# Patient Record
Sex: Male | Born: 1950
Health system: Southern US, Community
[De-identification: ages and names within clinical notes are randomized; demographics above are authoritative.]

## PROBLEM LIST (undated history)

## (undated) DIAGNOSIS — K219 Gastro-esophageal reflux disease without esophagitis: Secondary | ICD-10-CM

## (undated) DIAGNOSIS — C4490 Unspecified malignant neoplasm of skin, unspecified: Secondary | ICD-10-CM

## (undated) DIAGNOSIS — Z8249 Family history of ischemic heart disease and other diseases of the circulatory system: Secondary | ICD-10-CM

## (undated) DIAGNOSIS — K449 Diaphragmatic hernia without obstruction or gangrene: Secondary | ICD-10-CM

## (undated) DIAGNOSIS — K4090 Unilateral inguinal hernia, without obstruction or gangrene, not specified as recurrent: Secondary | ICD-10-CM

## (undated) DIAGNOSIS — C20 Malignant neoplasm of rectum: Secondary | ICD-10-CM

## (undated) HISTORY — DX: Gastro-esophageal reflux disease without esophagitis: K21.9

## (undated) HISTORY — DX: Malignant neoplasm of rectum: C20

## (undated) HISTORY — DX: Unilateral inguinal hernia, without obstruction or gangrene, not specified as recurrent: K40.90

## (undated) HISTORY — DX: Diaphragmatic hernia without obstruction or gangrene: K44.9

## (undated) HISTORY — PX: CLAVICLE SURGERY: SHX598

---
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
## 2012-04-05 ENCOUNTER — Ambulatory Visit (INDEPENDENT_AMBULATORY_CARE_PROVIDER_SITE_OTHER): Payer: Commercial Indemnity | Admitting: Surgery

## 2012-04-05 ENCOUNTER — Encounter (INDEPENDENT_AMBULATORY_CARE_PROVIDER_SITE_OTHER): Payer: Self-pay | Admitting: Surgery

## 2012-04-05 VITALS — BP 142/89 | HR 72 | Temp 97.8°F | Resp 20 | Ht 77.0 in | Wt 213.1 lb

## 2012-04-05 DIAGNOSIS — K4090 Unilateral inguinal hernia, without obstruction or gangrene, not specified as recurrent: Secondary | ICD-10-CM

## 2012-04-05 NOTE — Progress Notes (Signed)
Subjective:     Patient ID: Jermaine Smith, male   DOB: 06-11-1951, 61 y.o.   MRN: [id number]  HPI  Jermaine Smith  [birth year] [id number]  Patient Care Team: Elias Else, MD as PCP - General (Family Medicine) Meryl Dare, MD,FACG as Consulting Physician (Gastroenterology)  This patient is a 61 y.o.male who presents today for surgical evaluation.   Reason for visit: Left inguinal hernia. Margin.  The patient is an active male. He had an inguinal hernia repair on the right side as a child. He is noted some intermittent swelling in his left groin for the past decade. Was told he had a mild hernia on that side. However more recently, he's noticed a constant bulge. It's worse by the end of the day. Reduces when he lies down. Because of more obvious lump and discomfort he wished to see a Careers adviser.   Usually very active. 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  No history of skin infections. He comes today with his wife  Patient Active Problem List  Diagnoses  . Left inguinal hernia    Past Medical History  Diagnosis Date  . Left inguinal hernia     Past Surgical History  Procedure Date  . Hernia repair 1961    open right inguinal age 40 y/o    History   Social History  . Marital Status: Married    Spouse Name: N/A    Number of Children: N/A  . Years of Education: N/A   Occupational History  .      Works on Hydrologist   Social History Main Topics  . Smoking status: Former Smoker    Quit date: 04/05/1977  . Smokeless tobacco: Never Used  . Alcohol Use: Yes     rarely  . Drug Use: No  . Sexually Active: Not on file   Other Topics Concern  . Not on file   Social History Narrative  . No narrative on file    No family history on file.  Current Outpatient Prescriptions  Medication Sig Dispense Refill  . dexlansoprazole (DEXILANT) 60 MG capsule Take 60 mg by mouth daily.         No Known  Allergies  BP 142/89  Pulse 72  Temp(Src) 97.8 F (36.6 C) (Temporal)  Resp 20  Ht 6\' 5"  (1.956 m)  Wt 213 lb 2 oz (96.673 kg)  BMI 25.27 kg/m2  No results found.   Review of Systems  Constitutional: Negative for fever, chills and diaphoresis.  HENT: Negative for nosebleeds, sore throat, facial swelling, mouth sores, trouble swallowing and ear discharge.   Eyes: Negative for photophobia, discharge and visual disturbance.  Respiratory: Negative for choking, chest tightness, shortness of breath and stridor.   Cardiovascular: Negative for chest pain and palpitations.       Can walk 20 miles in a day w/o problems  Gastrointestinal: Negative for nausea, vomiting, abdominal pain, diarrhea, constipation, blood in stool, abdominal distention, anal bleeding and rectal pain.       BM daily.  No personal nor family history of GI/colon cancer, inflammatory bowel disease, irritable bowel syndrome, allergy such as Celiac Sprue, dietary/dairy problems, colitis, ulcers nor gastritis.    No recent sick contacts/gastroenteritis.  No travel outside the country.  No changes in diet.    Genitourinary: Negative for dysuria, urgency, difficulty urinating and testicular pain.  Musculoskeletal: Negative for myalgias, back pain, arthralgias and gait problem.  Skin: Negative for color  change, pallor, rash and wound.  Neurological: Negative for dizziness, speech difficulty, weakness, numbness and headaches.  Hematological: Negative for adenopathy. Does not bruise/bleed easily.  Psychiatric/Behavioral: Negative for hallucinations, confusion and agitation.       Objective:   Physical Exam  Constitutional: He is oriented to person, place, and time. He appears well-developed and well-nourished. No distress.  HENT:  Head: Normocephalic.  Mouth/Throat: Oropharynx is clear and moist. No oropharyngeal exudate.  Eyes: Conjunctivae and EOM are normal. Pupils are equal, round, and reactive to light. No scleral  icterus.  Neck: Normal range of motion. Neck supple. No tracheal deviation present.  Cardiovascular: Normal rate, regular rhythm and intact distal pulses.   Pulmonary/Chest: Effort normal and breath sounds normal. No respiratory distress.  Abdominal: Soft. He exhibits no distension. There is no tenderness. Hernia confirmed negative in the right inguinal area and confirmed negative in the left inguinal area.  Genitourinary:     Musculoskeletal: Normal range of motion. He exhibits no tenderness.  Lymphadenopathy:    He has no cervical adenopathy.       Right: No inguinal adenopathy present.       Left: No inguinal adenopathy present.  Neurological: He is alert and oriented to person, place, and time. No cranial nerve deficit. He exhibits normal muscle tone. Coordination normal.  Skin: Skin is warm and dry. No rash noted. He is not diaphoretic. No erythema. No pallor.  Psychiatric: He has a normal mood and affect. His behavior is normal. Judgment and thought content normal.       Assessment:     LIH    Plan:     Lap LIH repair:  The anatomy & physiology of the abdominal wall and pelvic floor was discussed.  The pathophysiology of hernias in the inguinal and pelvic region was discussed.  Natural history risks such as progressive enlargement, pain, incarceration & strangulation was discussed.   Contributors to complications such as smoking, obesity, diabetes, prior surgery, etc were discussed.    I feel the risks of no intervention will lead to serious problems that outweigh the operative risks; therefore, I recommended surgery to reduce and repair the hernia.  I explained laparoscopic techniques with possible need for an open approach.  I noted usual use of mesh to patch and/or buttress hernia repair  Risks such as bleeding, infection, abscess, need for further treatment, heart attack, death, and other risks were discussed.  I noted a good likelihood this will help address the problem.    Goals of post-operative recovery were discussed as well.  Possibility that this will not correct all symptoms was explained.  I stressed the importance of low-impact activity, aggressive pain control, avoiding constipation, & not pushing through pain to minimize risk of post-operative chronic pain or injury. Possibility of reherniation was discussed.  We will work to minimize complications.     An educational handout further explaining the pathology & treatment options was given as well.  Questions were answered.  The patient expresses understanding & wishes to proceed with surgery.

## 2012-04-05 NOTE — Patient Instructions (Signed)
Inguinal Hernia, Adult  Muscles help keep everything in the body in its proper place. But if a weak spot in the muscles develops, something can poke through. That is called a hernia. When this happens in the lower part of the belly (abdomen), it is called an inguinal hernia. (It takes its name from a part of the body in this region called the inguinal canal.) A weak spot in the wall of muscles lets some fat or part of the small intestine bulge through. An inguinal hernia can develop at any age. Men get them more often than women.  CAUSES   In adults, an inguinal hernia develops over time.  · It can be triggered by:  · Suddenly straining the muscles of the lower abdomen.  · Lifting heavy objects.  · Straining to have a bowel movement. Difficult bowel movements (constipation) can lead to this.  · Constant coughing. This may be caused by smoking or lung disease.  · Being overweight.  · Being pregnant.  · Working at a job that requires long periods of standing or heavy lifting.  · Having had an inguinal hernia before.  One type can be an emergency situation. It is called a strangulated inguinal hernia. It develops if part of the small intestine slips through the weak spot and cannot get back into the abdomen. The blood supply can be cut off. If that happens, part of the intestine may die. This situation requires emergency surgery.  SYMPTOMS   Often, a small inguinal hernia has no symptoms. It is found when a healthcare provider does a physical exam. Larger hernias usually have symptoms.   · In adults, symptoms may include:  · A lump in the groin. This is easier to see when the person is standing. It might disappear when lying down.  · In men, a lump in the scrotum.  · Pain or burning in the groin. This occurs especially when lifting, straining or coughing.  · A dull ache or feeling of pressure in the groin.  · Signs of a strangulated hernia can include:  · A bulge in the groin that becomes very painful and tender to the  touch.  · A bulge that turns red or purple.  · Fever, nausea and vomiting.  · Inability to have a bowel movement or to pass gas.  DIAGNOSIS   To decide if you have an inguinal hernia, a healthcare provider will probably do a physical examination.  · This will include asking questions about any symptoms you have noticed.  · The healthcare provider might feel the groin area and ask you to cough. If an inguinal hernia is felt, the healthcare provider may try to slide it back into the abdomen.  · Usually no other tests are needed.  TREATMENT   Treatments can vary. The size of the hernia makes a difference. Options include:  · Watchful waiting. This is often suggested if the hernia is small and you have had no symptoms.  · No medical procedure will be done unless symptoms develop.  · You will need to watch closely for symptoms. If any occur, contact your healthcare provider right away.  · Surgery. This is used if the hernia is larger or you have symptoms.  · Open surgery. This is usually an outpatient procedure (you will not stay overnight in a hospital). An cut (incision) is made through the skin in the groin. The hernia is put back inside the abdomen. The weak area in the muscles is   then repaired by herniorrhaphy or hernioplasty. Herniorrhaphy: in this type of surgery, the weak muscles are sewn back together. Hernioplasty: a patch or mesh is used to close the weak area in the abdominal wall.  · Laparoscopy. In this procedure, a surgeon makes small incisions. A thin tube with a tiny video camera (called a laparoscope) is put into the abdomen. The surgeon repairs the hernia with mesh by looking with the video camera and using two long instruments.  HOME CARE INSTRUCTIONS   · After surgery to repair an inguinal hernia:  · You will need to take pain medicine prescribed by your healthcare provider. Follow all directions carefully.  · You will need to take care of the wound from the incision.  · Your activity will be  restricted for awhile. This will probably include no heavy lifting for several weeks. You also should not do anything too active for a few weeks. When you can return to work will depend on the type of job that you have.  · During "watchful waiting" periods, you should:  · Maintain a healthy weight.  · Eat a diet high in fiber (fruits, vegetables and whole grains).  · Drink plenty of fluids to avoid constipation. This means drinking enough water and other liquids to keep your urine clear or pale yellow.  · Do not lift heavy objects.  · Do not stand for long periods of time.  · Quit smoking. This should keep you from developing a frequent cough.  SEEK MEDICAL CARE IF:   · A bulge develops in your groin area.  · You feel pain, a burning sensation or pressure in the groin. This might be worse if you are lifting or straining.  · You develop a fever of more than 100.5° F (38.1° C).  SEEK IMMEDIATE MEDICAL CARE IF:   · Pain in the groin increases suddenly.  · A bulge in the groin gets bigger suddenly and does not go down.  · For men, there is sudden pain in the scrotum. Or, the size of the scrotum increases.  · A bulge in the groin area becomes red or purple and is painful to touch.  · You have nausea or vomiting that does not go away.  · You feel your heart beating much faster than normal.  · You cannot have a bowel movement or pass gas.  · You develop a fever of more than 102.0° F (38.9° C).  Document Released: 03/20/2009 Document Revised: 10/21/2011 Document Reviewed: 03/20/2009  Exit[employer]® Patient Information ©2012 [employer].

## 2012-05-09 DIAGNOSIS — K4020 Bilateral inguinal hernia, without obstruction or gangrene, not specified as recurrent: Secondary | ICD-10-CM

## 2012-05-09 HISTORY — PX: OTHER SURGICAL HISTORY: SHX169

## 2012-05-29 ENCOUNTER — Ambulatory Visit (INDEPENDENT_AMBULATORY_CARE_PROVIDER_SITE_OTHER): Payer: Commercial Indemnity | Admitting: Surgery

## 2012-05-29 ENCOUNTER — Encounter (INDEPENDENT_AMBULATORY_CARE_PROVIDER_SITE_OTHER): Payer: Self-pay | Admitting: Surgery

## 2012-05-29 VITALS — BP 142/84 | HR 82 | Resp 16 | Ht 77.0 in | Wt 213.0 lb

## 2012-05-29 DIAGNOSIS — K4090 Unilateral inguinal hernia, without obstruction or gangrene, not specified as recurrent: Secondary | ICD-10-CM

## 2012-05-29 DIAGNOSIS — K4091 Unilateral inguinal hernia, without obstruction or gangrene, recurrent: Secondary | ICD-10-CM

## 2012-05-29 NOTE — Patient Instructions (Signed)

## 2012-05-29 NOTE — Progress Notes (Signed)
Subjective:     Patient ID: Jermaine Smith, male   DOB: [birth year], 61 y.o.   MRN: [id number]  HPI  Jermaine Smith  10-06-1951 [id number]  Patient Care Team: Elias Else, MD as PCP - General (Family Medicine) Meryl Dare, MD,FACG as Consulting Physician (Gastroenterology)  This patient is a 61 y.o.male who presents today for surgical evaluation.   Procedure: Laparoscopic bilateral inguinal hernia repairs 05/09/2012  The patient comes in today feeling well.  He had a small recurrence on the right side in addition to a pantaloon-type left inguinal hernia.  He noted only mild soreness.  Oxycodone worked well for the first few days.  However, he didn't like being on it, so he own weaned himself off.  Back to walking regularly.  Trying to avoid heavy lifting.  Urinating fine.  Moving bowels well.  He is happy that recovery has been rather smooth.  In good spirits  Patient Active Problem List  Diagnosis  . Left inguinal hernia  . Recurrent right inguinal hernia    Past Medical History  Diagnosis Date  . Left inguinal hernia     Past Surgical History  Procedure Date  . Hernia repair 1961    open right inguinal age 61 y/o  . Lap bilateral ing. hernia repair 05/09/12    History   Social History  . Marital Status: Married    Spouse Name: N/A    Number of Children: N/A  . Years of Education: N/A   Occupational History  .      Works on Hydrologist   Social History Main Topics  . Smoking status: Former Smoker    Quit date: 04/05/1977  . Smokeless tobacco: Never Used  . Alcohol Use: Yes     rarely  . Drug Use: No  . Sexually Active: Not on file   Other Topics Concern  . Not on file   Social History Narrative  . No narrative on file    History reviewed. No pertinent family history.  Current Outpatient Prescriptions  Medication Sig Dispense Refill  . dexlansoprazole (DEXILANT) 60 MG capsule Take 60 mg by mouth daily.         No Known  Allergies  BP 142/84  Pulse 82  Resp 16  Ht 6\' 5"  (1.956 m)  Wt 213 lb (96.616 kg)  BMI 25.26 kg/m2  No results found.  Review of Systems  Constitutional: Negative for fever, chills and diaphoresis.  HENT: Negative for sore throat, trouble swallowing and neck pain.   Eyes: Negative for photophobia and visual disturbance.  Respiratory: Negative for choking and shortness of breath.   Cardiovascular: Negative for chest pain and palpitations.  Gastrointestinal: Negative for nausea, vomiting, abdominal distention, anal bleeding and rectal pain.  Genitourinary: Negative for dysuria, urgency, difficulty urinating and testicular pain.  Musculoskeletal: Negative for myalgias, arthralgias and gait problem.  Skin: Negative for color change and rash.  Neurological: Negative for dizziness, speech difficulty, weakness and numbness.  Hematological: Negative for adenopathy.  Psychiatric/Behavioral: Negative for hallucinations, confusion and agitation.       Objective:   Physical Exam  Constitutional: He is oriented to person, place, and time. He appears well-developed and well-nourished. No distress.  HENT:  Head: Normocephalic.  Mouth/Throat: Oropharynx is clear and moist. No oropharyngeal exudate.  Eyes: Conjunctivae and EOM are normal. Pupils are equal, round, and reactive to light. No scleral icterus.  Neck: Normal range of motion. No tracheal deviation present.  Cardiovascular: Normal rate, normal heart  sounds and intact distal pulses.   Pulmonary/Chest: Effort normal. No respiratory distress.  Abdominal: Soft. He exhibits no distension. There is no tenderness. Hernia confirmed negative in the right inguinal area and confirmed negative in the left inguinal area.       Incisions clean with normal healing ridges.  No hernias  Musculoskeletal: Normal range of motion. He exhibits no tenderness.  Neurological: He is alert and oriented to person, place, and time. No cranial nerve deficit. He  exhibits normal muscle tone. Coordination normal.  Skin: Skin is warm and dry. No rash noted. He is not diaphoretic.  Psychiatric: He has a normal mood and affect. His behavior is normal.       Smiling, amiable       Assessment:     Almost 3 weeks s/p lap BIH repairs w mesh, recovering well    Plan:     Increase activity as tolerated.  Do not push through pain.  Advanced on diet as tolerated. Bowel regimen to avoid problems.  Return to clinic p.r.n. The patient expressed understanding and appreciation

## 2015-12-01 ENCOUNTER — Encounter: Payer: Self-pay | Admitting: Gastroenterology

## 2016-01-21 ENCOUNTER — Encounter: Payer: Self-pay | Admitting: Gastroenterology

## 2016-01-21 ENCOUNTER — Ambulatory Visit (INDEPENDENT_AMBULATORY_CARE_PROVIDER_SITE_OTHER): Payer: BLUE CROSS/BLUE SHIELD | Admitting: Gastroenterology

## 2016-01-21 VITALS — BP 136/84 | HR 80 | Ht 76.0 in | Wt 211.2 lb

## 2016-01-21 DIAGNOSIS — K921 Melena: Secondary | ICD-10-CM

## 2016-01-21 MED ORDER — NA SULFATE-K SULFATE-MG SULF 17.5-3.13-1.6 GM/177ML PO SOLN: 1.0000 | Freq: Once | ORAL | Status: DC

## 2016-01-21 NOTE — Progress Notes (Signed)
    History of Present Illness: This is a 65 year old male self referred for the evaluation of rectal bleeding. He is accompanied by his wife. 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 For the past several months the patient has noted intermittent small amounts of bright red blood on the tissue paper or on the stool or in the commode. He relates no change in bowel habits and has no other GI complaints except for occasional mild constipation. He has not previously had colonoscopy. Patient underwent hernia repair in 2013 by Dr. Johney Maine and the patient is concerned about the mesh that was used during surgery. Denies weight loss, abdominal pain, constipation, diarrhea, change in stool caliber, melena, nausea, vomiting, dysphagia, reflux symptoms, chest pain.  Review of Systems: Pertinent positive and negative review of systems were noted in the above HPI section. All other review of systems were otherwise negative.  Current Medications, Allergies, Past Medical History, Past Surgical History, Family History and Social History were reviewed in [employer] record.  Physical Exam: General: Well developed, well nourished, no acute distress Head: Normocephalic and atraumatic Eyes:  sclerae anicteric, EOMI Ears: Normal auditory acuity Mouth: No deformity or lesions Neck: Supple, no masses or thyromegaly Lungs: Clear throughout to auscultation Heart: Regular rate and rhythm; no murmurs, rubs or bruits Abdomen: Soft, non tender and non distended. No masses, hepatosplenomegaly or hernias noted. Normal Bowel sounds Rectal: deffered to colonoscopy  Musculoskeletal: Symmetrical with no gross deformities  Skin: No lesions on visible extremities Pulses:  Normal pulses noted Extremities: No clubbing, cyanosis, edema or deformities noted Neurological: Alert oriented x 4, grossly nonfocal Cervical Nodes:  No significant cervical adenopathy Inguinal Nodes: No significant  inguinal adenopathy Psychological:  Alert and cooperative. Normal mood and affect  Assessment and Recommendations:  1. Hematochezia, small-volume. Suspected benign source such as internal hemorrhoids however colorectal neoplasms and other disorders need to be excluded. Schedule colonoscopy. The risks (including bleeding, perforation, infection, missed lesions, medication reactions and possible hospitalization or surgery if complications occur), benefits, and alternatives to colonoscopy with possible biopsy and possible polypectomy were discussed with the patient and they consent to proceed.   2. Status post inguinal hernia repair with mesh. I advised him to contact Dr. Johney Maine for any questions about this procedure or mesh.

## 2016-01-21 NOTE — Patient Instructions (Signed)
You have been scheduled for a colonoscopy. Please follow written instructions given to you at your visit today.  Please pick up your prep supplies at the pharmacy within the next 1-3 days. If you use inhalers (even only as needed), please bring them with you on the day of your procedure. Your physician has requested that you go to www.startemmi.com and enter the access code given to you at your visit today. This web site gives a general overview about your procedure. However, you should still follow specific instructions given to you by our office regarding your preparation for the procedure.  Thank you for choosing me and [facility].  Pricilla Riffle. Dagoberto Ligas., MD., Marval Regal  cc: Maury Dus, MD

## 2016-02-06 ENCOUNTER — Telehealth: Payer: Self-pay | Admitting: Gastroenterology

## 2016-02-06 NOTE — Telephone Encounter (Signed)
Left a message for patient to return my call. 

## 2016-02-09 NOTE — Telephone Encounter (Signed)
Spoke Gwyndolyn Saxon (patient's wife) and informed her when we get a free sample of Suprep in our office, I will call her and she can come by and pick it up since the procedure isn't until 03/19/16. Pt verbalized understanding.

## 2016-02-09 NOTE — Telephone Encounter (Signed)
Left a message for patient to return my call. 

## 2016-03-16 ENCOUNTER — Telehealth: Payer: Self-pay

## 2016-03-16 NOTE — Telephone Encounter (Signed)
Spoke with patient and told him that I would put a suprep sample up front for him to be picked up.  Patient agreed

## 2016-03-19 ENCOUNTER — Other Ambulatory Visit (INDEPENDENT_AMBULATORY_CARE_PROVIDER_SITE_OTHER): Payer: BLUE CROSS/BLUE SHIELD

## 2016-03-19 ENCOUNTER — Other Ambulatory Visit: Payer: Self-pay | Admitting: *Deleted

## 2016-03-19 ENCOUNTER — Other Ambulatory Visit: Payer: Self-pay

## 2016-03-19 ENCOUNTER — Encounter: Payer: Self-pay | Admitting: Gastroenterology

## 2016-03-19 ENCOUNTER — Ambulatory Visit (AMBULATORY_SURGERY_CENTER): Payer: BLUE CROSS/BLUE SHIELD | Admitting: Gastroenterology

## 2016-03-19 VITALS — BP 134/84 | HR 61 | Temp 98.6°F | Resp 11 | Ht 76.0 in | Wt 211.0 lb

## 2016-03-19 DIAGNOSIS — D125 Benign neoplasm of sigmoid colon: Secondary | ICD-10-CM | POA: Diagnosis not present

## 2016-03-19 DIAGNOSIS — K6289 Other specified diseases of anus and rectum: Secondary | ICD-10-CM

## 2016-03-19 DIAGNOSIS — D6489 Other specified anemias: Secondary | ICD-10-CM

## 2016-03-19 DIAGNOSIS — C20 Malignant neoplasm of rectum: Secondary | ICD-10-CM

## 2016-03-19 DIAGNOSIS — R198 Other specified symptoms and signs involving the digestive system and abdomen: Secondary | ICD-10-CM | POA: Diagnosis not present

## 2016-03-19 DIAGNOSIS — K921 Melena: Secondary | ICD-10-CM | POA: Diagnosis present

## 2016-03-19 DIAGNOSIS — C210 Malignant neoplasm of anus, unspecified: Secondary | ICD-10-CM | POA: Diagnosis not present

## 2016-03-19 LAB — CBC WITH DIFFERENTIAL/PLATELET
Basophils Absolute: 0 K/uL (ref 0.0–0.1)
Basophils Relative: 0.6 % (ref 0.0–3.0)
Eosinophils Absolute: 0.1 K/uL (ref 0.0–0.7)
Eosinophils Relative: 1.4 % (ref 0.0–5.0)
HCT: 35 % — ABNORMAL LOW (ref 39.0–52.0)
Hemoglobin: 11.9 g/dL — ABNORMAL LOW (ref 13.0–17.0)
Lymphocytes Relative: 16.5 % (ref 12.0–46.0)
Lymphs Abs: 0.9 K/uL (ref 0.7–4.0)
MCHC: 34.1 g/dL (ref 30.0–36.0)
MCV: 79.4 fl (ref 78.0–100.0)
Monocytes Absolute: 0.6 K/uL (ref 0.1–1.0)
Monocytes Relative: 10.4 % (ref 3.0–12.0)
Neutro Abs: 3.8 K/uL (ref 1.4–7.7)
Neutrophils Relative %: 71.1 % (ref 43.0–77.0)
Platelets: 207 K/uL (ref 150.0–400.0)
RBC: 4.4 Mil/uL (ref 4.22–5.81)
RDW: 13.8 % (ref 11.5–15.5)
WBC: 5.4 K/uL (ref 4.0–10.5)

## 2016-03-19 LAB — IBC PANEL
Iron: 44 ug/dL (ref 42–165)
Saturation Ratios: 10.4 % — ABNORMAL LOW (ref 20.0–50.0)
Transferrin: 303 mg/dL (ref 212.0–360.0)

## 2016-03-19 LAB — FERRITIN: FERRITIN: 7 ng/mL — AB (ref 22.0–322.0)

## 2016-03-19 LAB — COMPREHENSIVE METABOLIC PANEL WITH GFR
ALT: 14 U/L (ref 0–53)
AST: 18 U/L (ref 0–37)
Albumin: 4.1 g/dL (ref 3.5–5.2)
Alkaline Phosphatase: 68 U/L (ref 39–117)
BUN: 14 mg/dL (ref 6–23)
CO2: 28 meq/L (ref 19–32)
Calcium: 9.5 mg/dL (ref 8.4–10.5)
Chloride: 105 meq/L (ref 96–112)
Creatinine, Ser: 1.12 mg/dL (ref 0.40–1.50)
GFR: 70.21 mL/min
Glucose, Bld: 93 mg/dL (ref 70–99)
Potassium: 4.6 meq/L (ref 3.5–5.1)
Sodium: 140 meq/L (ref 135–145)
Total Bilirubin: 0.9 mg/dL (ref 0.2–1.2)
Total Protein: 6.7 g/dL (ref 6.0–8.3)

## 2016-03-19 LAB — FOLATE: Folate: 16.5 ng/mL (ref >5.9)

## 2016-03-19 LAB — VITAMIN B12: Vitamin B-12: 259 pg/mL (ref 211–911)

## 2016-03-19 MED ORDER — SODIUM CHLORIDE 0.9 % IV SOLN: 500.0000 mL | INTRAVENOUS | Status: DC

## 2016-03-19 NOTE — Patient Instructions (Signed)
YOU HAD AN ENDOSCOPIC PROCEDURE TODAY AT [facility]:   Refer to the procedure report that was given to you for any specific questions about what was found during the examination.  If the procedure report does not answer your questions, please call your gastroenterologist to clarify.  If you requested that your care partner not be given the details of your procedure findings, then the procedure report has been included in a sealed envelope for you to review at your convenience later.  YOU SHOULD EXPECT: Some feelings of bloating in the abdomen. Passage of more gas than usual.  Walking can help get rid of the air that was put into your GI tract during the procedure and reduce the bloating. If you had a lower endoscopy (such as a colonoscopy or flexible sigmoidoscopy) you may notice spotting of blood in your stool or on the toilet paper. If you underwent a bowel prep for your procedure, you may not have a normal bowel movement for a few days.  Please Note:  You might notice some irritation and congestion in your nose or some drainage.  This is from the oxygen used during your procedure.  There is no need for concern and it should clear up in a day or so.  SYMPTOMS TO REPORT IMMEDIATELY:   Following lower endoscopy (colonoscopy or flexible sigmoidoscopy):  Excessive amounts of blood in the stool  Significant tenderness or worsening of abdominal pains  Swelling of the abdomen that is new, acute  Fever of 100F or higher   For urgent or emergent issues, a gastroenterologist can be reached at any hour by calling [phone].   DIET: Your first meal following the procedure should be a small meal and then it is ok to progress to your normal diet. Heavy or fried foods are harder to digest and may make you feel nauseous or bloated.  Likewise, meals heavy in dairy and vegetables can increase bloating.  Drink plenty of fluids but you should avoid alcoholic beverages for 24  hours.  ACTIVITY:  You should plan to take it easy for the rest of today and you should NOT DRIVE or use heavy machinery until tomorrow (because of the sedation medicines used during the test).    FOLLOW UP: Our staff will call the number listed on your records the next business day following your procedure to check on you and address any questions or concerns that you may have regarding the information given to you following your procedure. If we do not reach you, we will leave a message.  However, if you are feeling well and you are not experiencing any problems, there is no need to return our call.  We will assume that you have returned to your regular daily activities without incident.  If any biopsies were taken you will be contacted by phone or by letter within the next 1-3 weeks.  Please call us at [phone] if you have not heard about the biopsies in 3 weeks.    SIGNATURES/CONFIDENTIALITY: You and/or your care partner have signed paperwork which will be entered into your electronic medical record.  These signatures attest to the fact that that the information above on your After Visit Summary has been reviewed and is understood.  Full responsibility of the confidentiality of this discharge information lies with you and/or your care-partner.  Polyp, hemorrhoid information given.  Contrast given for CT scan.  Dr. Lynne Leader nurse will be in touch about scheduling.  Labs today -CMP,  CBC, CEA  Dr. Lynne Leader office will get you a feferral to oncologist.

## 2016-03-19 NOTE — Progress Notes (Signed)
Per procedure report today patient needs scheduled for CT scan abd/pelvis, appt with [facility], and oncology referral. CT scan is scheduled for 03/23/16 1:30.  He verbalized understanding of CT instructions [facility] referral is scheduled with Dr. Marcello Moores for 03/30/1709:00 arrival He is aware that he will be contacted directly by Oncology next week with an apt

## 2016-03-19 NOTE — Progress Notes (Signed)
Report to PACU, RN, vss, BBS= Clear.  

## 2016-03-19 NOTE — Progress Notes (Signed)
Called to room to assist during endoscopic procedure.  Patient ID and intended procedure confirmed with present staff. Received instructions for my participation in the procedure from the performing physician.  

## 2016-03-19 NOTE — Op Note (Signed)
[facility] Patient Name: Jermaine Smith Procedure Date: 03/19/2016 9:10 AM MRN: [id number] Endoscopist: Ladene Artist , MD Age: 65 Date of Birth: [birth year] Gender: Male Procedure:                Colonoscopy Indications:              Evaluation of unexplained GI bleeding, Hematochezia Medicines:                Monitored Anesthesia Care Procedure:                Pre-Anesthesia Assessment:                           - Prior to the procedure, a History and Physical                            was performed, and patient medications and                            allergies were reviewed. The patient's tolerance of                            previous anesthesia was also reviewed. The risks                            and benefits of the procedure and the sedation                            options and risks were discussed with the patient.                            All questions were answered, and informed consent                            was obtained. Prior Anticoagulants: The patient has                            taken no previous anticoagulant or antiplatelet                            agents. ASA Grade Assessment: II - A patient with                            mild systemic disease. After reviewing the risks                            and benefits, the patient was deemed in                            satisfactory condition to undergo the procedure.                           After obtaining informed consent, the colonoscope  was passed under direct vision. Throughout the                            procedure, the patient's blood pressure, pulse, and                            oxygen saturations were monitored continuously. The                            Model PCF-[id number]) scope was introduced                            through the anus and advanced to the the cecum,                            identified by appendiceal orifice and ileocecal                        valve. The colonoscopy was performed without                            difficulty. The patient tolerated the procedure                            well. The quality of the bowel preparation was                            excellent. The ileocecal valve, appendiceal                            orifice, and rectum were photographed. Scope In: 9:20:25 AM Scope Out: 9:39:03 AM Scope Withdrawal Time: 0 hours 14 minutes 41 seconds  Total Procedure Duration: 0 hours 18 minutes 38 seconds  Findings:                 The digital rectal exam was normal.                           A fungating partially obstructing mass was found in                            the proximal rectum. The mass was partially                            circumferential (involving two-thirds of the lumen                            circumference). The mass measured six cm in length                            by 4 cm in width. It extended from 10-16 cm from                            the anal verge. Oozing was present. This was  biopsied with a cold forceps for histology.                           A 10 mm polyp was found in the sigmoid colon. The                            polyp was semi-pedunculated. The polyp was removed                            with a hot snare. Resection and retrieval were                            complete.                           Internal hemorrhoids were found during                            retroflexion. The hemorrhoids were small and Grade                            I (internal hemorrhoids that do not prolapse).                           The exam was otherwise normal throughout the                            examined colon. Complications:            No immediate complications. Estimated Blood Loss:     Estimated blood loss was minimal. Impression:               - Malignant partially obstructing tumor in the                            proximal rectum.  Biopsied.                           - One 10 mm polyp in the sigmoid colon, removed                            with a hot snare. Resected and retrieved.                           - Internal hemorrhoids. Recommendation:           - Patient has a contact number available for                            emergencies. The signs and symptoms of potential                            delayed complications were discussed with the                            patient. Return to normal activities tomorrow.  Written discharge instructions were provided to the                            patient.                           - Resume previous diet.                           - Continue present medications.                           - Await pathology results.                           - Repeat colonoscopy in 1 year for surveillance.                           - Refer to an oncologist at the next available                            appointment.                           - Perform CT scan (computed tomography) of the                            abdomen & pelvis with contrast at the next                            available appointment.                           - CBC, CMP, CEA today Ladene Artist, MD 03/19/2016 9:46:51 AM This report has been signed electronically.

## 2016-03-20 LAB — CEA: CEA: 1.1 ng/mL

## 2016-03-22 ENCOUNTER — Telehealth: Payer: Self-pay

## 2016-03-22 NOTE — Telephone Encounter (Signed)
  Follow up Call-  Call back number 03/19/2016  Post procedure Call Back phone  # [phone]  Permission to leave phone message Yes     Patient questions:  Do you have a fever, pain , or abdominal swelling? No. Pain Score  0 *  Have you tolerated food without any problems? Yes.    Have you been able to return to your normal activities? Yes.    Do you have any questions about your discharge instructions: Diet   No. Medications  No. Follow up visit  No.  Do you have questions or concerns about your Care? No.  Actions: * If pain score is 4 or above: No action needed, pain <4.

## 2016-03-23 ENCOUNTER — Telehealth: Payer: Self-pay | Admitting: *Deleted

## 2016-03-23 ENCOUNTER — Ambulatory Visit (INDEPENDENT_AMBULATORY_CARE_PROVIDER_SITE_OTHER)
Admission: RE | Admit: 2016-03-23 | Discharge: 2016-03-23 | Disposition: A | Discharge status: Home / Self Care | Payer: BLUE CROSS/BLUE SHIELD | Source: Ambulatory Visit | Attending: Gastroenterology | Admitting: Gastroenterology

## 2016-03-23 DIAGNOSIS — C20 Malignant neoplasm of rectum: Secondary | ICD-10-CM | POA: Diagnosis not present

## 2016-03-23 MED ORDER — IOPAMIDOL (ISOVUE-300) INJECTION 61%
100.0000 mL | Freq: Once | INTRAVENOUS | Status: AC | PRN
  Administered 2016-03-23: 100 mL via INTRAVENOUS

## 2016-03-23 NOTE — Telephone Encounter (Signed)
Wife left VM at 3:30. Attempted return call and left message on home machine again to call regarding appointment.

## 2016-03-23 NOTE — Telephone Encounter (Signed)
Oncology Nurse Navigator Documentation  Oncology Nurse Navigator Flowsheets 03/23/2016  Navigator Location [facility]  Navigator Encounter Type Introductory phone call  Left VM for patient to return call regarding new patient appointment.

## 2016-03-24 ENCOUNTER — Other Ambulatory Visit: Payer: Self-pay

## 2016-03-24 ENCOUNTER — Telehealth: Payer: Self-pay | Admitting: *Deleted

## 2016-03-24 ENCOUNTER — Encounter: Payer: Self-pay | Admitting: Hematology

## 2016-03-24 ENCOUNTER — Telehealth: Payer: Self-pay

## 2016-03-24 DIAGNOSIS — C20 Malignant neoplasm of rectum: Secondary | ICD-10-CM

## 2016-03-24 NOTE — Telephone Encounter (Signed)
Oncology Nurse Navigator Documentation  Oncology Nurse Navigator Flowsheets 03/24/2016  Navigator Location [facility]  Navigator Encounter Type Introductory phone call  Spoke with patient and provided new patient appointment for 04/02/16 in GI [place], seeing Dr. Burr Medico at [place] and Dr. Lisbeth Renshaw at 0930. Instructed to arrive at 0815. Informed of location of [facility], valet service, and registration process. Reminded to bring insurance cards and a current medication list, including supplements. Patient verbalizes understanding. He reports that Dr. Fuller Plan is getting him scheduled for lower EUS this week or next week-informed him this is good, we would have requested this to be done. Notified HIM and radiation oncology scheduler to enter appointment into EPIC. Notified physical therapy.

## 2016-03-24 NOTE — Telephone Encounter (Signed)
-----   Message from Milus Banister, MD sent at 03/24/2016  7:15 AM EDT ----- The onc team is probably going to want EUS staging (usually if CT shows suggestive local nodes but no liver, lung masses the preference is to EUS stage).  If you can let him know, I'll have Faizah Kandler get in touch with him and can add on to schedule tomorrow (only takes moderate sedation so don't need MAC).  [employer], See above.  This man needs lower EUS radial, moderate sedation, tomorrow if possible, next Thursday if not.  For newly diagnosed rectal adenocarcinoma.  Thanks  dj  ----- Message -----    From: Ladene Artist, MD    Sent: 03/23/2016   4:20 PM      To: Milus Banister, MD  Linna Hoff,  Do you think a rectal EUS is indicated for this patient? See colonoscopy and CT reports. Pathology is adenocarcinoma. Pt has appt with Dr. Marcello Moores on 5/18.   Thanks,   Norberto Sorenson

## 2016-03-24 NOTE — Telephone Encounter (Signed)
EUS scheduled, pt instructed and medications reviewed.  Patient instructions mailed to home.  Patient to call with any questions or concerns.  

## 2016-03-25 ENCOUNTER — Telehealth: Payer: Self-pay

## 2016-03-25 ENCOUNTER — Encounter (HOSPITAL_COMMUNITY)
Admission: RE | Disposition: A | Discharge status: Home / Self Care | Payer: Self-pay | Source: Ambulatory Visit | Attending: Gastroenterology

## 2016-03-25 ENCOUNTER — Ambulatory Visit (HOSPITAL_COMMUNITY)
Admission: RE | Admit: 2016-03-25 | Discharge: 2016-03-25 | Disposition: A | Discharge status: Home / Self Care | Payer: BLUE CROSS/BLUE SHIELD | Source: Ambulatory Visit | Attending: Gastroenterology | Admitting: Gastroenterology

## 2016-03-25 ENCOUNTER — Encounter (HOSPITAL_COMMUNITY): Payer: Self-pay

## 2016-03-25 DIAGNOSIS — C20 Malignant neoplasm of rectum: Secondary | ICD-10-CM | POA: Diagnosis not present

## 2016-03-25 DIAGNOSIS — Z87891 Personal history of nicotine dependence: Secondary | ICD-10-CM | POA: Insufficient documentation

## 2016-03-25 DIAGNOSIS — K6289 Other specified diseases of anus and rectum: Secondary | ICD-10-CM | POA: Diagnosis not present

## 2016-03-25 DIAGNOSIS — C218 Malignant neoplasm of overlapping sites of rectum, anus and anal canal: Secondary | ICD-10-CM | POA: Diagnosis not present

## 2016-03-25 HISTORY — PX: EUS: SHX5427

## 2016-03-25 SURGERY — ULTRASOUND, LOWER GI TRACT, ENDOSCOPIC
Anesthesia: Moderate Sedation

## 2016-03-25 MED ORDER — FENTANYL CITRATE (PF) 100 MCG/2ML IJ SOLN
INTRAMUSCULAR | Status: DC | PRN
  Administered 2016-03-25 (×3): 25 ug via INTRAVENOUS

## 2016-03-25 MED ORDER — MIDAZOLAM HCL 10 MG/2ML IJ SOLN
INTRAMUSCULAR | Status: DC | PRN
  Administered 2016-03-25: 1 mg via INTRAVENOUS
  Administered 2016-03-25 (×2): 2 mg via INTRAVENOUS

## 2016-03-25 MED ORDER — MIDAZOLAM HCL 5 MG/ML IJ SOLN
INTRAMUSCULAR | Status: AC
  Filled 2016-03-25: qty 2

## 2016-03-25 MED ORDER — SPOT INK MARKER SYRINGE KIT
PACK | SUBMUCOSAL | Status: AC
  Filled 2016-03-25: qty 5

## 2016-03-25 MED ORDER — SODIUM CHLORIDE 0.9 % IV SOLN: INTRAVENOUS | Status: DC

## 2016-03-25 MED ORDER — FENTANYL CITRATE (PF) 100 MCG/2ML IJ SOLN
INTRAMUSCULAR | Status: AC
  Filled 2016-03-25: qty 2

## 2016-03-25 MED ORDER — SPOT INK MARKER SYRINGE KIT
PACK | SUBMUCOSAL | Status: DC | PRN
  Administered 2016-03-25: 3 mL via SUBMUCOSAL

## 2016-03-25 NOTE — Telephone Encounter (Signed)
-----   Message from Milus Banister, MD sent at 03/25/2016 12:02 PM EDT ----- All,  Just completed EUS staging: see full report in EPIC.  5cm long, non-circumferential uT3N1 (stage IIIb) proximal rectal adenocarcinoma with distal edge located 9-10cm from the anal verge. Following EUS staging, the mass was labeled with submucosal injection of SPOT.  HE is set with appts next week with Derrick Ravel, [place].  Kazmir Oki, He needs staging CT scan of the chest (IV contrast) for newly diagnosed rectal adenocarcinoma. Thanks

## 2016-03-25 NOTE — Op Note (Signed)
[name] Procedure Date: 03/25/2016 MRN: [id number] Attending MD: Milus Banister , MD Date of Birth: 04/29/1951 CSN: [id number] Age: 65 Admit Type: Outpatient Procedure:                Lower EUS Indications:              Pre-treatment staging for proximal rectal                            adenocarcinoma; no clear distant disease on CT                            abd/pelvis Providers:                Milus Banister, MD, Laverta Baltimore, RN, Alfonso Patten, Technician Referring MD:             Lucio Edward, MD Medicines:                Fentanyl 75 micrograms IV, Midazolam 5 mg IV Complications:            No immediate complications. Estimated blood loss:                            None. Estimated Blood Loss:     Estimated blood loss: none. Procedure:                Pre-Anesthesia Assessment:                           - Prior to the procedure, a History and Physical                            was performed, and patient medications and                            allergies were reviewed. The patient's tolerance of                            previous anesthesia was also reviewed. The risks                            and benefits of the procedure and the sedation                            options and risks were discussed with the patient.                            All questions were answered, and informed consent                            was obtained. Prior Anticoagulants: The patient has  taken no previous anticoagulant or antiplatelet                            agents. ASA Grade Assessment: II - A patient with                            mild systemic disease. After reviewing the risks                            and benefits, the patient was deemed in                            satisfactory condition to undergo the procedure.                           After obtaining informed consent, the  endoscope was                            passed under direct vision. Throughout the                            procedure, the patient's blood pressure, pulse, and                            oxygen saturations were monitored continuously. The                            [id number]) scope was introduced through                            the anus and advanced to the the sigmoid colon for                            ultrasound. The lower EUS was accomplished without                            difficulty. The patient tolerated the procedure                            well. The quality of the bowel preparation was good. Scope In: Scope Out: Findings:      Sigmoidoscopic findings:      1. Clearly malignant, non-circumferential mass in the proximal rectum.       The distal edge of the mass is located 9-10cm from the anal verge and       the mass was 5cm long.      2. Following EUS evaluation, the distal edge of the mass was labeled       with submucosal injection of SPOT in two locations.      Endosonographic Finding :      1. The mass above correlated with a hyoechoic mass that clearly invades       into and through the muscularis propria layer of the rectal wall (uT3).      2. The was one small (18mm) suspicious perirectal lymphnode that was  suspicious for malignant involvement (uN1)      3. The right obturator lymphnode that was described on recent CT scan       was not visible on this exam Impression:               5cm long, non-circumferential uT3N1 (stage IIIb)                            proximal rectal adenocarcinoma with distal edge                            located 9-10cm from the anal verge. Following EUS                            staging, the mass was labeled with submucosal                            injection of SPOT. Moderate Sedation:      Moderate (conscious) sedation was administered by the endoscopy nurse       and supervised by the endoscopist. The following  parameters were       monitored: oxygen saturation, heart rate, blood pressure, and response       to care. Total physician intraservice time was 25 minutes. Recommendation:           - Discharge patient to home (ambulatory).                           - He is already set to meet Drs. Fredda Hammed and                            [place] next week.                           - Will arrange staging CT scan of the chest (with                            IV contrast). Procedure Code(s):        --- Professional ---                           [phone], Sigmoidoscopy, flexible; with endoscopic                            ultrasound examination                           [id number], Sigmoidoscopy, flexible; with directed                            submucosal injection(s), any substance                           [id number], Moderate sedation services provided by the                            same physician or other qualified health care  professional performing the diagnostic or                            therapeutic service that the sedation supports,                            requiring the presence of an independent trained                            observer to assist in the monitoring of the                            patient's level of consciousness and physiological                            status; initial 15 minutes of intraservice time,                            patient age 87 years or older                           [phone], Moderate sedation services; each additional                            15 minutes intraservice time Diagnosis Code(s):        --- Professional ---                           K62.89, Other specified diseases of anus and rectum                           C21.8, Malignant neoplasm of overlapping sites of                            rectum, anus and anal canal CPT copyright 2016 American Medical Association. All rights reserved. The codes documented in this report are  preliminary and upon coder review may  be revised to meet current compliance requirements. Milus Banister, MD 03/25/2016 12:01:23 PM This report has been signed electronically. Number of Addenda: 0

## 2016-03-25 NOTE — Discharge Instructions (Signed)

## 2016-03-25 NOTE — H&P (Signed)
  HPI: This is a man with newly diagnosed rectal adenocarcinoma.   Chief complaint is rectal cancer  No sign of mets on CT abd/pelvis   Past Medical History  Diagnosis Date  . Left inguinal hernia   . Hiatal hernia   . GERD (gastroesophageal reflux disease)     Past Surgical History  Procedure Laterality Date  . Inguinal hernia repair  1961    open right inguinal age 65 y/o  . Lap bilateral ing. hernia repair Bilateral 05/09/12    Current Facility-Administered Medications  Medication Dose Route Frequency Provider Last Rate Last Dose  . 0.9 %  sodium chloride infusion   Intravenous Continuous Milus Banister, MD        Allergies as of 03/24/2016  . (No Known Allergies)    Family History  Problem Relation Age of Onset  . Heart disease Father   . Clotting disorder Mother     PE  . Hypotension Mother   . Colon cancer Neg Hx     Social History   Social History  . Marital Status: Married    Spouse Name: N/A  . Number of Children: 2  . Years of Education: N/A   Occupational History  . endoscope specialist     Works on [employer]  . Smoking status: Former Smoker    Types: Cigarettes    Quit date: 04/05/1977  . Smokeless tobacco: Never Used  . Alcohol Use: 0.0 oz/week    0 Standard drinks or equivalent per week     Comment: ocassional  . Drug Use: No  . Sexual Activity: Not on file   Other Topics Concern  . Not on file   Social History Narrative     Physical Exam: There were no vitals taken for this visit. Constitutional: generally well-appearing Psychiatric: alert and oriented x3 Abdomen: soft, nontender, nondistended, no obvious ascites, no peritoneal signs, normal bowel sounds   Assessment and plan: 65 y.o. male with rectal adenocarcinoma  No sign of mets on ct abd/pelvis  Planning for EUS staging today.   Owens Loffler, MD [facility] 03/25/2016, 10:47 AM

## 2016-03-25 NOTE — Telephone Encounter (Signed)
The pt has been set up for CT chest for tomorrow 03/26/16 arrive 8:45 am NPO 2 hours I have reviewed the provider's instructions with the patient, answering all questions to his satisfaction.

## 2016-03-26 ENCOUNTER — Ambulatory Visit (INDEPENDENT_AMBULATORY_CARE_PROVIDER_SITE_OTHER)
Admission: RE | Admit: 2016-03-26 | Discharge: 2016-03-26 | Disposition: A | Discharge status: Home / Self Care | Payer: BLUE CROSS/BLUE SHIELD | Source: Ambulatory Visit | Attending: Gastroenterology | Admitting: Gastroenterology

## 2016-03-26 DIAGNOSIS — C20 Malignant neoplasm of rectum: Secondary | ICD-10-CM

## 2016-03-26 MED ORDER — IOPAMIDOL (ISOVUE-300) INJECTION 61%
80.0000 mL | Freq: Once | INTRAVENOUS | Status: AC | PRN
Start: 2016-03-26 — End: 2016-03-26
  Administered 2016-03-26: 80 mL via INTRAVENOUS

## 2016-04-02 ENCOUNTER — Ambulatory Visit: Payer: BLUE CROSS/BLUE SHIELD | Admitting: Nutrition

## 2016-04-02 ENCOUNTER — Ambulatory Visit: Payer: BLUE CROSS/BLUE SHIELD | Attending: Hematology | Admitting: Physical Therapy

## 2016-04-02 ENCOUNTER — Encounter: Payer: Self-pay | Admitting: Radiation Oncology

## 2016-04-02 ENCOUNTER — Encounter: Payer: Self-pay | Admitting: *Deleted

## 2016-04-02 ENCOUNTER — Telehealth: Payer: Self-pay | Admitting: Pharmacist

## 2016-04-02 ENCOUNTER — Ambulatory Visit
Admission: RE | Admit: 2016-04-02 | Discharge: 2016-04-02 | Disposition: A | Discharge status: Home / Self Care | Payer: BLUE CROSS/BLUE SHIELD | Source: Ambulatory Visit | Attending: Radiation Oncology | Admitting: Radiation Oncology

## 2016-04-02 ENCOUNTER — Ambulatory Visit (HOSPITAL_BASED_OUTPATIENT_CLINIC_OR_DEPARTMENT_OTHER): Payer: BLUE CROSS/BLUE SHIELD | Admitting: Hematology

## 2016-04-02 ENCOUNTER — Encounter: Payer: Self-pay | Admitting: Hematology

## 2016-04-02 VITALS — BP 140/57 | HR 63 | Temp 97.6°F | Resp 18 | Ht 76.0 in | Wt 205.8 lb

## 2016-04-02 DIAGNOSIS — C20 Malignant neoplasm of rectum: Secondary | ICD-10-CM | POA: Diagnosis present

## 2016-04-02 DIAGNOSIS — D509 Iron deficiency anemia, unspecified: Secondary | ICD-10-CM | POA: Diagnosis not present

## 2016-04-02 MED ORDER — CAPECITABINE 150 MG PO TABS: 300.0000 mg | ORAL_TABLET | Freq: Two times a day (BID) | ORAL | Status: DC

## 2016-04-02 MED ORDER — CAPECITABINE 500 MG PO TABS: 1500.0000 mg | ORAL_TABLET | Freq: Two times a day (BID) | ORAL | Status: DC

## 2016-04-02 NOTE — Patient Instructions (Signed)
   Care Plan Summary- 04/02/2016 Name:  Jermaine Smith        DOB:  1951-07-06 Your Medical Team: Medical Oncologist:  Dr. Truitt Merle Radiation Oncologist:  Dr. Kyung Rudd Surgeon:   Dr. Leighton Ruff Type of Cancer: Adenocarcinoma of Rectum  Stage/Grade: uT3-Stage III *Exact staging of your cancer is based on size of the tumor, depth of invasion, involvement of lymph nodes or not, and whether or not the cancer has spread beyond the primary site   Recommendations: Based on information available as of today's consult. Recommendations may change depending on the results of further tests or exams. 1) Radiation therapy M-F X 6 weeks with oral Xeloda M-F for 6 weeks 2) Surgery will follow the neoadjuvant chemo/RT 3)  Next Steps: 1) RT simulation next week with plans to begin treatment on 04/14/16 2) Chemotherapy education class 04/07/16 at 10:00 3)  ______________________________________________________________________________   Questions? Merceda Elks, RN, BSN at [phone]. Manuela Schwartz is your Oncology Nurse Navigator and is available to assist you while you're receiving your medical care at [facility].

## 2016-04-02 NOTE — Progress Notes (Signed)
Patient was seen in GI clinic.  65 year old man diagnosed with rectal cancer.  He is a patient of Dr. Burr Medico.  Past medical history includes hernia and GERD.  Medications include multivitamin, Dexalant  vitamin D.  Labs were reviewed.  Height: 6 feet 4 inches. Weight: 205.8 pounds. Usual body weight: 210 pounds. BMI: 25.06.  Patient denies nutrition impact symptoms. Patient does endorse 5 pound weight loss but states this is because of the stress of diagnosis. Patient eats a variety of foods.  Nutrition diagnosis:  Food and nutrition related knowledge deficit related to new diagnosis of rectal cancer and associated treatments as evidenced by no prior need for nutrition related information.  Intervention: Patient was educated to consume adequate calories and protein in small frequent meals and snacks throughout the day. Provided fact sheet on increasing calories and protein. Reviewed potential side effects and dietary strategies. Questions were answered.  Teach back method used.  Contact information was provided.  Monitoring, evaluation, goals: Patient will tolerate adequate calories and protein to minimize weight loss throughout treatment.  Next visit: To be scheduled as needed.  **Disclaimer: This note was dictated with voice recognition software. Similar sounding words can inadvertently be transcribed and this note may contain transcription errors which may not have been corrected upon publication of note.**

## 2016-04-02 NOTE — Patient Instructions (Signed)
Skin Care and Bowel Hygiene  Anyone who has frequent bowel movements, diarrhea, or bowel leakage (fecal incontinence) may experience soreness or skin irritation around the anal region.  Occasionally, the skin can become so inflamed that it breaks into open sores.  Prevent skin breakdown by following good skin care habits.  Cleaning and Washing Techniques After having a bowel movement, men and women should tighten their anal sphincter before wiping.  Women should always wipe from front to back to prevent fecal matter from getting into the urethra and vagina.   Tips for Cleaning and Washing . wipe from front to back towards the anus . always wipe gently with soft toilet paper, or ideally with moist toilet paper . wipe only once with each piece of toilet paper so as not to re-contaminate the area . wash in warm water alone or with a minimal amount of mild, fragrance-free soap . use non-biological washing powder . gently pat skin completely dry, avoiding rubbing . if drying the skin after washing is difficult or uncomfortable, try using a hairdryer on a low setting (use very carefully) . allow air to get to the irritated area for some part of every day . use protective skin creams containing zinc as recommended by your doctor  What To Avoid . baths with extra-hot water . soaking for long periods of time in the bathtub . disinfectants and antiseptics  . bath oils, bath salts, and talcum powder . using plastic pants, pads, and sheets, which cause sweating . scratching at the irritated area  Additional Tips . some people find that citrus and acidic foods cause or worsen skin irritation . eat a healthy, balanced diet that is high in fiber  . drink plenty of fluids . wear cotton underwear to allow the skin to breath . talk to your healthcare provider about further treatment options; persistent problems need medical attention  Earlie Counts, PT [place], [place] [address]      [phone]   Tips for Energy Conservation for Activities of Daily Living . Plan ahead to avoid rushing. . Sit down to bathe and dry off. Wear a terry robe instead of drying off. . Use a shower/bath organizer to decrease leaning and reaching. . Use extension handles on sponges and brushes. Susa Simmonds grab rails in the bathroom or use an elevated toilet seat. Hoyle Barr out clothes and toiletries before dressing. . Minimize leaning over to put on clothes and shoes. Bring your foot to your knee to apply socks and shoes. . Wear comfortable shoes and low-heeled, slip on shoes. Wear button front shirts rather than pullovers. Housekeeping . Schedule household tasks throughout the week. . Do housework sitting down when possible. . Delegate heavy housework, shopping, laundry and child care when possible. . Drag or slide objects rather than lifting. . Sit when ironing and take rest periods. . Stop working before becoming overly tired. Shopping . Organize list by aisle. . Use a grocery cart for support. Marland Kitchen Shop at less busy times. . Ask for help with getting to the car. Meal Preparation . Use convenience and easy-to-prepare foods. . Use small appliances that take less effort to use. Marland Kitchen Prepare meals sitting down. . Soak dishes instead of scrubbing and let dishes air dry. . Prepare double portions and freeze half. Child Care . Plan activities that can be done sitting down, such as drawing pictures, playing games, reading, and computer games. . Encourage children to climb up onto your lap or into the  highchair instead of being lifted. . Make a game of the household chores so that children will want to help. . Delegate child care when possible.  Earlie Counts, PT, [facility]; [place], [place], [place] [address] Toileting Techniques for Bowel Movements (Defecation) Using your belly (abdomen) and pelvic floor muscles to have a bowel movement is  usually instinctive.  Sometimes people can have problems with these muscles and have to relearn proper defecation (emptying) techniques.  If you have weakness in your muscles, organs that are falling out, decreased sensation in your pelvis, or ignore your urge to go, you may find yourself straining to have a bowel movement.  You are straining if you are: . holding your breath or taking in a huge gulp of air and holding it  . keeping your lips and jaw tensed and closed tightly . turning red in the face because of excessive pushing or forcing . developing or worsening your  hemorrhoids . getting faint while pushing . not emptying completely and have to defecate many times a day  If you are straining, you are actually making it harder for yourself to have a bowel movement.  Many people find they are pulling up with the pelvic floor muscles and closing off instead of opening the anus. Due to lack pelvic floor relaxation and coordination the abdominal muscles, one has to work harder to push the feces out.  Many people have never been taught how to defecate efficiently and effectively.  Notice what happens to your body when you are having a bowel movement.  While you are sitting on the toilet pay attention to the following areas: . Jaw and mouth position . Angle of your hips   . Whether your feet touch the ground or not . Arm placement  . Spine position . Waist . Belly tension . Anus (opening of the anal canal)  An Evacuation/Defecation Plan   Here are the 4 basic points:  1. Lean forward enough for your elbows to rest on your knees 2. Support your feet on the floor or use a low stool if your feet don't touch the floor  3. Push out your belly as if you have swallowed a beach ball-you should feel a widening of your waist 4. Open and relax your pelvic floor muscles, rather than tightening around the anus      The following conditions my require modifications to your toileting posture:  . If  you have had surgery in the past that limits your back, hip, pelvic, knee or ankle flexibility . Constipation   Your healthcare practitioner may make the following additional suggestions and adjustments:  1) Sit on the toilet  a) Make sure your feet are supported. b) Notice your hip angle and spine position-most people find it effective to lean forward or raise their knees, which can help the muscles around the anus to relax  c) When you lean forward, place your forearms on your thighs for support  2) Relax suggestions a) Breath deeply in through your nose and out slowly through your mouth as if you are smelling the flowers and blowing out the candles. b) To become aware of how to relax your muscles, contracting and releasing muscles can be helpful.  Pull your pelvic floor muscles in tightly by using the image of holding back gas, or closing around the anus (visualize making a circle smaller) and lifting the anus up and in.  Then release the muscles  and your anus should drop down and feel open. Repeat 5 times ending with the feeling of relaxation. c) Keep your pelvic floor muscles relaxed; let your belly bulge out. d) The digestive tract starts at the mouth and ends at the anal opening, so be sure to relax both ends of the tube.  Place your tongue on the roof of your mouth with your teeth separated.  This helps relax your mouth and will help to relax the anus at the same time.  3) Empty (defecation) a) Keep your pelvic floor and sphincter relaxed, then bulge your anal muscles.  Make the anal opening wide.  b) Stick your belly out as if you have swallowed a beach ball. c) Make your belly wall hard using your belly muscles while continuing to breathe. Doing this makes it easier to open your anus. d) Breath out and give a grunt (or try using other sounds such as ahhhh, shhhhh, ohhhh or grrrrrrr).  4) Finish a) As you finish your bowel movement, pull the pelvic floor muscles up and in.  This will  leave your anus in the proper place rather than remaining pushed out and down. If you leave your anus pushed out and down, it will start to feel as though that is normal and give you incorrect signals about needing to have a bowel movement.    Earlie Counts, PT [facility] [address] [place], [place] [address]   Ways to get started on an exercise program 1.  Start for 10 minutes per day with a walking program. 2. Work towards 30 minutes of exercise per day 3. When you do an aerobic exercise program start on a low level 4. Water aerobics is a good place due to decreased strain on your joints 5. Begin your exercise program gradually and progress slowly over time 6. When exercising use correct form. a. Keep neutral spine b. Engage abdominals c. Keep chest up  d. Chin down e. Do not lock your knees  Earlie Counts, PT Outpatient Rehab at [facility] [address] [place], [place] [address] [phone]

## 2016-04-02 NOTE — Telephone Encounter (Signed)
5/19 - New Rx for Xeloda faxed to [facility] outpatient pharmacy

## 2016-04-02 NOTE — Therapy (Signed)
[facility] [address], [place], [place], [address] Phone: [phone]   Fax:  [phone]  Physical Therapy Treatment  Patient Details  Name: Jermaine Smith MRN: [id number] Date of Birth: [birth year] Referring Provider: Dr. Burr Medico  Encounter Date: 04/02/2016      PT End of Session - 04/02/16 1111    Visit Number 1   PT Start Time [id number]   PT Stop Time 1059   PT Time Calculation (min) 18 min   Activity Tolerance Patient tolerated treatment well   Behavior During Therapy [facility] for tasks assessed/performed      Past Medical History  Diagnosis Date  . Left inguinal hernia   . Hiatal hernia   . GERD (gastroesophageal reflux disease)     Past Surgical History  Procedure Laterality Date  . Inguinal hernia repair  1961    open right inguinal age 73 y/o  . Lap bilateral ing. hernia repair Bilateral 05/09/12  . Eus N/A 03/25/2016    Procedure: LOWER ENDOSCOPIC ULTRASOUND (EUS);  Surgeon: Milus Banister, MD;  Location: [facility];  Service: Endoscopy;  Laterality: N/A;    There were no vitals filed for this visit.      Subjective Assessment - 04/02/16 1106    Subjective Patiet attending GI clinic   Patient is accompained by: Family member  wife   Patient Stated Goals education   Currently in Pain? No/denies            [facility] PT Assessment - 04/02/16 0001    Assessment   Medical Diagnosis Pancreatic Cancer   Referring Provider Dr. Burr Medico   Onset Date/Surgical Date 03/19/16   Prior Therapy None   Precautions   Precautions Other (comment)   Precaution Comments cancer precautions   Restrictions   Weight Bearing Restrictions No   Balance Screen   Has the patient fallen in the past 6 months No   Has the patient had a decrease in activity level because of a fear of falling?  No   Is the patient reluctant to leave their home because of a fear of falling?  No   Prior Function   Level of Independence Independent   Cognition   Overall Cognitive Status Within Functional Limits for tasks assessed   Observation/Other Assessments   Focus on Therapeutic Outcomes (FOTO)  Therapist discretion is 0% limitation at this time prior to treatment   Posture/Postural Control   Posture/Postural Control No significant limitations   ROM / Strength   AROM / PROM / Strength AROM;Strength   AROM   Overall AROM  Within functional limits for tasks performed   Overall AROM Comments full lumbar ROM   Strength   Overall Strength Within functional limits for tasks performed   Ambulation/Gait   Ambulation/Gait No                             PT Education - 04/02/16 1110    Education provided Yes   Education Details toileting technique, walking program, tips to conserve energy, Skin care   Person(s) Educated Patient   Methods Explanation;Demonstration;Verbal cues;Handout   Comprehension Verbalized understanding;Returned demonstration             PT Long Term Goals - 04/02/16 1115    PT LONG TERM GOAL #1   Title education on how to conserve energy for after chemotherapy and radiation   Time 1   Period Days   Status  Achieved   PT LONG TERM GOAL #2   Title education on toileting technique and skin care to protect rectal area after treatment   Time 1   Period Weeks   Status Achieved   PT LONG TERM GOAL #3   Title education on walking program and continuation of his exercise program for better recovery after treatment   Time 1   Period Weeks   Status Achieved               Plan - 04/02/16 1112    Clinical Impression Statement Patient is a 65 year old male with diagnosis of rectal cancer on 03/19/2016.  Patient is attending GI clinic with his wife.  Patient will start chemotherapy and radiation on 04/14/2016.  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  He exercises regulary.  Therapist instructed patient to contiune with exercise for better outcomes wiht treatment.  Patient  was educated on taking care of skin after radiation and conserve energy with treatment.  Patient benefited from physical therapy to be educated on how to manage after treatment.    Rehab Potential Excellent   PT Frequency 1x / week   PT Duration --  1 time visit in GI clinic   PT Treatment/Interventions Patient/family education   PT Next Visit Plan Discharge to HEP   PT Home Exercise Plan Current HEP   Recommended Other Services None   Consulted and Agree with Plan of Care Patient;Family member/caregiver   Family Member Consulted wife      Patient will benefit from skilled therapeutic intervention in order to improve the following deficits and impairments:  Other (comment) (education of future chemotherapy and radiation therapy)  Visit Diagnosis: Rectal cancer ([place]) - Plan: PT plan of care cert/re-cert     Problem List Patient Active Problem List   Diagnosis Date Noted  . Rectal cancer ([place]) 04/02/2016  . Iron deficiency anemia 04/02/2016  . Recurrent right inguinal hernia 05/29/2012  . Left inguinal hernia 04/05/2012    Jermaine Smith, PT 04/02/2016 11:35 AM   [facility] [address] [place], [place], [address] Phone: [phone]   Fax:  [phone]  Name: Jermaine Smith MRN: [id number] Date of Birth: [birth year]

## 2016-04-02 NOTE — Progress Notes (Addendum)
[facility] [place]  [phone] [phone]  Clinic New Consult Note   Patient Care Team: Maury Dus, MD as PCP - General (Family Medicine) Ladene Artist, MD as Consulting Physician (Gastroenterology) Leighton Ruff, MD as Consulting Physician (General Surgery) Truitt Merle, MD as Consulting Physician (Hematology) Kyung Rudd, MD as Consulting Physician (Radiation Oncology) 04/02/2016  REFERRAL PHYSICIAN: Dr. Fuller Plan   CHIEF COMPLAINTS/PURPOSE OF CONSULTATION:  Newly diagnosed proximal rectal cancer  HISTORY OF PRESENTING ILLNESS:  Jermaine Smith 65 y.o. male is here because of his recently diagnosed rectal adenocarcinoma. He is accompanied by his wife to our multidisciplinary GI clinic today.  He has been having rectal bleeding for the past 4 months, intermittent, small amount, mixed with stool, BM is regular, but smaller caliber, no rectal pain, nausea, bloating, he has lost 5 lbs during his recent colonoscopy procedures, his appetite is normal, has been eating less, since the diagnosis of cancer. His appetite and energy level are normal. He saw gastroenterologist Dr. Fuller Plan in the past for his GERD, and self-referred back to Dr. Fuller Plan and underwent colonoscopy in early May 2017. The colonoscopy showed a polyp in sigmoid colon, and a fungating mass in the proximal rectum, biopsy showed adenocarcinoma. He underwent EUS Colonoscopy, which showed a T3 N1 disease. CT scan was negative for distant metastasis.  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 He has been very healthy, only takes PPI for acid reflux and baby aspirin. Exercise regularly. He feels well well overall.   MEDICAL HISTORY:  Past Medical History  Diagnosis Date  . Left inguinal hernia   . Hiatal hernia   . GERD (gastroesophageal reflux disease)     SURGICAL HISTORY: Past Surgical History  Procedure Laterality Date  . Inguinal hernia repair  1961    open right  inguinal age 78 y/o  . Lap bilateral ing. hernia repair Bilateral 05/09/12  . Eus N/A 03/25/2016    Procedure: LOWER ENDOSCOPIC ULTRASOUND (EUS);  Surgeon: Milus Banister, MD;  Location: [facility];  Service: Endoscopy;  Laterality: N/A;    SOCIAL HISTORY: Social History   Social History  . Marital Status: Married    Spouse Name: N/A  . Number of Children: 2  . Years of Education: N/A   Occupational History  . endoscope specialist     Works on [place]  . Smoking status: Former Smoker    Types: Cigarettes    Quit date: 04/05/1977  . Smokeless tobacco: Never Used  . Alcohol Use: 0.0 oz/week    0 Standard drinks or equivalent per week     Comment: ocassional  . Drug Use: No  . Sexual Activity: Not on file   Other Topics Concern  . 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   FAMILY HISTORY: Family History  Problem Relation Age of Onset  . Heart disease Father   . Clotting disorder Mother     PE  . Hypotension Mother   . Colon cancer Neg Hx                              He has two adopted children, 51 and 19 yo  ALLERGIES:  has No Known Allergies.  MEDICATIONS:  Current Outpatient Prescriptions  Medication Sig Dispense Refill  . aspirin  81 MG tablet Take 81 mg by mouth daily.    Marland Kitchen dexlansoprazole (DEXILANT) 60 MG capsule Take 60 mg by mouth daily.    . ferrous sulfate 325 (65 FE) MG tablet Take 325 mg by mouth daily with breakfast.    . Multiple Vitamin (MULTIVITAMIN) tablet Take 1 tablet by mouth as needed.    Marland Kitchen VITAMIN D, ERGOCALCIFEROL, PO Take 1 capsule by mouth daily.     No current facility-administered medications for this visit.    REVIEW OF SYSTEMS:   Constitutional: Denies fevers, chills or abnormal night sweats Eyes: Denies blurriness of vision, double vision or watery eyes Ears, nose, mouth, throat, and face: Denies  mucositis or sore throat Respiratory: Denies cough, dyspnea or wheezes Cardiovascular: Denies palpitation, chest discomfort or lower extremity swelling Gastrointestinal:  Denies nausea, heartburn or change in bowel habits Skin: Denies abnormal skin rashes Lymphatics: Denies new lymphadenopathy or easy bruising Neurological:Denies numbness, tingling or new weaknesses Behavioral/Psych: Mood is stable, no new changes  All other systems were reviewed with the patient and are negative.  PHYSICAL EXAMINATION: ECOG PERFORMANCE STATUS: 0 - Asymptomatic  Filed Vitals:   04/02/16 0824  BP: 140/57  Pulse: 63  Temp: 97.6 F (36.4 C)  Resp: 18   Filed Weights   04/02/16 0824  Weight: 205 lb 12.8 oz (93.35 kg)    GENERAL:alert, no distress and comfortable SKIN: skin color, texture, turgor are normal, no rashes or significant lesions EYES: normal, conjunctiva are pink and non-injected, sclera clear OROPHARYNX:no exudate, no erythema and lips, buccal mucosa, and tongue normal  NECK: supple, thyroid normal size, non-tender, without nodularity LYMPH:  no palpable lymphadenopathy in the cervical, axillary or inguinal LUNGS: clear to auscultation and percussion with normal breathing effort HEART: regular rate & rhythm and no murmurs and no lower extremity edema ABDOMEN:abdomen soft, non-tender and normal bowel sounds, No organomegaly. Rectal exam was not performed. Musculoskeletal:no cyanosis of digits and no clubbing  PSYCH: alert & oriented x 3 with fluent speech NEURO: no focal motor/sensory deficits  LABORATORY DATA:  I have reviewed the data as listed CBC Latest Ref Rng 03/19/2016  WBC 4.0 - 10.5 K/uL 5.4  Hemoglobin 13.0 - 17.0 g/dL 11.9(L)  Hematocrit 39.0 - 52.0 % 35.0(L)  Platelets 150.0 - 400.0 K/uL 207.0    CMP Latest Ref Rng 03/19/2016  Glucose 70 - 99 mg/dL 93  BUN 6 - 23 mg/dL 14  Creatinine 0.40 - 1.50 mg/dL 1.12  Sodium 135 - 145 mEq/L 140  Potassium 3.5 - 5.1 mEq/L 4.6    Chloride 96 - 112 mEq/L 105  CO2 19 - 32 mEq/L 28  Calcium 8.4 - 10.5 mg/dL 9.5  Total Protein 6.0 - 8.3 g/dL 6.7  Total Bilirubin 0.2 - 1.2 mg/dL 0.9  Alkaline Phos 39 - 117 U/L 68  AST 0 - 37 U/L 18  ALT 0 - 53 U/L 14   CEA  Status: Finalresult Visible to patient:  Not Released Nextappt: Today at 08:30 AM in Oncology [facility] Medico, Krista Blue, MD) Dx:  Rectal mass       Notes Recorded by Marlon Pel, RN on 03/22/2016 at 10:05 AM Patient notified Notes Recorded by Ladene Artist, MD on 03/22/2016 at 8:04 AM normal     Ref Range 2wk ago    CEA ng/mL 1.1         Results for GARRUS, GAUTHREAUX (MRN [id number]) as of 04/02/2016 07:27  Ref. Range 03/19/2016 15:38  Iron Latest Ref Range: 42-165 ug/dL  44  Saturation Ratios Latest Ref Range: 20.0-50.0 % 10.4 (L)  Ferritin Latest Ref Range: 22.0-322.0 ng/mL 7.0 (L)  Transferrin Latest Ref Range: 212.0-360.0 mg/dL 303.0  Folate Latest Ref Range: >5.9 ng/mL 16.5   Pathology report  Diagnosis 03/19/2016 1. Colon, polyp(s), sigmoid - TUBULAR ADENOMA(S). - HIGH GRADE DYSPLASIA IS NOT IDENTIFIED. 2. Rectum, biopsy, mass - INVASIVE ADENOCARCINOMA.  RADIOGRAPHIC STUDIES: I have personally reviewed the radiological images as listed and agreed with the findings in the report. Ct Chest W Contrast  03/26/2016  CLINICAL DATA:  Rectal cancer.  Evaluate for metastatic disease. EXAM: CT CHEST WITH CONTRAST TECHNIQUE: Multidetector CT imaging of the chest was performed during intravenous contrast administration. CONTRAST:  28m ISOVUE-300 IOPAMIDOL (ISOVUE-300) INJECTION 61% COMPARISON:  None. FINDINGS: Mediastinum / Lymph Nodes: There is no axillary lymphadenopathy. No mediastinal lymphadenopathy. There is no hilar lymphadenopathy. The heart size is normal. No pericardial effusion. Small hiatal hernia. The esophagus has normal imaging features. Lungs / Pleura: 2 mm nodule is seen along the right major fissure (image 66 series 3). Several scattered  calcified granulomata are noted in the right lower lobe. There is biapical pleural-parenchymal scarring in the upper lobes. 3 mm noncalcified nodule identified in the left upper lobe on image 81 series 3. Calcified granulomas are seen in the lingula on images 94 and 95. No focal airspace consolidation. No pulmonary edema or pleural effusion. Upper Abdomen:  Unremarkable. MSK / Soft Tissues: Bone windows reveal no worrisome lytic or sclerotic osseous lesions. Multiple cavernomas are seen in the thoracic spine. Cystic change in the posterior glenoid is probably degenerative. IMPRESSION: 1. No definite metastatic disease in the chest. The patient has scattered calcified granulomata in both lungs with two very tiny non calcified nodules identified. These noncalcified lesions are also likely benign, but close attention on follow-up recommended. Electronically Signed   By: EMisty StanleyM.D.   On: 03/26/2016 10:08   Ct Abdomen Pelvis W Contrast  03/23/2016  CLINICAL DATA:  Rectal cancer diagnosis on recent colonoscopy. EXAM: CT ABDOMEN AND PELVIS WITH CONTRAST TECHNIQUE: Multidetector CT imaging of the abdomen and pelvis was performed using the standard protocol following bolus administration of intravenous contrast. CONTRAST:  1041mISOVUE-300 IOPAMIDOL (ISOVUE-300) INJECTION 61% COMPARISON:  None. FINDINGS: Lower chest: Lung bases are clear. Hepatobiliary: No focal hepatic lesion. No biliary duct dilatation. Gallbladder is normal. Common bile duct is normal. Pancreas: Pancreas is normal. No ductal dilatation. No pancreatic inflammation. Spleen: Normal spleen Adrenals/urinary tract: Adrenal glands and kidneys are normal. The ureters and bladder normal. Stomach/Bowel: Small hiatal hernia. The duodenum and small bowel are normal. Appendix and cecum normal. The ascending and transverse colon are normal. Descending colon normal. Sigmoid colon normal. In the mid rectum there is asymmetric thickening along the RIGHT aspect  of the rectum measuring 2 cm in thickness (image 70, series 5). This lesion is approximately 10 cm from the anus at the S2-S3 vertebral body level. Lesion is approximately 5 cm in length (image 51, series 6). Ovoid 15 mm lesion in the RIGHT obturator space (image 75, series 2) is concerning for an abnormal lymph node. Small perirectal lymph node noted on the LEFT measuring 7 mm (image 69, series 2). Vascular/Lymphatic: Mild intimal calcification aorta. No periaortic retroperitoneal adenopathy or mesenteric adenopathy. Deep pelvis lymph nodes described in the bowel section. Reproductive: Prostate normal. Other: No peritoneal disease. Musculoskeletal: No aggressive osseous lesion. Densely sclerotic lesion in L3 is likely benign bone island. IMPRESSION: 1. Asymmetric rectal mass approximately 10  cm from the anus. 2. Two suspicious lymph nodes in the deep pelvis concerning for local nodal metastasis. 3. No evidence of metastatic disease outside of the pelvis. Electronically Signed   By: Suzy Bouchard M.D.   On: 03/23/2016 16:13   EUS by Dr. Ardis Hughs 03/25/2016 Endosonographic Finding 1. The mass above correlated with a hyoechoic mass that clearly invades into and through the muscularis propria layer of the rectal wall (uT3). 2. The was one small (90m) suspicious perirectal lymphnode that was suspicious for malignant involvement (uN1) 3. The right obturator lymphnode that was described on recent CT scan was not visible on this exam 5cm long, non-circumferential uT3N1 (stage IIIb) proximal rectal adenocarcinoma with distal edge located 9-10cm from the anal verge. Following EUS staging, the mass was labeled with submucosal injection of SPOT.   COLONOSCOPY by Dr. SFuller Plan5/03/2016 Findings:  The digital rectal exam was normal. - A fungating partially obstructing mass was found in the proximal rectum. The mass was partially circumferential (involving two-thirds of the lumen circumference). The mass measured  six cm in length by 4 cm in width. It extended from 10-16 cm from the anal verge. Oozing was present. This was biopsied with a cold forceps for histology. - A 10 mm polyp was found in the sigmoid colon. The polyp was semi-pedunculated. The polyp was removed with a hot snare. Resection and retrieval were complete. - Internal hemorrhoids were found during retroflexion. The hemorrhoids were small and Grade I (internal hemorrhoids that do not prolapse). - The exam was otherwise normal throughout the examined colon.  IMPRESSION: - Malignant partially obstructing tumor in the proximal rectum. Biopsied. - One 10 mm polyp in the sigmoid colon, removed with a hot snare. Resected and retrieved. - Internal hemorrhoids.   ASSESSMENT & PLAN: 65year old occasion male, presented with mild intermittent rectal bleeding for 4 months.  1. Proximal rectal adenocarcinoma, CT3N1M0, stag IIIB, MMR unknown  -I discussed his CT scan finding, colonoscopy and biopsy results in great details with patient and his wife. -I'll request MMR to be done on his biopsy for prognosis. -Staging results was discussed with him also, the risk of cancer recurrence for stage III rectal cancer is high, the standard care is neoadjuvant chemotherapy and radiation, followed by surgery and adjuvant chemotherapy. This was discussed with him in great details -I recommend oral capecitabine 8 25 mg/m twice daily with concurrent radiation. The alternative option of intravenous 5-FU continuous infusion were discussed with him also. He prefers capecitabine due to the convenience. --Chemotherapy consent: Side effects including but does not not limited to, fatigue, nausea, vomiting, diarrhea, hair loss, neuropathy, fluid retention, renal and kidney dysfunction, neutropenic fever, needed for blood transfusion, bleeding, coronary artery spasm and heart attack, were discussed with patient in great detail. He agrees to proceed. -I will schedule him for  weekly lab when he is on chemoradiation. I'll see him every 2 weeks.   2. Iron deficient anemia from rectal bleeding. -He has a mild anemia with hemoglobin 11.9. -His our study showed low ferritin  and transferrin saturation, consistent with iron deficiency. This is from his rectal bleeding. -He has started to ferrous sulfate twice daily, I encouraged him to take the pill with orange juice or vitamin C after meal -I do not think he needs IV iron -We'll monitor his CBC and iron study closely.  Plan -[facility], he will start on the first day of his radiation -  I'll see him back on the first day of his radiation. -Chemotherapy class next week.   Orders Placed This Encounter  Procedures  . CBC with Differential    Standing Status: Standing     Number of Occurrences: 30     Standing Expiration Date: 04/02/2021  . Comprehensive metabolic panel    Standing Status: Standing     Number of Occurrences: 30     Standing Expiration Date: 04/02/2021  . Ferritin    Standing Status: Standing     Number of Occurrences: 30     Standing Expiration Date: 04/02/2021  . Iron and TIBC    Standing Status: Standing     Number of Occurrences: 30     Standing Expiration Date: 04/02/2021  . CEA    Standing Status: Standing     Number of Occurrences: 30     Standing Expiration Date: 04/02/2021    All questions were answered. The patient knows to call the clinic with any problems, questions or concerns. I spent 55 minutes counseling the patient face to face. The total time spent in the appointment was 60 minutes and more than 50% was on counseling.   Recommendations: Based on information available as of today's consult. Recommendations may change depending on the results of further tests or exams. 1) Radiation therapy M-F X 6 weeks with oral Xeloda M-F for 6 weeks 2) Surgery will follow the neoadjuvant chemo/RT 3)  Next Steps: 1) RT  simulation next week with plans to begin treatment on 04/14/16 2) Chemotherapy education class 04/07/16 at 10:00 3)   Truitt Merle, MD 04/02/2016 8:47 AM

## 2016-04-02 NOTE — Progress Notes (Signed)
Oncology Nurse Navigator Documentation  Oncology Nurse Navigator Flowsheets 04/02/2016  Navigator Location [facility]  Navigator Encounter Type Initial MedOnc;Initial RadOnc  Abnormal Finding Date 03/19/2016  Confirmed Diagnosis Date 03/19/2016  Patient Visit Type MedOnc;RadOnc  Treatment Phase Pre-Tx/Tx Discussion  Barriers/Navigation Needs Education;Coordination of Care  Education Understanding Cancer/ Treatment Options;Pain/ Symptom Management;Newly Diagnosed Cancer Education;Preparing for Upcoming Surgery/ Treatment  Interventions Coordination of Care;Education Method  Coordination of Care Appts--message to rad onc to work on SIM appointment for next week. Start tx 5/31  Education Method Verbal;Written;Teach-back  Support Groups/Services GI Support Group;Other;[employer]--declined  Acuity Level 2  Time Spent with Patient 45  Met with patient and wife,[name] during new patient MDC visit. Explained the role of the GI Nurse Navigator and provided New Patient Packet with information on: 1. Colorectal cancer-anatomy of colon, SIM procedure handout reviewed, dose of his Xeloda (1800 mg bid in two pill strengths) 2. Support groups 3. Advanced Directives info provided 4. Fall Safety Plan Answered questions, reviewed current treatment plan using TEACH back and provided emotional support. Provided copy of current treatment plan. Patient was also seen by dietician, CSW and physical therapy today. Escorted patient to lobby afterwards and showed him where he will register for SIM appointment.  Susan Coward, RN, BSN GI Oncology Navigator [facility]  

## 2016-04-02 NOTE — Progress Notes (Signed)
[facility] Psychosocial Distress Screening Clinical Social Work  Clinical Social Work met with pt and his wife at [facility] to introduce self, explain role of CSW/Pt and Family Support Team and to review distress screening protocol. The patient scored a 2 on the Psychosocial Distress Thermometer which indicates no distress. Clinical Social Worker discussed common emotions assessed for distress and other psychosocial needs. Pt shared some concerns about insurance coverage and CSW explained how that works and options for assistance. Pt reports he is coping well currently and adjusting appropriately. CSW reviewed Support Programs, GI group and other as options for additional support. Pt encouraged to also reach out to CSW as needed for further support, resources and assistance.   ONCBCN DISTRESS SCREENING 04/02/2016  Screening Type Initial Screening  Distress experienced in past week (1-10) 2  Practical problem type Insurance  Emotional problem type Adjusting to illness  Referral to financial advocate Yes  Referral to support programs Yes    Clinical Social Worker follow up needed: No.  If yes, follow up plan:

## 2016-04-02 NOTE — Progress Notes (Signed)
Radiation Oncology         [phone] ________________________________  Name: Jermaine Smith MRN: [id number]  Date: 04/02/2016  DOB: Sep 14, 1951  [id number] ALEXANDER, MD  Maury Dus, MD     REFERRING PHYSICIAN: Maury Dus, MD   DIAGNOSIS: The encounter diagnosis was Rectal cancer [facility]).  Rectal cancer [facility])   Staging form: Colon and Rectum, AJCC 7th Edition     Clinical stage from 03/19/2016: Stage IIIB (T3, N1, M0) - Signed by Truitt Merle, MD on 04/02/2016    HISTORY OF PRESENT ILLNESS::Jermaine Smith is a 65 y.o. male who is seen for an initial consultation visit regarding the patient's diagnosis of rectal cancer.  The patient presented with initial symptoms of rectal bleeding. Otherwise, the patient has done well symptomatically   Endoscopy/ colonoscopy has been performed. A rectal mass was noted. A complete colonoscopy beyond the tumor was able to be performed. A biospy was performed. This returned positive for adenocarcinoma from a biopsy from a proximal rectal tumor.  An endoscopic ultrasound has been performed. Based on the this study, the tumor was staged as T 3 N 1. The distance from the anal verge was 9-10 cm. The length of the tumor was 5 cm. The tumor was described as a non-circumferential.   Based on current imaging, a corresponding rectal tumor was seen.  Pelvic/ regional nodes was seen corresponding to 2 deep pelvic nodes. Distant metastatic disease was not seen. Suspicious findings in the liver were not identified.  PREVIOUS RADIATION THERAPY: No   PAST MEDICAL HISTORY:  has a past medical history of Left inguinal hernia; Hiatal hernia; and GERD (gastroesophageal reflux disease).     PAST SURGICAL HISTORY: Past Surgical History  Procedure Laterality Date  . Inguinal hernia repair  1961    open right inguinal age 41 y/o  . Lap bilateral ing. hernia repair Bilateral 05/09/12  . Eus N/A 03/25/2016    Procedure: LOWER ENDOSCOPIC ULTRASOUND (EUS);  Surgeon: Milus Banister, MD;  Location: [facility];  Service: Endoscopy;  Laterality: N/A;     FAMILY HISTORY: family history includes Clotting disorder in his mother; Heart disease in his father; Hypotension in his mother. There is no history of Colon cancer.   SOCIAL HISTORY:  reports that he quit smoking about 39 years ago. His smoking use included Cigarettes. He has a 2.5 pack-year smoking history. He has never used smokeless tobacco. He reports that he drinks alcohol. He reports that he does not use illicit drugs.   ALLERGIES: Review of patient's allergies indicates no known allergies.   MEDICATIONS:  Current Outpatient Prescriptions  Medication Sig Dispense Refill  . aspirin 81 MG tablet Take 81 mg by mouth daily.    . capecitabine (XELODA) 150 MG tablet Take 2 tablets (300 mg total) by mouth 2 (two) times daily after a meal. 60 tablet 1  . capecitabine (XELODA) 500 MG tablet Take 3 tablets (1,500 mg total) by mouth 2 (two) times daily after a meal. 90 tablet 1  . dexlansoprazole (DEXILANT) 60 MG capsule Take 60 mg by mouth daily.    . ferrous sulfate 325 (65 FE) MG tablet Take 325 mg by mouth daily with breakfast.    . Multiple Vitamin (MULTIVITAMIN) tablet Take 1 tablet by mouth as needed.    Marland Kitchen VITAMIN D, ERGOCALCIFEROL, PO Take 1 capsule by mouth daily.     No current facility-administered medications for this encounter.     REVIEW OF SYSTEMS:  A 15 point review of  systems is documented in the electronic medical record. This was obtained by the nursing staff. However, I reviewed this with the patient to discuss relevant findings and make appropriate changes.  Pertinent items are noted in HPI.    PHYSICAL EXAM:  vitals were not taken for this visit.  ECOG = 1  0 - Asymptomatic (Fully active, able to carry on all predisease activities without restriction)  1 - Symptomatic but completely ambulatory (Restricted in physically strenuous activity but ambulatory and able to carry out work of a  light or sedentary nature. For example, light housework, office work)  2 - Symptomatic, <50% in bed during the day (Ambulatory and capable of all self care but unable to carry out any work activities. Up and about more than 50% of waking hours)  3 - Symptomatic, >50% in bed, but not bedbound (Capable of only limited self-care, confined to bed or chair 50% or more of waking hours)  4 - Bedbound (Completely disabled. Cannot carry on any self-care. Totally confined to bed or chair)  5 - Death   Eustace Pen MM, Creech RH, Tormey DC, et al. [phone]). "Toxicity and response criteria of the [facility]". [place] Oncol. 5 (6): 649-55  General: Well-developed, in no acute distress HEENT: Normocephalic, atraumatic; oral cavity clear Neck: Supple without any lymphadenopathy Cardiovascular: Regular rate and rhythm Respiratory: Clear to auscultation bilaterally GI: Soft, nontender, normal bowel sounds Extremities: No edema present Neuro: No focal deficits     LABORATORY DATA:  Lab Results  Component Value Date   WBC 5.4 03/19/2016   HGB 11.9* 03/19/2016   HCT 35.0* 03/19/2016   MCV 79.4 03/19/2016   PLT 207.0 03/19/2016   Lab Results  Component Value Date   NA 140 03/19/2016   K 4.6 03/19/2016   CL 105 03/19/2016   CO2 28 03/19/2016   Lab Results  Component Value Date   ALT 14 03/19/2016   AST 18 03/19/2016   ALKPHOS 68 03/19/2016   BILITOT 0.9 03/19/2016      RADIOGRAPHY: Ct Chest W Contrast  03/26/2016  CLINICAL DATA:  Rectal cancer.  Evaluate for metastatic disease. EXAM: CT CHEST WITH CONTRAST TECHNIQUE: Multidetector CT imaging of the chest was performed during intravenous contrast administration. CONTRAST:  68mL ISOVUE-300 IOPAMIDOL (ISOVUE-300) INJECTION 61% COMPARISON:  None. FINDINGS: Mediastinum / Lymph Nodes: There is no axillary lymphadenopathy. No mediastinal lymphadenopathy. There is no hilar lymphadenopathy. The heart size is normal. No  pericardial effusion. Small hiatal hernia. The esophagus has normal imaging features. Lungs / Pleura: 2 mm nodule is seen along the right major fissure (image 66 series 3). Several scattered calcified granulomata are noted in the right lower lobe. There is biapical pleural-parenchymal scarring in the upper lobes. 3 mm noncalcified nodule identified in the left upper lobe on image 81 series 3. Calcified granulomas are seen in the lingula on images 94 and 95. No focal airspace consolidation. No pulmonary edema or pleural effusion. Upper Abdomen:  Unremarkable. MSK / Soft Tissues: Bone windows reveal no worrisome lytic or sclerotic osseous lesions. Multiple cavernomas are seen in the thoracic spine. Cystic change in the posterior glenoid is probably degenerative. IMPRESSION: 1. No definite metastatic disease in the chest. The patient has scattered calcified granulomata in both lungs with two very tiny non calcified nodules identified. These noncalcified lesions are also likely benign, but close attention on follow-up recommended. Electronically Signed   By: Misty Stanley M.D.   On: 03/26/2016 10:08   Ct  Abdomen Pelvis W Contrast  03/23/2016  CLINICAL DATA:  Rectal cancer diagnosis on recent colonoscopy. EXAM: CT ABDOMEN AND PELVIS WITH CONTRAST TECHNIQUE: Multidetector CT imaging of the abdomen and pelvis was performed using the standard protocol following bolus administration of intravenous contrast. CONTRAST:  147mL ISOVUE-300 IOPAMIDOL (ISOVUE-300) INJECTION 61% COMPARISON:  None. FINDINGS: Lower chest: Lung bases are clear. Hepatobiliary: No focal hepatic lesion. No biliary duct dilatation. Gallbladder is normal. Common bile duct is normal. Pancreas: Pancreas is normal. No ductal dilatation. No pancreatic inflammation. Spleen: Normal spleen Adrenals/urinary tract: Adrenal glands and kidneys are normal. The ureters and bladder normal. Stomach/Bowel: Small hiatal hernia. The duodenum and small bowel are normal.  Appendix and cecum normal. The ascending and transverse colon are normal. Descending colon normal. Sigmoid colon normal. In the mid rectum there is asymmetric thickening along the RIGHT aspect of the rectum measuring 2 cm in thickness (image 70, series 5). This lesion is approximately 10 cm from the anus at the S2-S3 vertebral body level. Lesion is approximately 5 cm in length (image 51, series 6). Ovoid 15 mm lesion in the RIGHT obturator space (image 75, series 2) is concerning for an abnormal lymph node. Small perirectal lymph node noted on the LEFT measuring 7 mm (image 69, series 2). Vascular/Lymphatic: Mild intimal calcification aorta. No periaortic retroperitoneal adenopathy or mesenteric adenopathy. Deep pelvis lymph nodes described in the bowel section. Reproductive: Prostate normal. Other: No peritoneal disease. Musculoskeletal: No aggressive osseous lesion. Densely sclerotic lesion in L3 is likely benign bone island. IMPRESSION: 1. Asymmetric rectal mass approximately 10 cm from the anus. 2. Two suspicious lymph nodes in the deep pelvis concerning for local nodal metastasis. 3. No evidence of metastatic disease outside of the pelvis. Electronically Signed   By: Suzy Bouchard M.D.   On: 03/23/2016 16:13       IMPRESSION:  Oncology History   Rectal cancer [facility])   Staging form: Colon and Rectum, AJCC 7th Edition     Clinical stage from 03/19/2016: Stage IIIB (T3, N1, M0) - Signed by Truitt Merle, MD on 04/02/2016       Rectal cancer ([place])   03/19/2016 Initial Diagnosis Rectal cancer ([place])   03/19/2016 Initial Biopsy Rectal mass biopsy showed invasive adenocarcinoma. Sigmoid colon polyps showed tubular adenoma.    03/19/2016 Procedure Colonoscopy showed a fungating partially obstructing mass in the proximal rectum, partially circumferential involving two thirds of the lumen circumference, measuring 6 x 4 cm, 10-16 cm from the anal verge. A 10 mm polyps was found in the sigmoid colon.   03/22/2016 Tumor  Marker CEA=1.1   03/23/2016 Imaging CT chest, abdomen and pelvis with contrast showed asymmetric rectal mass approximately 10 cm from the anus, 2 suspicious lymph nodes in the deep pelvis concerning for local nodal metastasis. No other evidence of metastatic disease.   03/25/2016 Procedure EUS showed a T3 proximal rectal mass, there was one small 6 mm suspicious for rectal node that was suspicious for malignancy (uN1)    The patient is an appropriate candidate for preoperative chemoradiation treatment.   I discussed with the patient the rationale of radiation treatment in this setting. I discussed the benefit in terms of local/regional control and we also discussed how this can aid surgical resection. We also discussed the potential side effects and risks of treatment as well.   All of the patient's questions were answered. The patient wishes to proceed with radiation treatment.   PLAN: The patient will proceed with a simulation in the  near future such that we can proceed with treatment planning. I anticipate treating the patient to 50.4 Gy in 5-1/2 weeks. This will correspond to a 3-D conformal technique with daily optical guidance and weekly port films to help ensure accurate localization of the target volume. I anticipate beginning this treatment on 04/14/2016. The patient also is seeing medical oncology and concurrent chemotherapy will be coordinated.         ________________________________   Jodelle Gross, MD, PhD   **Disclaimer: This note was dictated with voice recognition software. Similar sounding words can inadvertently be transcribed and this note may contain transcription errors which may not have been corrected upon publication of note.**

## 2016-04-07 ENCOUNTER — Other Ambulatory Visit: Payer: Self-pay | Admitting: Hematology

## 2016-04-07 ENCOUNTER — Telehealth: Payer: Self-pay | Admitting: Hematology

## 2016-04-07 ENCOUNTER — Other Ambulatory Visit: Payer: Self-pay | Admitting: *Deleted

## 2016-04-07 ENCOUNTER — Encounter: Payer: Self-pay | Admitting: *Deleted

## 2016-04-07 ENCOUNTER — Telehealth: Payer: Self-pay | Admitting: *Deleted

## 2016-04-07 ENCOUNTER — Other Ambulatory Visit: Payer: BLUE CROSS/BLUE SHIELD

## 2016-04-07 ENCOUNTER — Ambulatory Visit
Admission: RE | Admit: 2016-04-07 | Discharge: 2016-04-07 | Disposition: A | Discharge status: Home / Self Care | Payer: BLUE CROSS/BLUE SHIELD | Source: Ambulatory Visit | Attending: Radiation Oncology | Admitting: Radiation Oncology

## 2016-04-07 DIAGNOSIS — Z510 Encounter for antineoplastic radiation therapy: Secondary | ICD-10-CM | POA: Diagnosis not present

## 2016-04-07 DIAGNOSIS — C20 Malignant neoplasm of rectum: Secondary | ICD-10-CM | POA: Diagnosis present

## 2016-04-07 MED ORDER — CAPECITABINE 500 MG PO TABS: 1500.0000 mg | ORAL_TABLET | Freq: Two times a day (BID) | ORAL | Status: DC

## 2016-04-07 MED ORDER — CAPECITABINE 150 MG PO TABS: 300.0000 mg | ORAL_TABLET | Freq: Two times a day (BID) | ORAL | Status: DC

## 2016-04-07 NOTE — Progress Notes (Signed)
Here today for education class.  Consent for Xeloda use was signed.

## 2016-04-07 NOTE — Telephone Encounter (Signed)
cld pt and spoke to spouse and adv of time & date of appt on 6/1 per pof

## 2016-04-07 NOTE — Telephone Encounter (Signed)
Oncology Nurse Navigator Documentation  Oncology Nurse Navigator Flowsheets 04/07/2016  Navigator Location [facility]  Navigator Encounter Type Telephone;Letter/Fax/Email  Telephone Incoming Call;Outgoing Call;Medication Assistance--wife states [employer] still needs PA form sent in.  Abnormal Finding Date -  Confirmed Diagnosis Date -  Patient Visit Type -  Treatment Phase -  Barriers/Navigation Needs Coordination of Care--called PA department and had representative re-fax the form to nurse navigator. Form completed and placed on MD desk for signature. Informed collaborative nurse to return to navigator when signed.  Education -  Interventions Medication assistance: Capecitabine needed by 04/14/16  Coordination of Care -Notified Raquel in managed care to cancel the script being sent to [facility]; was already in process at [place] per pharmacist at [facility] conversation last week.  Education Method -  Support Groups/Services -  Acuity -  Time Spent with Patient 45

## 2016-04-08 ENCOUNTER — Telehealth: Payer: Self-pay | Admitting: Pharmacist

## 2016-04-08 ENCOUNTER — Encounter: Payer: Self-pay | Admitting: *Deleted

## 2016-04-08 NOTE — Telephone Encounter (Signed)
Received a VM on Oral Oncology phone line on 04/07/16 @ 16:59 - clarification needed of Xeloda Rx days supply and cycle.  Returned call to [facility] at [place] today. Confirmed that radiation will be M-F, Xeloda will be M-F, planned to begin 04/14/16. No further information needed at this time per Myesha.  I asked about shipment date. No date at this time as the Rx is under review for benefits analysis.  Raul Del, PharmD, BCPS, [facility] [phone]

## 2016-04-08 NOTE — Progress Notes (Signed)
  Oncology Nurse Navigator Documentation  Navigator Location: [facility] (04/08/16 [id number]) Navigator Encounter Type: Letter/Fax/Email (04/08/16 0910)    Faxed completed PA form from [place] to [phone] with MD progress note and confirmation fax received. Gave copy to [facility] pharmacist.

## 2016-04-09 ENCOUNTER — Telehealth: Payer: Self-pay

## 2016-04-09 ENCOUNTER — Encounter: Payer: Self-pay | Admitting: Radiation Oncology

## 2016-04-09 DIAGNOSIS — Z510 Encounter for antineoplastic radiation therapy: Secondary | ICD-10-CM | POA: Diagnosis not present

## 2016-04-09 NOTE — Progress Notes (Signed)
  Radiation Oncology         [phone]) [phone] ________________________________  Name: Jermaine Smith MRN: [id number]  Date: 04/07/2016  DOB: 07/05/1951   SIMULATION AND TREATMENT PLANNING NOTE  DIAGNOSIS:     ICD-9-CM ICD-10-CM   1. Rectal cancer ([place]) 154.1 C20      The patient presented for simulation for the patient's upcoming course of radiation for the diagnosis of rectal cancer. The patient was placed in a supine position. A customized vac-lock bag was constructed to aid in patient immobilization on. This complex treatment device will be used on a daily basis during the treatment. In this fashion a CT scan was obtained through the pelvic region and the isocenter was placed near midline within the pelvis. Surface markings were placed.  The patient's imaging was loaded into the radiation treatment planning system. The patient will initially be planned to receive a course of radiation to a dose of 45 Gy. This will be accomplished in 25 fractions at 1.8 gray per fraction. This initial treatment will correspond to a 3-D conformal technique. The target has been contoured in addition to the rectum, bladder and femoral heads. Dose volume histograms of each of these structures have been requested and these will be carefully reviewed as part of the 3-D conformal treatment planning process. To accomplish this initial treatment, 4 customized blocks have been designed for this purpose. Each of these 4 complex treatment devices will be used on a daily basis during the initial course of the treatment. It is anticipated that the patient will then receive a boost for an additional 5.4 Gy. The anticipated total dose therefore will be 50.4 Gy.    Special treatment procedure The patient will receive chemotherapy during the course of radiation treatment. The patient may experience increased or overlapping toxicity due to this combined-modality approach and the patient will be monitored for such problems. This may  include extra lab work as necessary. This therefore constitutes a special treatment procedure.    ________________________________  Jodelle Gross, MD, PhD

## 2016-04-09 NOTE — Telephone Encounter (Signed)
Xeloda prescription update:   Spoke with [employer] and Prior Authorization has been approved and patient has the option to fill at a local pharmacy.  Currently prescription is being processed with [facility].  I spoke with  the [facility] team they have processed the prescription and informed the patient of the copay and the patient confirms the funds to afford the copay.  Per the [employer] team the a 1 month copay of the 500 mg is $ 182.66 and the 150 mg is $49.27 for 1 month.  Representative at [facility] confirmed that patient would receive medication prior to Apr 14, 2016 as that is his first day to begin taking the medication.   Will follow-up with patient on Apr 13, 2016 with counseling and ensure receipt of medication.  Henreitta Leber, PharmD Oral Oncology Navigation Clinic

## 2016-04-13 ENCOUNTER — Telehealth: Payer: Self-pay | Admitting: Pharmacist

## 2016-04-13 ENCOUNTER — Telehealth: Payer: Self-pay | Admitting: *Deleted

## 2016-04-13 NOTE — Telephone Encounter (Signed)
Oral Chemotherapy Pharmacist Encounter  Pt received Xeloda today.   I spoke with patient's wife by phone for overview of new oral chemotherapy medication: Xeloda to start 04/14/16  Counseled patient on administration, dosing, side effects, safe handling, and monitoring. Side effects include but not limited to: diarrhea, fatigue, nausea, mouth sores, and hand/foot syndrome.  He knows to take his medication with water and 30 minutes after his morning and evening meals. .   All questions answered.  Will f/u in 1 week for side effect management.  Thank you, Raul Del, PharmD, [place], [facility] [phone]

## 2016-04-13 NOTE — Telephone Encounter (Signed)
Received fax confirmation of shipment of capecitabine for delivery 04/09/16 from [facility].

## 2016-04-14 ENCOUNTER — Ambulatory Visit
Admission: RE | Admit: 2016-04-14 | Discharge: 2016-04-14 | Disposition: A | Discharge status: Home / Self Care | Payer: BLUE CROSS/BLUE SHIELD | Source: Ambulatory Visit | Attending: Radiation Oncology | Admitting: Radiation Oncology

## 2016-04-14 DIAGNOSIS — Z510 Encounter for antineoplastic radiation therapy: Secondary | ICD-10-CM | POA: Diagnosis not present

## 2016-04-14 HISTORY — DX: Unspecified malignant neoplasm of skin, unspecified: C44.90

## 2016-04-14 HISTORY — DX: Family history of ischemic heart disease and other diseases of the circulatory system: Z82.49

## 2016-04-15 ENCOUNTER — Encounter: Payer: Self-pay | Admitting: Hematology

## 2016-04-15 ENCOUNTER — Telehealth: Payer: Self-pay | Admitting: Student in an Organized Health Care Education/Training Program

## 2016-04-15 ENCOUNTER — Other Ambulatory Visit: Payer: BLUE CROSS/BLUE SHIELD

## 2016-04-15 ENCOUNTER — Ambulatory Visit (HOSPITAL_BASED_OUTPATIENT_CLINIC_OR_DEPARTMENT_OTHER): Payer: BLUE CROSS/BLUE SHIELD | Admitting: Hematology

## 2016-04-15 ENCOUNTER — Other Ambulatory Visit (HOSPITAL_BASED_OUTPATIENT_CLINIC_OR_DEPARTMENT_OTHER): Payer: BLUE CROSS/BLUE SHIELD

## 2016-04-15 ENCOUNTER — Ambulatory Visit
Admission: RE | Admit: 2016-04-15 | Discharge: 2016-04-15 | Disposition: A | Discharge status: Home / Self Care | Payer: BLUE CROSS/BLUE SHIELD | Source: Ambulatory Visit | Attending: Radiation Oncology | Admitting: Radiation Oncology

## 2016-04-15 VITALS — BP 127/70 | HR 68 | Temp 97.8°F | Ht 76.0 in | Wt 204.0 lb

## 2016-04-15 DIAGNOSIS — D509 Iron deficiency anemia, unspecified: Secondary | ICD-10-CM

## 2016-04-15 DIAGNOSIS — K625 Hemorrhage of anus and rectum: Secondary | ICD-10-CM | POA: Diagnosis not present

## 2016-04-15 DIAGNOSIS — C20 Malignant neoplasm of rectum: Secondary | ICD-10-CM

## 2016-04-15 DIAGNOSIS — D508 Other iron deficiency anemias: Secondary | ICD-10-CM | POA: Diagnosis not present

## 2016-04-15 DIAGNOSIS — Z510 Encounter for antineoplastic radiation therapy: Secondary | ICD-10-CM | POA: Diagnosis not present

## 2016-04-15 LAB — CBC WITH DIFFERENTIAL/PLATELET
BASO%: 0.8 % (ref 0.0–2.0)
Basophils Absolute: 0.1 10*3/uL (ref 0.0–0.1)
EOS ABS: 0 10*3/uL (ref 0.0–0.5)
EOS%: 0.7 % (ref 0.0–7.0)
HCT: 39.6 % (ref 38.4–49.9)
HGB: 13.5 g/dL (ref 13.0–17.1)
LYMPH%: 13.3 % — AB (ref 14.0–49.0)
MCH: 27.8 pg (ref 27.2–33.4)
MCHC: 34.1 g/dL (ref 32.0–36.0)
MCV: 81.5 fL (ref 79.3–98.0)
MONO#: 0.6 10*3/uL (ref 0.1–0.9)
MONO%: 8.6 % (ref 0.0–14.0)
NEUT%: 76.6 % — ABNORMAL HIGH (ref 39.0–75.0)
NEUTROS ABS: 5.2 10*3/uL (ref 1.5–6.5)
PLATELETS: 189 10*3/uL (ref 140–400)
RBC: 4.86 10*6/uL (ref 4.20–5.82)
RDW: 16.1 % — ABNORMAL HIGH (ref 11.0–14.6)
WBC: 6.7 10*3/uL (ref 4.0–10.3)
lymph#: 0.9 10*3/uL (ref 0.9–3.3)

## 2016-04-15 LAB — COMPREHENSIVE METABOLIC PANEL
ALT: 14 U/L (ref 0–55)
ANION GAP: 7 meq/L (ref 3–11)
AST: 17 U/L (ref 5–34)
Albumin: 4.1 g/dL (ref 3.5–5.0)
Alkaline Phosphatase: 78 U/L (ref 40–150)
BUN: 19.6 mg/dL (ref 7.0–26.0)
CALCIUM: 9.8 mg/dL (ref 8.4–10.4)
CHLORIDE: 103 meq/L (ref 98–109)
CO2: 27 mEq/L (ref 22–29)
Creatinine: 1.2 mg/dL (ref 0.7–1.3)
EGFR: 66 mL/min/{1.73_m2} — ABNORMAL LOW (ref >90)
Glucose: 94 mg/dl (ref 70–140)
POTASSIUM: 4.8 meq/L (ref 3.5–5.1)
Sodium: 137 mEq/L (ref 136–145)
Total Bilirubin: 1.02 mg/dL (ref 0.20–1.20)
Total Protein: 7.3 g/dL (ref 6.4–8.3)

## 2016-04-15 LAB — FERRITIN: FERRITIN: 17 ng/mL — AB (ref 22–316)

## 2016-04-15 LAB — IRON AND TIBC
%SAT: 82 % — AB (ref 20–55)
IRON: 255 ug/dL — AB (ref 42–163)
TIBC: 311 ug/dL (ref 202–409)
UIBC: 56 ug/dL — AB (ref 117–376)

## 2016-04-15 MED ORDER — PROCHLORPERAZINE MALEATE 10 MG PO TABS: 10.0000 mg | ORAL_TABLET | Freq: Four times a day (QID) | ORAL | Status: DC | PRN

## 2016-04-15 MED ORDER — ONDANSETRON HCL 8 MG PO TABS: 8.0000 mg | ORAL_TABLET | Freq: Three times a day (TID) | ORAL | Status: DC | PRN

## 2016-04-15 NOTE — Progress Notes (Signed)
[place]  [phone] [phone]  Clinic Follow Up Note   Patient Care Team: Jermaine Dus, MD as PCP - General (Family Medicine) Jermaine Artist, MD as Consulting Physician (Gastroenterology) Jermaine Ruff, MD as Consulting Physician (General Surgery) Jermaine Merle, MD as Consulting Physician (Hematology) Jermaine Rudd, MD as Consulting Physician (Radiation Oncology) 04/15/2016   CHIEF COMPLAINTS:  Follow up proximal rectal cancer  HISTORY OF PRESENTING ILLNESS (04/02/2016):  Jermaine Smith 65 y.o. male is here because of his recently diagnosed rectal adenocarcinoma. He is accompanied by his wife to our multidisciplinary GI clinic today.  He has been having rectal bleeding for the past 4 months, intermittent, small amount, mixed with stool, BM is regular, but smaller caliber, no rectal pain, nausea, bloating, he has lost 5 lbs during his recent colonoscopy procedures, his appetite is normal, has been eating less, since the diagnosis of cancer. His appetite and energy level are normal. He saw gastroenterologist Jermaine Smith in the past for his GERD, and self-referred back to Jermaine Smith and underwent colonoscopy in early May 2017. The colonoscopy showed a polyp in sigmoid colon, and a fungating mass in the proximal rectum, biopsy showed adenocarcinoma. He underwent EUS Colonoscopy, which showed a T3 N1 disease. CT scan was negative for distant metastasis.  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 He has been very healthy, only takes PPI for acid reflux and baby aspirin. Exercise regularly. He feels well well overall.  CURRENT THERAPY: concurrent chemoRT with Xeloda 1824m q12h, Monday through Friday, started on 04/14/2016  INTERIM HISTORY: Mr. WRekowskireturns for follow up. He started concurrent chemo and radiation yesterday, tolerating well so far, except one episodes of nausea and vomiting this morning. No other new complaints. His  rectal sleeping is mild and stable. He denies any significant pain or other complaints.  MEDICAL HISTORY:  Past Medical History  Diagnosis Date  . Left inguinal hernia   . Hiatal hernia   . GERD (gastroesophageal reflux disease)   . FHx: brain aneurysm   . Skin cancer     hx basal cell carcinoma    SURGICAL HISTORY: Past Surgical History  Procedure Laterality Date  . Inguinal hernia repair  1961    open right inguinal age 65y/o  . Lap bilateral ing. hernia repair Bilateral 05/09/12  . Eus N/A 03/25/2016    Procedure: LOWER ENDOSCOPIC ULTRASOUND (EUS);  Surgeon: DMilus Banister MD;  [facility];  Service: Endoscopy;  Laterality: N/A;  . Clavicle surgery      SOCIAL HISTORY: Social History   Social History  . Marital Status: Married    Spouse Name: N/A  . Number of Children: 2  . Years of Education: N/A   Occupational History  . endoscope specialist     Works on [employer] . Smoking status: Former Smoker -- 0.50 packs/day for 5 years    Types: Cigarettes  . Smokeless tobacco: Never Used  . Alcohol Use: 0.0 oz/week    0 Standard drinks or equivalent per week     Comment: ocassional, once a month   . Drug Use: No     Comment: still smoking 1ppd,had quit in 1978,   . Sexual Activity: Not on file   Other Topics Concern  . 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   FAMILY HISTORY: Family History  Problem Relation Age of Onset  . Heart disease Father   . Clotting disorder Mother     PE  . Hypotension Mother   . Colon cancer Neg Hx                              He has two adopted children, 47 and 72 yo  ALLERGIES:  has No Known Allergies.  MEDICATIONS:  Current Outpatient Prescriptions  Medication Sig Dispense Refill  .  aspirin 81 MG tablet Take 81 mg by mouth daily.    . capecitabine (XELODA) 150 MG tablet Take 2 tablets (300 mg total) by mouth 2 (two) times daily after a meal. 60 tablet 1  . capecitabine (XELODA) 500 MG tablet Take 3 tablets (1,500 mg total) by mouth 2 (two) times daily after a meal. 90 tablet 1  . dexlansoprazole (DEXILANT) 60 MG capsule Take 60 mg by mouth daily.    . ferrous sulfate 325 (65 FE) MG tablet Take 325 mg by mouth daily with breakfast.    . Multiple Vitamin (MULTIVITAMIN) tablet Take 1 tablet by mouth as needed.    Marland Kitchen VITAMIN D, ERGOCALCIFEROL, PO Take 1 capsule by mouth daily.     No current facility-administered medications for this visit.    REVIEW OF SYSTEMS:   Constitutional: Denies fevers, chills or abnormal night sweats Eyes: Denies blurriness of vision, double vision or watery eyes Ears, nose, mouth, throat, and face: Denies mucositis or sore throat Respiratory: Denies cough, dyspnea or wheezes Cardiovascular: Denies palpitation, chest discomfort or lower extremity swelling Gastrointestinal:  Denies nausea, heartburn or change in bowel habits Skin: Denies abnormal skin rashes Lymphatics: Denies new lymphadenopathy or easy bruising Neurological:Denies numbness, tingling or new weaknesses Behavioral/Psych: Mood is stable, no new changes  All other systems were reviewed with the patient and are negative.  PHYSICAL EXAMINATION: ECOG PERFORMANCE STATUS: 0 - Asymptomatic  Filed Vitals:   04/15/16 1502  BP: 127/70  Pulse: 68  Temp: 97.8 F (36.6 C)   Filed Weights   04/15/16 1502  Weight: 204 lb (92.534 kg)    GENERAL:alert, no distress and comfortable SKIN: skin color, texture, turgor are normal, no rashes or significant lesions EYES: normal, conjunctiva are pink and non-injected, sclera clear OROPHARYNX:no exudate, no erythema and lips, buccal mucosa, and tongue normal  NECK: supple, thyroid normal size, non-tender, without nodularity LYMPH:  no palpable  lymphadenopathy in the cervical, axillary or inguinal LUNGS: clear to auscultation and percussion with normal breathing effort HEART: regular rate & rhythm and no murmurs and no lower extremity edema ABDOMEN:abdomen soft, non-tender and normal bowel sounds, No organomegaly. Rectal exam was not performed. Musculoskeletal:no cyanosis of digits and no clubbing  PSYCH: alert & oriented x 3 with fluent speech NEURO: no focal motor/sensory deficits  LABORATORY DATA:  I have reviewed the data as listed CBC Latest Ref Rng 04/15/2016 03/19/2016  WBC 4.0 - 10.3 10e3/uL 6.7 5.4  Hemoglobin 13.0 - 17.1 g/dL 81.3 11.9(L)  Hematocrit 38.4 - 49.9 % 39.6 35.0(L)  Platelets 140 - 400 10e3/uL 189 207.0    CMP Latest Ref Rng 04/15/2016 03/19/2016  Glucose 70 - 140 mg/dl 94 93  BUN 7.0 - 32.1 mg/dL 12.4 14  Creatinine 0.7 - 1.3 mg/dL 1.2 3.96  Sodium 544 - 145 mEq/L 137  140  Potassium 3.5 - 5.1 mEq/L 4.8 4.6  Chloride 96 - 112 mEq/L - 105  CO2 22 - 29 mEq/L 27 28  Calcium 8.4 - 10.4 mg/dL 9.8 9.5  Total Protein 6.4 - 8.3 g/dL 7.3 6.7  Total Bilirubin 0.20 - 1.20 mg/dL 1.02 0.9  Alkaline Phos 40 - 150 U/L 78 68  AST 5 - 34 U/L 17 18  ALT 0 - 55 U/L 14 14   Results for KOOPER, GODSHALL (MRN [id number]) as of 04/18/2016 17:06  Ref. Range 03/19/2016 15:38 04/15/2016 13:50  Iron Latest Ref Range: 42-163 ug/dL 44 255 (H)  UIBC Latest Ref Range: 117-376 ug/dL  56 (L)  TIBC Latest Ref Range: 202-409 ug/dL  311  %SAT Latest Ref Range: 20-55 %  82 (H)  Saturation Ratios Latest Ref Range: 20.0-50.0 % 10.4 (L)   Ferritin Latest Ref Range: 22-316 ng/ml 7.0 (L) 17 (L)     CEA (ng/ml) 03/19/2016: 1.1 04/15/2016: 1.9   Diagnosis 03/19/2016 1. Colon, polyp(s), sigmoid - TUBULAR ADENOMA(S). - HIGH GRADE DYSPLASIA IS NOT IDENTIFIED. 2. Rectum, biopsy, mass - INVASIVE ADENOCARCINOMA.  RADIOGRAPHIC STUDIES: I have personally reviewed the radiological images as listed and agreed with the findings in the report. Ct Chest W  Contrast  03/26/2016  CLINICAL DATA:  Rectal cancer.  Evaluate for metastatic disease. EXAM: CT CHEST WITH CONTRAST TECHNIQUE: Multidetector CT imaging of the chest was performed during intravenous contrast administration. CONTRAST:  70m ISOVUE-300 IOPAMIDOL (ISOVUE-300) INJECTION 61% COMPARISON:  None. FINDINGS: Mediastinum / Lymph Nodes: There is no axillary lymphadenopathy. No mediastinal lymphadenopathy. There is no hilar lymphadenopathy. The heart size is normal. No pericardial effusion. Small hiatal hernia. The esophagus has normal imaging features. Lungs / Pleura: 2 mm nodule is seen along the right major fissure (image 66 series 3). Several scattered calcified granulomata are noted in the right lower lobe. There is biapical pleural-parenchymal scarring in the upper lobes. 3 mm noncalcified nodule identified in the left upper lobe on image 81 series 3. Calcified granulomas are seen in the lingula on images 94 and 95. No focal airspace consolidation. No pulmonary edema or pleural effusion. Upper Abdomen:  Unremarkable. MSK / Soft Tissues: Bone windows reveal no worrisome lytic or sclerotic osseous lesions. Multiple cavernomas are seen in the thoracic spine. Cystic change in the posterior glenoid is probably degenerative. IMPRESSION: 1. No definite metastatic disease in the chest. The patient has scattered calcified granulomata in both lungs with two very tiny non calcified nodules identified. These noncalcified lesions are also likely benign, but close attention on follow-up recommended. Electronically Signed   By: EMisty StanleyM.D.   On: 03/26/2016 10:08   Ct Abdomen Pelvis W Contrast  03/23/2016  CLINICAL DATA:  Rectal cancer diagnosis on recent colonoscopy. EXAM: CT ABDOMEN AND PELVIS WITH CONTRAST TECHNIQUE: Multidetector CT imaging of the abdomen and pelvis was performed using the standard protocol following bolus administration of intravenous contrast. CONTRAST:  1015mISOVUE-300 IOPAMIDOL  (ISOVUE-300) INJECTION 61% COMPARISON:  None. FINDINGS: Lower chest: Lung bases are clear. Hepatobiliary: No focal hepatic lesion. No biliary duct dilatation. Gallbladder is normal. Common bile duct is normal. Pancreas: Pancreas is normal. No ductal dilatation. No pancreatic inflammation. Spleen: Normal spleen Adrenals/urinary tract: Adrenal glands and kidneys are normal. The ureters and bladder normal. Stomach/Bowel: Small hiatal hernia. The duodenum and small bowel are normal. Appendix and cecum normal. The ascending and transverse colon are normal. Descending colon normal. Sigmoid colon normal. In the mid rectum there is asymmetric thickening  along the RIGHT aspect of the rectum measuring 2 cm in thickness (image 70, series 5). This lesion is approximately 10 cm from the anus at the S2-S3 vertebral body level. Lesion is approximately 5 cm in length (image 51, series 6). Ovoid 15 mm lesion in the RIGHT obturator space (image 75, series 2) is concerning for an abnormal lymph node. Small perirectal lymph node noted on the LEFT measuring 7 mm (image 69, series 2). Vascular/Lymphatic: Mild intimal calcification aorta. No periaortic retroperitoneal adenopathy or mesenteric adenopathy. Deep pelvis lymph nodes described in the bowel section. Reproductive: Prostate normal. Other: No peritoneal disease. Musculoskeletal: No aggressive osseous lesion. Densely sclerotic lesion in L3 is likely benign bone island. IMPRESSION: 1. Asymmetric rectal mass approximately 10 cm from the anus. 2. Two suspicious lymph nodes in the deep pelvis concerning for local nodal metastasis. 3. No evidence of metastatic disease outside of the pelvis. Electronically Signed   By: Suzy Bouchard M.D.   On: 03/23/2016 16:13   EUS by Dr. Ardis Hughs 03/25/2016 Endosonographic Finding 1. The mass above correlated with a hyoechoic mass that clearly invades into and through the muscularis propria layer of the rectal wall (uT3). 2. The was one small  (33m) suspicious perirectal lymphnode that was suspicious for malignant involvement (uN1) 3. The right obturator lymphnode that was described on recent CT scan was not visible on this exam 5cm long, non-circumferential uT3N1 (stage IIIb) proximal rectal adenocarcinoma with distal edge located 9-10cm from the anal verge. Following EUS staging, the mass was labeled with submucosal injection of SPOT.   COLONOSCOPY by Dr. SFuller Plan5/03/2016 Findings:  The digital rectal exam was normal. - A fungating partially obstructing mass was found in the proximal rectum. The mass was partially circumferential (involving two-thirds of the lumen circumference). The mass measured six cm in length by 4 cm in width. It extended from 10-16 cm from the anal verge. Oozing was present. This was biopsied with a cold forceps for histology. - A 10 mm polyp was found in the sigmoid colon. The polyp was semi-pedunculated. The polyp was removed with a hot snare. Resection and retrieval were complete. - Internal hemorrhoids were found during retroflexion. The hemorrhoids were small and Grade I (internal hemorrhoids that do not prolapse). - The exam was otherwise normal throughout the examined colon.  IMPRESSION: - Malignant partially obstructing tumor in the proximal rectum. Biopsied. - One 10 mm polyp in the sigmoid colon, removed with a hot snare. Resected and retrieved. - Internal hemorrhoids.   ASSESSMENT & Smith: 65year old occasion male, presented with mild intermittent rectal bleeding for 4 months.  1. Proximal rectal adenocarcinoma, uT3N1M0, stag IIIB, MMR unknown  -I discussed his CT scan finding, colonoscopy and biopsy results in great details with patient and his wife. -Staging results was discussed with him also, the risk of cancer recurrence for stage III rectal cancer is high, the standard care is neoadjuvant chemotherapy and radiation, followed by surgery and adjuvant chemotherapy. This was discussed with  him in great details -I recommend oral capecitabine 825 mg/m twice daily with concurrent radiation. The alternative option of intravenous 5-FU continuous infusion were discussed with him also. He prefers capecitabine due to the convenience. -he has started concurrent chemo RT  -I called in zofran and compazine for nausea  -I will schedule him for weekly lab when he is on chemoradiation. We will see him every 2 weeks, or more frequent if needed -We discussed utilizing our symptom management clinic if he has dehydration or other  acute toxicity from treatment, to avoid ED visit. He voiced good understanding and knows to call us if needed.   2. Iron deficient anemia from rectal bleeding. -He had mild anemia with hemoglobin 11.9 when he was diagnosed with rectal cancer. -His iron study showed low ferritin  and transferrin saturation, consistent with iron deficiency. This is from his rectal bleeding. -He has started ferrous sulfate twice daily, I encouraged him to take the pill with orange juice or vitamin C after meal, he is tolerating well  -repeated lab showed Hb 13.5, and improved iron level   Smith -continue concurrent chemoRT -lab weekly -He will see APP lisa in 2 weeks and me in 4 weeks    All questions were answered. The patient knows to call the clinic with any problems, questions or concerns. I spent 20 minutes counseling the patient face to face. The total time spent in the appointment was 25 minutes and more than 50% was on counseling.    Jermaine Merle, MD 04/15/2016 8:06 AM

## 2016-04-15 NOTE — Telephone Encounter (Signed)
Gave and printed appt sched and avs for pt for June and July  °

## 2016-04-16 ENCOUNTER — Ambulatory Visit
Admission: RE | Admit: 2016-04-16 | Discharge: 2016-04-16 | Disposition: A | Discharge status: Home / Self Care | Payer: BLUE CROSS/BLUE SHIELD | Source: Ambulatory Visit | Attending: Radiation Oncology | Admitting: Radiation Oncology

## 2016-04-16 ENCOUNTER — Encounter: Payer: Self-pay | Admitting: Radiation Oncology

## 2016-04-16 VITALS — BP 118/78 | HR 78 | Temp 98.2°F | Resp 16 | Wt 203.4 lb

## 2016-04-16 DIAGNOSIS — C20 Malignant neoplasm of rectum: Secondary | ICD-10-CM

## 2016-04-16 DIAGNOSIS — Z510 Encounter for antineoplastic radiation therapy: Secondary | ICD-10-CM | POA: Diagnosis not present

## 2016-04-16 LAB — CEA: CEA1: 1.9 ng/mL (ref 0.0–4.7)

## 2016-04-16 NOTE — Progress Notes (Signed)
Department of Radiation Oncology  Phone:  [phone] Fax:        [phone]  Weekly Treatment Note    Name: Jermaine Smith Date: 04/16/2016 MRN: [id number] DOB: May 30, 1951   Diagnosis:     ICD-9-CM ICD-10-CM   1. Rectal cancer (HCC) 154.1 C20      Current dose: 5.4 Gy  Current fraction: 3   MEDICATIONS: Current Outpatient Prescriptions  Medication Sig Dispense Refill  . aspirin 81 MG tablet Take 81 mg by mouth daily.    . capecitabine (XELODA) 150 MG tablet Take 2 tablets (300 mg total) by mouth 2 (two) times daily after a meal. 60 tablet 1  . capecitabine (XELODA) 500 MG tablet Take 3 tablets (1,500 mg total) by mouth 2 (two) times daily after a meal. 90 tablet 1  . dexlansoprazole (DEXILANT) 60 MG capsule Take 60 mg by mouth daily.    . ferrous sulfate 325 (65 FE) MG tablet Take 325 mg by mouth daily with breakfast.    . Multiple Vitamin (MULTIVITAMIN) tablet Take 1 tablet by mouth as needed.    . ondansetron (ZOFRAN) 8 MG tablet Take 1 tablet (8 mg total) by mouth every 8 (eight) hours as needed for nausea. 30 tablet 2  . prochlorperazine (COMPAZINE) 10 MG tablet Take 1 tablet (10 mg total) by mouth every 6 (six) hours as needed for nausea or vomiting. 30 tablet 1  . VITAMIN D, ERGOCALCIFEROL, PO Take 1 capsule by mouth daily.     No current facility-administered medications for this encounter.     ALLERGIES: Review of patient's allergies indicates no known allergies.   LABORATORY DATA:  Lab Results  Component Value Date   WBC 6.7 04/15/2016   HGB 13.5 04/15/2016   HCT 39.6 04/15/2016   MCV 81.5 04/15/2016   PLT 189 04/15/2016   Lab Results  Component Value Date   NA 137 04/15/2016   K 4.8 04/15/2016   CL 105 03/19/2016   CO2 27 04/15/2016   Lab Results  Component Value Date   ALT 14 04/15/2016   AST 17 04/15/2016   ALKPHOS 78 04/15/2016   BILITOT 1.02 04/15/2016     NARRATIVE: [name] was seen today for weekly treatment management. The chart  was checked and the patient's films were reviewed.  Weekly rad tx  Rectal  3/25 completed, pt education done today, gave radiation therapy and you book, my business card, discussed ways to manage side effects, fatigue, skin irritation, bladder changes, n,v,d, pain, may need to go to 5-6 smaller meals and snacks between meals, increase protein in diet, low fiber diet for diarrhea, ,no freh fruits or fresh vegetables, use imodium prn, baby wipes, sitz bath prn, , sees MD weekly and prn, verbal understanding,teach back given, no c/o pain regular bowels , has some frequency bladder, takes xeloda Wt Readings from Last 3 Encounters:  04/16/16 203 lb 6.4 oz (92.262 kg)  04/15/16 204 lb (92.534 kg)  04/02/16 205 lb 12.8 oz (93.35 kg)    BP 118/78 mmHg  Pulse 78  Temp(Src) 98.2 F (36.8 C) (Oral)  Resp 16  Wt 203 lb 6.4 oz (92.262 kg)  PHYSICAL EXAMINATION: weight is 203 lb 6.4 oz (92.262 kg). His oral temperature is 98.2 F (36.8 C). His blood pressure is 118/78 and his pulse is 78. His respiration is 16.        ASSESSMENT: The patient is doing satisfactorily with treatment.  PLAN: We will continue with the patient's radiation treatment  as planned.

## 2016-04-16 NOTE — Progress Notes (Addendum)
Weekly rad tx  Rectal  3/25 completed, pt education done today, gave radiation therapy and you book, my business card, discussed ways to manage side effects, fatigue, skin irritation, bladder changes, n,v,d, pain, may need to go to 5-6 smaller meals and snacks between meals, increase protein in diet, low fiber diet for diarrhea, ,no freh fruits or fresh vegetables, use imodium prn, baby wipes, sitz bath prn, , sees MD weekly and prn, verbal understanding,teach back given, no c/o pain regular bowels , has some frequency bladder, takes xeloda Wt Readings from Last 3 Encounters:  04/16/16 203 lb 6.4 oz (92.262 kg)  04/15/16 204 lb (92.534 kg)  04/02/16 205 lb 12.8 oz (93.35 kg)    BP 118/78 mmHg  Pulse 78  Temp(Src) 98.2 F (36.8 C) (Oral)  Resp 16  Wt 203 lb 6.4 oz (92.262 kg)

## 2016-04-19 ENCOUNTER — Ambulatory Visit
Admission: RE | Admit: 2016-04-19 | Discharge: 2016-04-19 | Disposition: A | Discharge status: Home / Self Care | Payer: BLUE CROSS/BLUE SHIELD | Source: Ambulatory Visit | Attending: Radiation Oncology | Admitting: Radiation Oncology

## 2016-04-19 DIAGNOSIS — Z510 Encounter for antineoplastic radiation therapy: Secondary | ICD-10-CM | POA: Diagnosis not present

## 2016-04-20 ENCOUNTER — Ambulatory Visit
Admission: RE | Admit: 2016-04-20 | Discharge: 2016-04-20 | Disposition: A | Discharge status: Home / Self Care | Payer: BLUE CROSS/BLUE SHIELD | Source: Ambulatory Visit | Attending: Radiation Oncology | Admitting: Radiation Oncology

## 2016-04-20 DIAGNOSIS — Z510 Encounter for antineoplastic radiation therapy: Secondary | ICD-10-CM | POA: Diagnosis not present

## 2016-04-21 ENCOUNTER — Ambulatory Visit
Admission: RE | Admit: 2016-04-21 | Discharge: 2016-04-21 | Disposition: A | Discharge status: Home / Self Care | Payer: BLUE CROSS/BLUE SHIELD | Source: Ambulatory Visit | Attending: Radiation Oncology | Admitting: Radiation Oncology

## 2016-04-21 ENCOUNTER — Telehealth: Payer: Self-pay | Admitting: Nurse Practitioner

## 2016-04-21 ENCOUNTER — Other Ambulatory Visit (HOSPITAL_BASED_OUTPATIENT_CLINIC_OR_DEPARTMENT_OTHER): Payer: BLUE CROSS/BLUE SHIELD

## 2016-04-21 DIAGNOSIS — C20 Malignant neoplasm of rectum: Secondary | ICD-10-CM | POA: Diagnosis not present

## 2016-04-21 DIAGNOSIS — Z510 Encounter for antineoplastic radiation therapy: Secondary | ICD-10-CM | POA: Diagnosis not present

## 2016-04-21 LAB — CBC WITH DIFFERENTIAL/PLATELET
BASO%: 0.9 % (ref 0.0–2.0)
Basophils Absolute: 0 10*3/uL (ref 0.0–0.1)
EOS%: 2.7 % (ref 0.0–7.0)
Eosinophils Absolute: 0.1 10*3/uL (ref 0.0–0.5)
HCT: 38 % — ABNORMAL LOW (ref 38.4–49.9)
HGB: 12.7 g/dL — ABNORMAL LOW (ref 13.0–17.1)
LYMPH%: 21.5 % (ref 14.0–49.0)
MCH: 27.4 pg (ref 27.2–33.4)
MCHC: 33.5 g/dL (ref 32.0–36.0)
MCV: 81.8 fL (ref 79.3–98.0)
MONO#: 0.4 10*3/uL (ref 0.1–0.9)
MONO%: 12.3 % (ref 0.0–14.0)
NEUT%: 62.6 % (ref 39.0–75.0)
NEUTROS ABS: 2 10*3/uL (ref 1.5–6.5)
PLATELETS: 173 10*3/uL (ref 140–400)
RBC: 4.65 10*6/uL (ref 4.20–5.82)
RDW: 16.3 % — ABNORMAL HIGH (ref 11.0–14.6)
WBC: 3.3 10*3/uL — AB (ref 4.0–10.3)
lymph#: 0.7 10*3/uL — ABNORMAL LOW (ref 0.9–3.3)

## 2016-04-21 LAB — COMPREHENSIVE METABOLIC PANEL
ALT: 15 U/L (ref 0–55)
AST: 16 U/L (ref 5–34)
Albumin: 4 g/dL (ref 3.5–5.0)
Alkaline Phosphatase: 64 U/L (ref 40–150)
Anion Gap: 6 mEq/L (ref 3–11)
BILIRUBIN TOTAL: 0.61 mg/dL (ref 0.20–1.20)
BUN: 16.4 mg/dL (ref 7.0–26.0)
CO2: 28 meq/L (ref 22–29)
CREATININE: 1.2 mg/dL (ref 0.7–1.3)
Calcium: 9.4 mg/dL (ref 8.4–10.4)
Chloride: 105 mEq/L (ref 98–109)
EGFR: 64 mL/min/{1.73_m2} — ABNORMAL LOW (ref >90)
GLUCOSE: 81 mg/dL (ref 70–140)
Potassium: 4.6 mEq/L (ref 3.5–5.1)
SODIUM: 139 meq/L (ref 136–145)
TOTAL PROTEIN: 7 g/dL (ref 6.4–8.3)

## 2016-04-21 NOTE — Telephone Encounter (Signed)
left msg confirming 6/14 apt change

## 2016-04-22 ENCOUNTER — Ambulatory Visit
Admission: RE | Admit: 2016-04-22 | Discharge: 2016-04-22 | Disposition: A | Discharge status: Home / Self Care | Payer: BLUE CROSS/BLUE SHIELD | Source: Ambulatory Visit | Attending: Radiation Oncology | Admitting: Radiation Oncology

## 2016-04-22 ENCOUNTER — Telehealth: Payer: Self-pay | Admitting: Pharmacist

## 2016-04-22 DIAGNOSIS — Z510 Encounter for antineoplastic radiation therapy: Secondary | ICD-10-CM | POA: Diagnosis not present

## 2016-04-22 NOTE — Telephone Encounter (Signed)
Oral Chemotherapy Follow-Up Form  Original Start date of oral chemotherapy: 04/14/16  Called patient today to follow up regarding patient's oral chemotherapy medication: Xeloda Pt correctly reports his dose/frequency  Pt reports 0 tablets/doses missed in the last week/month.   Pt reports the following side effects:  He had n/v w/ his very 1st dose of Xeloda.  Since then he has taken Zofran 30 min prior to his doses.  No further issues.  He is not using Compazine since n/v well controlled w/ Zofran. Fatigue - very mild.  He has continued to work.  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  He has found w/ XRT that he needs a nap around lunchtime and that helps w/ energy.  Pt denies diarrhea.  He states he is "regular - to - constipated".   He denies skin changes.  He uses Eucerin cream to hands & feet QHS. No mouth sores/ulcers.  He has Biotene mouth wash if needed.  He takes great care of his teeth & gums, brushing TID.  He is not a tobacco user.  He is drinking just over 64 oz of water daily so he finds that he urinates frequently, even throughout the night.  We will f/u with pt over phone again in 2 weeks.  Thank you, Kennith Center, Pharm.D., CPP 04/22/2016@12 :41 PM Oral Chemotherapy Clinic

## 2016-04-23 ENCOUNTER — Ambulatory Visit
Admission: RE | Admit: 2016-04-23 | Discharge: 2016-04-23 | Disposition: A | Discharge status: Home / Self Care | Payer: BLUE CROSS/BLUE SHIELD | Source: Ambulatory Visit | Attending: Radiation Oncology | Admitting: Radiation Oncology

## 2016-04-23 ENCOUNTER — Encounter: Payer: Self-pay | Admitting: Radiation Oncology

## 2016-04-23 VITALS — BP 122/74 | HR 66 | Temp 97.7°F | Resp 20 | Wt 206.2 lb

## 2016-04-23 DIAGNOSIS — Z510 Encounter for antineoplastic radiation therapy: Secondary | ICD-10-CM | POA: Diagnosis not present

## 2016-04-23 DIAGNOSIS — C20 Malignant neoplasm of rectum: Secondary | ICD-10-CM

## 2016-04-23 NOTE — Progress Notes (Signed)
Weekly rad txs rectal  8/25 completed ,tenderness in rectal area, stools normal, bladder normal, takes zofran bid  Before meals, none or no gas c/o, no blood in stool, appetite good 10:37 AM BP 122/74 mmHg  Pulse 66  Temp(Src) 97.7 F (36.5 C) (Oral)  Resp 20  Wt 206 lb 3.2 oz (93.532 kg)  Wt Readings from Last 3 Encounters:  04/23/16 206 lb 3.2 oz (93.532 kg)  04/16/16 203 lb 6.4 oz (92.262 kg)  04/15/16 204 lb (92.534 kg)

## 2016-04-25 NOTE — Progress Notes (Signed)
   Department of Radiation Oncology  Phone:  [phone] Fax:        [phone]  Weekly Treatment Note    Name: Jermaine Smith Date: 04/25/2016 MRN: [id number] DOB: 20-Jul-1951   Diagnosis:     ICD-9-CM ICD-10-CM   1. Rectal cancer (HCC) 154.1 C20      Current dose: 14.4 Gy  Current fraction:8   MEDICATIONS: Current Outpatient Prescriptions  Medication Sig Dispense Refill  . aspirin 81 MG tablet Take 81 mg by mouth daily.    . capecitabine (XELODA) 150 MG tablet Take 2 tablets (300 mg total) by mouth 2 (two) times daily after a meal. 60 tablet 1  . capecitabine (XELODA) 500 MG tablet Take 3 tablets (1,500 mg total) by mouth 2 (two) times daily after a meal. 90 tablet 1  . dexlansoprazole (DEXILANT) 60 MG capsule Take 60 mg by mouth daily.    . ferrous sulfate 325 (65 FE) MG tablet Take 325 mg by mouth daily with breakfast.    . Multiple Vitamin (MULTIVITAMIN) tablet Take 1 tablet by mouth as needed.    . ondansetron (ZOFRAN) 8 MG tablet Take 1 tablet (8 mg total) by mouth every 8 (eight) hours as needed for nausea. 30 tablet 2  . prochlorperazine (COMPAZINE) 10 MG tablet Take 1 tablet (10 mg total) by mouth every 6 (six) hours as needed for nausea or vomiting. 30 tablet 1  . VITAMIN D, ERGOCALCIFEROL, PO Take 1 capsule by mouth daily.     No current facility-administered medications for this encounter.     ALLERGIES: Review of patient's allergies indicates no known allergies.   LABORATORY DATA:  Lab Results  Component Value Date   WBC 3.3* 04/21/2016   HGB 12.7* 04/21/2016   HCT 38.0* 04/21/2016   MCV 81.8 04/21/2016   PLT 173 04/21/2016   Lab Results  Component Value Date   NA 139 04/21/2016   K 4.6 04/21/2016   CL 105 03/19/2016   CO2 28 04/21/2016   Lab Results  Component Value Date   ALT 15 04/21/2016   AST 16 04/21/2016   ALKPHOS 64 04/21/2016   BILITOT 0.61 04/21/2016     NARRATIVE: Jermaine Smith was seen today for weekly treatment management. The  chart was checked and the patient's films were reviewed.  Weekly rad txs rectal  8/25 completed ,tenderness in rectal area, stools normal, bladder normal, takes zofran bid  Before meals, none or no gas c/o, no blood in stool, appetite good 2:19 PM BP 122/74 mmHg  Pulse 66  Temp(Src) 97.7 F (36.5 C) (Oral)  Resp 20  Wt 206 lb 3.2 oz (93.532 kg)  Wt Readings from Last 3 Encounters:  04/23/16 206 lb 3.2 oz (93.532 kg)  04/16/16 203 lb 6.4 oz (92.262 kg)  04/15/16 204 lb (92.534 kg)    PHYSICAL EXAMINATION: weight is 206 lb 3.2 oz (93.532 kg). His oral temperature is 97.7 F (36.5 C). His blood pressure is 122/74 and his pulse is 66. His respiration is 20.        ASSESSMENT: The patient is doing satisfactorily with treatment.  PLAN: We will continue with the patient's radiation treatment as planned.

## 2016-04-26 ENCOUNTER — Ambulatory Visit
Admission: RE | Admit: 2016-04-26 | Discharge: 2016-04-26 | Disposition: A | Discharge status: Home / Self Care | Payer: BLUE CROSS/BLUE SHIELD | Source: Ambulatory Visit | Attending: Radiation Oncology | Admitting: Radiation Oncology

## 2016-04-26 DIAGNOSIS — Z510 Encounter for antineoplastic radiation therapy: Secondary | ICD-10-CM | POA: Diagnosis not present

## 2016-04-27 ENCOUNTER — Ambulatory Visit
Admission: RE | Admit: 2016-04-27 | Discharge: 2016-04-27 | Disposition: A | Discharge status: Home / Self Care | Payer: BLUE CROSS/BLUE SHIELD | Source: Ambulatory Visit | Attending: Radiation Oncology | Admitting: Radiation Oncology

## 2016-04-27 DIAGNOSIS — Z510 Encounter for antineoplastic radiation therapy: Secondary | ICD-10-CM | POA: Diagnosis not present

## 2016-04-28 ENCOUNTER — Ambulatory Visit: Payer: BLUE CROSS/BLUE SHIELD | Admitting: Nurse Practitioner

## 2016-04-28 ENCOUNTER — Ambulatory Visit
Admission: RE | Admit: 2016-04-28 | Discharge: 2016-04-28 | Disposition: A | Discharge status: Home / Self Care | Payer: BLUE CROSS/BLUE SHIELD | Source: Ambulatory Visit | Attending: Radiation Oncology | Admitting: Radiation Oncology

## 2016-04-28 ENCOUNTER — Other Ambulatory Visit: Payer: BLUE CROSS/BLUE SHIELD

## 2016-04-28 ENCOUNTER — Ambulatory Visit (HOSPITAL_BASED_OUTPATIENT_CLINIC_OR_DEPARTMENT_OTHER): Payer: BLUE CROSS/BLUE SHIELD | Admitting: Nurse Practitioner

## 2016-04-28 ENCOUNTER — Other Ambulatory Visit (HOSPITAL_BASED_OUTPATIENT_CLINIC_OR_DEPARTMENT_OTHER): Payer: BLUE CROSS/BLUE SHIELD

## 2016-04-28 VITALS — BP 138/67 | HR 72 | Temp 98.0°F | Resp 18 | Ht 76.0 in | Wt 205.5 lb

## 2016-04-28 DIAGNOSIS — D696 Thrombocytopenia, unspecified: Secondary | ICD-10-CM

## 2016-04-28 DIAGNOSIS — C20 Malignant neoplasm of rectum: Secondary | ICD-10-CM

## 2016-04-28 DIAGNOSIS — Z510 Encounter for antineoplastic radiation therapy: Secondary | ICD-10-CM | POA: Diagnosis not present

## 2016-04-28 DIAGNOSIS — D509 Iron deficiency anemia, unspecified: Secondary | ICD-10-CM | POA: Diagnosis not present

## 2016-04-28 LAB — CBC WITH DIFFERENTIAL/PLATELET
BASO%: 0.3 % (ref 0.0–2.0)
BASOS ABS: 0 10*3/uL (ref 0.0–0.1)
EOS%: 3.9 % (ref 0.0–7.0)
Eosinophils Absolute: 0.2 10*3/uL (ref 0.0–0.5)
HEMATOCRIT: 34.2 % — AB (ref 38.4–49.9)
HGB: 12 g/dL — ABNORMAL LOW (ref 13.0–17.1)
LYMPH#: 0.5 10*3/uL — AB (ref 0.9–3.3)
LYMPH%: 13.7 % — ABNORMAL LOW (ref 14.0–49.0)
MCH: 28.8 pg (ref 27.2–33.4)
MCHC: 35.1 g/dL (ref 32.0–36.0)
MCV: 82.2 fL (ref 79.3–98.0)
MONO#: 0.4 10*3/uL (ref 0.1–0.9)
MONO%: 9 % (ref 0.0–14.0)
NEUT#: 2.8 10*3/uL (ref 1.5–6.5)
NEUT%: 73.1 % (ref 39.0–75.0)
PLATELETS: 121 10*3/uL — AB (ref 140–400)
RBC: 4.16 10*6/uL — ABNORMAL LOW (ref 4.20–5.82)
RDW: 15.6 % — ABNORMAL HIGH (ref 11.0–14.6)
WBC: 3.9 10*3/uL — ABNORMAL LOW (ref 4.0–10.3)

## 2016-04-28 LAB — COMPREHENSIVE METABOLIC PANEL
ALT: 23 U/L (ref 0–55)
ANION GAP: 7 meq/L (ref 3–11)
AST: 23 U/L (ref 5–34)
Albumin: 3.7 g/dL (ref 3.5–5.0)
Alkaline Phosphatase: 65 U/L (ref 40–150)
BUN: 12.6 mg/dL (ref 7.0–26.0)
CALCIUM: 9.3 mg/dL (ref 8.4–10.4)
CHLORIDE: 103 meq/L (ref 98–109)
CO2: 28 meq/L (ref 22–29)
CREATININE: 1.3 mg/dL (ref 0.7–1.3)
EGFR: 59 mL/min/{1.73_m2} — ABNORMAL LOW (ref >90)
Glucose: 114 mg/dl (ref 70–140)
POTASSIUM: 4.1 meq/L (ref 3.5–5.1)
Sodium: 137 mEq/L (ref 136–145)
Total Bilirubin: 0.85 mg/dL (ref 0.20–1.20)
Total Protein: 6.6 g/dL (ref 6.4–8.3)

## 2016-04-28 NOTE — Progress Notes (Signed)
  [facility] OFFICE PROGRESS NOTE   Diagnosis:  Proximal rectal cancer  CURRENT THERAPY: concurrent chemoRT with Xeloda 1847m q12h, Monday through Friday, started on 04/14/2016  INTERVAL HISTORY:   Jermaine Smith as scheduled. He continues radiation/Xeloda. No significant nausea/vomiting. He is taking Zofran twice a day. He has occasional mild nausea that is relieved with eating. No mouth sores. No diarrhea. No hand or foot pain or redness. He notes a decrease in the rectal bleeding. He continues to have pain with bowel movements. This resolves within about 30 minutes.  Objective:  Vital signs in last 24 hours:  Blood pressure 138/67, pulse 72, temperature 98 F (36.7 C), temperature source Oral, resp. rate 18, height _0  (1.93 m), weight 205 lb 8 oz (93.214 kg), SpO2 98 %.    HEENT: No thrush or ulcers. Resp: Lungs clear bilaterally. Cardio: Regular rate and rhythm. GI: Abdomen soft and nontender. No hepatomegaly. Vascular: No leg edema. Calves soft and nontender. Skin: Palms without erythema.    Lab Results:  Lab Results  Component Value Date   WBC 3.9* 04/28/2016   HGB 12.0* 04/28/2016   HCT 34.2* 04/28/2016   MCV 82.2 04/28/2016   PLT 121* 04/28/2016   NEUTROABS 2.8 04/28/2016    Imaging:  No results found.  Medications: I have reviewed the patient's current medications.  Assessment/Plan: 1. Proximal rectal adenocarcinoma, uT3N1M0, stag IIIB, MMR unknown; concurrent radiation/Xeloda initiated 04/14/2016.  2. Iron deficiency anemia related to #1. He continues oral iron. 3. Thrombocytopenia.   Disposition: Mr. WStowersappears stable. He continues radiation/Xeloda. We reviewed today's labs. We discussed the mild thrombocytopenia. He understands to contact the office with any bleeding. We will repeat a CBC in one week. He will return for a follow-up visit in 2 weeks.    TNed CardANP/GNP-BC   04/28/2016  2:48 PM

## 2016-04-29 ENCOUNTER — Ambulatory Visit
Admission: RE | Admit: 2016-04-29 | Discharge: 2016-04-29 | Disposition: A | Discharge status: Home / Self Care | Payer: BLUE CROSS/BLUE SHIELD | Source: Ambulatory Visit | Attending: Radiation Oncology | Admitting: Radiation Oncology

## 2016-04-29 DIAGNOSIS — Z510 Encounter for antineoplastic radiation therapy: Secondary | ICD-10-CM | POA: Diagnosis not present

## 2016-04-30 ENCOUNTER — Encounter: Payer: Self-pay | Admitting: Radiation Oncology

## 2016-04-30 ENCOUNTER — Ambulatory Visit
Admission: RE | Admit: 2016-04-30 | Discharge: 2016-04-30 | Disposition: A | Discharge status: Home / Self Care | Payer: BLUE CROSS/BLUE SHIELD | Source: Ambulatory Visit | Attending: Radiation Oncology | Admitting: Radiation Oncology

## 2016-04-30 VITALS — BP 123/80 | HR 67 | Temp 98.2°F | Resp 20 | Wt 206.9 lb

## 2016-04-30 DIAGNOSIS — C20 Malignant neoplasm of rectum: Secondary | ICD-10-CM

## 2016-04-30 DIAGNOSIS — Z510 Encounter for antineoplastic radiation therapy: Secondary | ICD-10-CM | POA: Diagnosis not present

## 2016-04-30 NOTE — Progress Notes (Signed)
   Department of Radiation Oncology  Phone:  [phone] Fax:        [phone]  Weekly Treatment Note    Name: Jermaine Smith Date: 04/30/2016 MRN: [id number] DOB: [birth year]   Diagnosis:   No diagnosis found.   Current dose: 23.4 Gy  Current fraction: 13   MEDICATIONS: Current Outpatient Prescriptions  Medication Sig Dispense Refill  . aspirin 81 MG tablet Take 81 mg by mouth daily.    . capecitabine (XELODA) 150 MG tablet Take 2 tablets (300 mg total) by mouth 2 (two) times daily after a meal. 60 tablet 1  . capecitabine (XELODA) 500 MG tablet Take 3 tablets (1,500 mg total) by mouth 2 (two) times daily after a meal. 90 tablet 1  . dexlansoprazole (DEXILANT) 60 MG capsule Take 60 mg by mouth daily.    . ferrous sulfate 325 (65 FE) MG tablet Take 325 mg by mouth daily with breakfast.    . Multiple Vitamin (MULTIVITAMIN) tablet Take 1 tablet by mouth as needed.    . ondansetron (ZOFRAN) 8 MG tablet Take 1 tablet (8 mg total) by mouth every 8 (eight) hours as needed for nausea. 30 tablet 2  . prochlorperazine (COMPAZINE) 10 MG tablet Take 1 tablet (10 mg total) by mouth every 6 (six) hours as needed for nausea or vomiting. (Patient not taking: Reported on 04/28/2016) 30 tablet 1  . VITAMIN D, ERGOCALCIFEROL, PO Take 1 capsule by mouth daily.     No current facility-administered medications for this encounter.     ALLERGIES: Review of patient's allergies indicates no known allergies.   LABORATORY DATA:  Lab Results  Component Value Date   WBC 3.9* 04/28/2016   HGB 12.0* 04/28/2016   HCT 34.2* 04/28/2016   MCV 82.2 04/28/2016   PLT 121* 04/28/2016   Lab Results  Component Value Date   NA 137 04/28/2016   K 4.1 04/28/2016   CL 105 03/19/2016   CO2 28 04/28/2016   Lab Results  Component Value Date   ALT 23 04/28/2016   AST 23 04/28/2016   ALKPHOS 65 04/28/2016   BILITOT 0.85 04/28/2016     NARRATIVE: Jermaine Smith was seen today for weekly treatment management.  The chart was checked and the patient's films were reviewed.  He has 1-2 bowel movements daily. He reports bowel movements burn like razors. He is tender in that area. He takes Zofran bid and it is keeping nausea at [place]. He akes xeloda bid. Has a good appetite, but has taste changes. No fatigue stated.  7:58 AM There were no vitals taken for this visit.  Wt Readings from Last 3 Encounters:  04/28/16 205 lb 8 oz (93.214 kg)  04/23/16 206 lb 3.2 oz (93.532 kg)  04/16/16 203 lb 6.4 oz (92.262 kg)    PHYSICAL EXAMINATION: vitals were not taken for this visit.       ASSESSMENT: The patient is doing satisfactorily with treatment.  PLAN: We will continue with the patient's radiation treatment as planned.   ------------------------------------------------  Jodelle Gross, MD, PhD  This document serves as a record of services personally performed by Kyung Rudd, MD. It was created on his behalf by Darcus Austin, a trained medical scribe. The creation of this record is based on the scribe's personal observations and the provider's statements to them. This document has been checked and approved by the attending provider.

## 2016-04-30 NOTE — Progress Notes (Signed)
Weekly rad txs rectum , having 1-2 bowel movements daily, burns like razors stated, takes nausea zofran  Medication bid, keeping it at [place], takes xeloda bid, appetite cgood with taste changes though, no fatigue stated 10:58 AM BP 123/80 mmHg  Pulse 67  Temp(Src) 98.2 F (36.8 C) (Oral)  Resp 20  Wt 206 lb 14.4 oz (93.849 kg)  Wt Readings from Last 3 Encounters:  04/30/16 206 lb 14.4 oz (93.849 kg)  04/28/16 205 lb 8 oz (93.214 kg)  04/23/16 206 lb 3.2 oz (93.532 kg)

## 2016-05-03 ENCOUNTER — Ambulatory Visit
Admission: RE | Admit: 2016-05-03 | Discharge: 2016-05-03 | Disposition: A | Discharge status: Home / Self Care | Payer: BLUE CROSS/BLUE SHIELD | Source: Ambulatory Visit | Attending: Radiation Oncology | Admitting: Radiation Oncology

## 2016-05-03 ENCOUNTER — Telehealth: Payer: Self-pay | Admitting: *Deleted

## 2016-05-03 DIAGNOSIS — Z510 Encounter for antineoplastic radiation therapy: Secondary | ICD-10-CM | POA: Diagnosis not present

## 2016-05-03 NOTE — Telephone Encounter (Signed)
Oncology Nurse Navigator Documentation  Oncology Nurse Navigator Flowsheets 05/03/2016  Navigator Location [facility]  Navigator Encounter Type Telephone  Telephone Outgoing Call  Abnormal Finding Date 03/19/2016  Confirmed Diagnosis Date 03/19/2016  Treatment Initiated Date 04/14/2016  Patient Visit Type -  Treatment Phase Active Tx--RT/Xeloda  Barriers/Navigation Needs -  Education -  Interventions Other--left VM on mobile to inquire if any adverse effect from treatment, needs, concerns or questions. Left navigator direct contact #.  Coordination of Care -  Education Method -  Support Groups/Services -  Acuity Level 1  Time Spent with Patient 15  Missed seeing him in RT today (he came early).

## 2016-05-04 ENCOUNTER — Ambulatory Visit
Admission: RE | Admit: 2016-05-04 | Discharge: 2016-05-04 | Disposition: A | Discharge status: Home / Self Care | Payer: BLUE CROSS/BLUE SHIELD | Source: Ambulatory Visit | Attending: Radiation Oncology | Admitting: Radiation Oncology

## 2016-05-04 DIAGNOSIS — Z510 Encounter for antineoplastic radiation therapy: Secondary | ICD-10-CM | POA: Diagnosis not present

## 2016-05-05 ENCOUNTER — Other Ambulatory Visit (HOSPITAL_BASED_OUTPATIENT_CLINIC_OR_DEPARTMENT_OTHER): Payer: BLUE CROSS/BLUE SHIELD

## 2016-05-05 ENCOUNTER — Ambulatory Visit
Admission: RE | Admit: 2016-05-05 | Discharge: 2016-05-05 | Disposition: A | Discharge status: Home / Self Care | Payer: BLUE CROSS/BLUE SHIELD | Source: Ambulatory Visit | Attending: Radiation Oncology | Admitting: Radiation Oncology

## 2016-05-05 DIAGNOSIS — C20 Malignant neoplasm of rectum: Secondary | ICD-10-CM

## 2016-05-05 DIAGNOSIS — Z510 Encounter for antineoplastic radiation therapy: Secondary | ICD-10-CM | POA: Diagnosis not present

## 2016-05-05 LAB — COMPREHENSIVE METABOLIC PANEL
ALBUMIN: 3.7 g/dL (ref 3.5–5.0)
ALT: 23 U/L (ref 0–55)
ANION GAP: 9 meq/L (ref 3–11)
AST: 18 U/L (ref 5–34)
Alkaline Phosphatase: 67 U/L (ref 40–150)
BILIRUBIN TOTAL: 0.82 mg/dL (ref 0.20–1.20)
BUN: 14.3 mg/dL (ref 7.0–26.0)
CO2: 24 meq/L (ref 22–29)
CREATININE: 1.2 mg/dL (ref 0.7–1.3)
Calcium: 9.3 mg/dL (ref 8.4–10.4)
Chloride: 106 mEq/L (ref 98–109)
EGFR: 64 mL/min/{1.73_m2} — ABNORMAL LOW (ref >90)
Glucose: 105 mg/dl (ref 70–140)
Potassium: 4.1 mEq/L (ref 3.5–5.1)
Sodium: 139 mEq/L (ref 136–145)
TOTAL PROTEIN: 6.9 g/dL (ref 6.4–8.3)

## 2016-05-05 LAB — CBC WITH DIFFERENTIAL/PLATELET
BASO%: 0.7 % (ref 0.0–2.0)
Basophils Absolute: 0 10*3/uL (ref 0.0–0.1)
EOS%: 3.1 % (ref 0.0–7.0)
Eosinophils Absolute: 0.1 10*3/uL (ref 0.0–0.5)
HEMATOCRIT: 35.9 % — AB (ref 38.4–49.9)
HEMOGLOBIN: 12.3 g/dL — AB (ref 13.0–17.1)
LYMPH%: 9.5 % — AB (ref 14.0–49.0)
MCH: 28.4 pg (ref 27.2–33.4)
MCHC: 34.2 g/dL (ref 32.0–36.0)
MCV: 83.2 fL (ref 79.3–98.0)
MONO#: 0.5 10*3/uL (ref 0.1–0.9)
MONO%: 11 % (ref 0.0–14.0)
NEUT%: 75.7 % — ABNORMAL HIGH (ref 39.0–75.0)
NEUTROS ABS: 3.2 10*3/uL (ref 1.5–6.5)
PLATELETS: 150 10*3/uL (ref 140–400)
RBC: 4.32 10*6/uL (ref 4.20–5.82)
RDW: 16.2 % — ABNORMAL HIGH (ref 11.0–14.6)
WBC: 4.3 10*3/uL (ref 4.0–10.3)
lymph#: 0.4 10*3/uL — ABNORMAL LOW (ref 0.9–3.3)

## 2016-05-06 ENCOUNTER — Ambulatory Visit
Admission: RE | Admit: 2016-05-06 | Discharge: 2016-05-06 | Disposition: A | Discharge status: Home / Self Care | Payer: BLUE CROSS/BLUE SHIELD | Source: Ambulatory Visit | Attending: Radiation Oncology | Admitting: Radiation Oncology

## 2016-05-06 ENCOUNTER — Telehealth: Payer: Self-pay | Admitting: *Deleted

## 2016-05-06 DIAGNOSIS — Z510 Encounter for antineoplastic radiation therapy: Secondary | ICD-10-CM | POA: Diagnosis not present

## 2016-05-06 NOTE — Telephone Encounter (Signed)
Received call from wife requesting a call back from nurse.  Spoke with wife, and was informed that pt does have BMs daily but painful.   Wife wanted to know if it would help with stool softener.  Gave wife suggestions for Senna-S,  Colace, and  Eating prunes, applesauce, and continue with drinking lots of water as tolerated.  Wife voiced understanding. Wife's    Phone    [phone].

## 2016-05-07 ENCOUNTER — Ambulatory Visit
Admission: RE | Admit: 2016-05-07 | Discharge: 2016-05-07 | Disposition: A | Discharge status: Home / Self Care | Payer: BLUE CROSS/BLUE SHIELD | Source: Ambulatory Visit | Attending: Radiation Oncology | Admitting: Radiation Oncology

## 2016-05-07 ENCOUNTER — Encounter: Payer: Self-pay | Admitting: Radiation Oncology

## 2016-05-07 VITALS — BP 124/82 | HR 86 | Temp 98.2°F | Resp 18 | Wt 205.2 lb

## 2016-05-07 DIAGNOSIS — Z510 Encounter for antineoplastic radiation therapy: Secondary | ICD-10-CM | POA: Diagnosis not present

## 2016-05-07 DIAGNOSIS — C20 Malignant neoplasm of rectum: Secondary | ICD-10-CM

## 2016-05-07 NOTE — Progress Notes (Signed)
  Radiation Oncology         [phone]   Name: Jermaine Smith MRN: [id number]   Date: 05/07/2016  DOB: [birth year]   Weekly Radiation Therapy Management    ICD-9-CM ICD-10-CM   1. Rectal cancer (HCC) 154.1 C20     Current Dose: 32.4 Gy  Planned Dose:  50.4 Gy  Narrative The patient presents for routine under treatment assessment.  Weekly rad txs rectum 18/28 completed. He reports hard, small, formed stools. He started colace yesterday and drinks prune juice. Bowel movements are painful, but resolves within a few minutes. Denies bleeding, dysuria, using baby wipes prn, and has a fair appetite. He is taking Xeloda bid. He reports feeling that his abdomen is "buzzing."  Set-up films were reviewed. The chart was checked.  Physical Findings  weight is 205 lb 3.2 oz (93.078 kg). His oral temperature is 98.2 F (36.8 C). His blood pressure is 124/82 and his pulse is 86. His respiration is 18. . Weight essentially stable.  No significant changes.  Impression The patient is tolerating radiation.  Plan Continue treatment as planned.      Sheral Apley Tammi Klippel, M.D.  This document serves as a record of services personally performed by Tyler Pita, MD. It was created on his behalf by Darcus Austin, a trained medical scribe. The creation of this record is based on the scribe's personal observations and the provider's statements to them. This document has been checked and approved by the attending provider.

## 2016-05-07 NOTE — Progress Notes (Signed)
Weekly rad txs rectum 18/28 completed, having hard small formed stools, started colace yesterday and prune juice, is taking Xeloda bid, bowel movements painful but resolves within a few minutes no bleeding  No dysuria ,using baby wipes prn, appetite fair,  10:44 AM BP 124/82 mmHg  Pulse 86  Temp(Src) 98.2 F (36.8 C) (Oral)  Resp 18  Wt 205 lb 3.2 oz (93.078 kg)  Wt Readings from Last 3 Encounters:  05/07/16 205 lb 3.2 oz (93.078 kg)  04/30/16 206 lb 14.4 oz (93.849 kg)  04/28/16 205 lb 8 oz (93.214 kg)

## 2016-05-10 ENCOUNTER — Ambulatory Visit
Admission: RE | Admit: 2016-05-10 | Discharge: 2016-05-10 | Disposition: A | Discharge status: Home / Self Care | Payer: BLUE CROSS/BLUE SHIELD | Source: Ambulatory Visit | Attending: Radiation Oncology | Admitting: Radiation Oncology

## 2016-05-10 DIAGNOSIS — Z510 Encounter for antineoplastic radiation therapy: Secondary | ICD-10-CM | POA: Diagnosis not present

## 2016-05-11 ENCOUNTER — Encounter: Payer: Self-pay | Admitting: Radiation Oncology

## 2016-05-11 ENCOUNTER — Ambulatory Visit
Admission: RE | Admit: 2016-05-11 | Discharge: 2016-05-11 | Disposition: A | Discharge status: Home / Self Care | Payer: BLUE CROSS/BLUE SHIELD | Source: Ambulatory Visit | Attending: Radiation Oncology | Admitting: Radiation Oncology

## 2016-05-11 DIAGNOSIS — Z510 Encounter for antineoplastic radiation therapy: Secondary | ICD-10-CM | POA: Diagnosis not present

## 2016-05-12 ENCOUNTER — Encounter: Payer: Self-pay | Admitting: Hematology

## 2016-05-12 ENCOUNTER — Ambulatory Visit
Admission: RE | Admit: 2016-05-12 | Discharge: 2016-05-12 | Disposition: A | Discharge status: Home / Self Care | Payer: BLUE CROSS/BLUE SHIELD | Source: Ambulatory Visit | Attending: Radiation Oncology | Admitting: Radiation Oncology

## 2016-05-12 ENCOUNTER — Other Ambulatory Visit (HOSPITAL_BASED_OUTPATIENT_CLINIC_OR_DEPARTMENT_OTHER): Payer: BLUE CROSS/BLUE SHIELD

## 2016-05-12 ENCOUNTER — Ambulatory Visit (HOSPITAL_BASED_OUTPATIENT_CLINIC_OR_DEPARTMENT_OTHER): Payer: BLUE CROSS/BLUE SHIELD | Admitting: Hematology

## 2016-05-12 VITALS — BP 138/69 | HR 62 | Temp 97.8°F | Resp 18 | Ht 76.0 in | Wt 204.5 lb

## 2016-05-12 DIAGNOSIS — C20 Malignant neoplasm of rectum: Secondary | ICD-10-CM

## 2016-05-12 DIAGNOSIS — Z510 Encounter for antineoplastic radiation therapy: Secondary | ICD-10-CM | POA: Diagnosis not present

## 2016-05-12 DIAGNOSIS — R21 Rash and other nonspecific skin eruption: Secondary | ICD-10-CM | POA: Diagnosis not present

## 2016-05-12 DIAGNOSIS — K5900 Constipation, unspecified: Secondary | ICD-10-CM

## 2016-05-12 DIAGNOSIS — D509 Iron deficiency anemia, unspecified: Secondary | ICD-10-CM

## 2016-05-12 LAB — CBC WITH DIFFERENTIAL/PLATELET
BASO%: 0.6 % (ref 0.0–2.0)
Basophils Absolute: 0 10*3/uL (ref 0.0–0.1)
EOS ABS: 0.1 10*3/uL (ref 0.0–0.5)
EOS%: 3.7 % (ref 0.0–7.0)
HCT: 35.8 % — ABNORMAL LOW (ref 38.4–49.9)
HGB: 12.4 g/dL — ABNORMAL LOW (ref 13.0–17.1)
LYMPH%: 11.4 % — AB (ref 14.0–49.0)
MCH: 29.1 pg (ref 27.2–33.4)
MCHC: 34.6 g/dL (ref 32.0–36.0)
MCV: 84 fL (ref 79.3–98.0)
MONO#: 0.5 10*3/uL (ref 0.1–0.9)
MONO%: 14.5 % — AB (ref 0.0–14.0)
NEUT%: 69.8 % (ref 39.0–75.0)
NEUTROS ABS: 2.5 10*3/uL (ref 1.5–6.5)
Platelets: 147 10*3/uL (ref 140–400)
RBC: 4.26 10*6/uL (ref 4.20–5.82)
RDW: 18.4 % — AB (ref 11.0–14.6)
WBC: 3.5 10*3/uL — AB (ref 4.0–10.3)
lymph#: 0.4 10*3/uL — ABNORMAL LOW (ref 0.9–3.3)

## 2016-05-12 LAB — COMPREHENSIVE METABOLIC PANEL
ALBUMIN: 3.8 g/dL (ref 3.5–5.0)
ALK PHOS: 82 U/L (ref 40–150)
ALT: 18 U/L (ref 0–55)
AST: 18 U/L (ref 5–34)
Anion Gap: 9 mEq/L (ref 3–11)
BILIRUBIN TOTAL: 0.69 mg/dL (ref 0.20–1.20)
BUN: 12.2 mg/dL (ref 7.0–26.0)
CO2: 24 meq/L (ref 22–29)
CREATININE: 1.1 mg/dL (ref 0.7–1.3)
Calcium: 9.4 mg/dL (ref 8.4–10.4)
Chloride: 106 mEq/L (ref 98–109)
EGFR: 68 mL/min/{1.73_m2} — ABNORMAL LOW (ref >90)
GLUCOSE: 101 mg/dL (ref 70–140)
Potassium: 4.1 mEq/L (ref 3.5–5.1)
SODIUM: 139 meq/L (ref 136–145)
TOTAL PROTEIN: 7 g/dL (ref 6.4–8.3)

## 2016-05-12 NOTE — Progress Notes (Signed)
[facility]  [phone] [phone]  Clinic Follow Up Note   Patient Care Team: Robert Reade, MD as PCP - General (Family Medicine) Malcolm T Stark, MD as Consulting Physician (Gastroenterology) Alicia Thomas, MD as Consulting Physician (General Surgery) Yan Feng, MD as Consulting Physician (Hematology) John Moody, MD as Consulting Physician (Radiation Oncology) 05/12/2016   CHIEF COMPLAINTS:  Follow up proximal rectal cancer  HISTORY OF PRESENTING ILLNESS (04/02/2016):  Jermaine Smith 64 y.o. male is here because of his recently diagnosed rectal adenocarcinoma. He is accompanied by his wife to our multidisciplinary GI clinic today.  He has been having rectal bleeding for the past 4 months, intermittent, small amount, mixed with stool, BM is regular, but smaller caliber, no rectal pain, nausea, bloating, he has lost 5 lbs during his recent colonoscopy procedures, his appetite is normal, has been eating less, since the diagnosis of cancer. His appetite and energy level are normal. He saw gastroenterologist Dr. Stark in the past for his GERD, and self-referred back to Dr. Stark and underwent colonoscopy in early May 2017. The colonoscopy showed a polyp in sigmoid colon, and a fungating mass in the proximal rectum, biopsy showed adenocarcinoma. He underwent EUS Colonoscopy, which showed a T3 N1 disease. CT scan was negative for distant metastasis.  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 He has been very healthy, only takes PPI for acid reflux and baby aspirin. Exercise regularly. He feels well well overall.  CURRENT THERAPY: concurrent chemoRT with Xeloda 1800mg q12h, Monday through Friday, started on 04/14/2016  INTERIM HISTORY: Mr. Risk returns for follow up. He is tolerating chemoradiation well overall. He has mild fatigue, mild burning sensation at the rectum, no significant diarrhea or other complaints. He noticed some  skin rash at the inner upper of his thighs, itching, no new medications lately. He has good appetite and eating well, no significant weight loss since he started treatment. He is taking iron pill, tolerating well, mild constipation.  MEDICAL HISTORY:  Past Medical History  Diagnosis Date  . Left inguinal hernia   . Hiatal hernia   . GERD (gastroesophageal reflux disease)   . FHx: brain aneurysm   . Skin cancer     hx basal cell carcinoma    SURGICAL HISTORY: Past Surgical History  Procedure Laterality Date  . Inguinal hernia repair  1961    open right inguinal age 9 y/o  . Lap bilateral ing. hernia repair Bilateral 05/09/12  . Eus N/A 03/25/2016    Procedure: LOWER ENDOSCOPIC ULTRASOUND (EUS);  Surgeon: Daniel P Jacobs, MD;  Location: [facility];  Service: Endoscopy;  Laterality: N/A;  . Clavicle surgery      SOCIAL HISTORY: Social History   Social History  . Marital Status: Married    Spouse Name: N/A  . Number of Children: 2  . Years of Education: N/A   Occupational History  . endoscope specialist     Works on endoscopic/laparoscopic equipment repair/devt   Social History Main Topics  . Smoking status: Former Smoker -- 0.50 packs/day for 5 years    Types: Cigarettes  . Smokeless tobacco: Never Used  . Alcohol Use: 0.0 oz/week    0 Standard drinks or equivalent per week     Comment: ocassional, once a month   . Drug Use: No     Comment: still smoking 1ppd,had quit in 1978,   . Sexual Activity: Not on file   Other Topics   Concern  . 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   FAMILY HISTORY: Family History  Problem Relation Age of Onset  . Heart disease Father   . Clotting disorder Mother     PE  . Hypotension Mother   . Colon cancer Neg Hx                              He has two adopted  children, 83 and 108 yo  ALLERGIES:  has No Known Allergies.  MEDICATIONS:  Current Outpatient Prescriptions  Medication Sig Dispense Refill  . aspirin 81 MG tablet Take 81 mg by mouth daily.    . capecitabine (XELODA) 150 MG tablet Take 2 tablets (300 mg total) by mouth 2 (two) times daily after a meal. 60 tablet 1  . capecitabine (XELODA) 500 MG tablet Take 3 tablets (1,500 mg total) by mouth 2 (two) times daily after a meal. 90 tablet 1  . dexlansoprazole (DEXILANT) 60 MG capsule Take 60 mg by mouth daily.    Marland Kitchen docusate sodium (COLACE) 100 MG capsule Take 100 mg by mouth daily.    . ferrous sulfate 325 (65 FE) MG tablet Take 325 mg by mouth daily with breakfast.    . Multiple Vitamin (MULTIVITAMIN) tablet Take 1 tablet by mouth as needed.    . ondansetron (ZOFRAN) 8 MG tablet Take 1 tablet (8 mg total) by mouth every 8 (eight) hours as needed for nausea. 30 tablet 2  . prochlorperazine (COMPAZINE) 10 MG tablet Take 1 tablet (10 mg total) by mouth every 6 (six) hours as needed for nausea or vomiting. (Patient not taking: Reported on 04/30/2016) 30 tablet 1  . VITAMIN D, ERGOCALCIFEROL, PO Take 1 capsule by mouth daily.     No current facility-administered medications for this visit.    REVIEW OF SYSTEMS:   Constitutional: Denies fevers, chills or abnormal night sweats Eyes: Denies blurriness of vision, double vision or watery eyes Ears, nose, mouth, throat, and face: Denies mucositis or sore throat Respiratory: Denies cough, dyspnea or wheezes Cardiovascular: Denies palpitation, chest discomfort or lower extremity swelling Gastrointestinal:  Denies nausea, heartburn or change in bowel habits Skin: Denies abnormal skin rashes Lymphatics: Denies new lymphadenopathy or easy bruising Neurological:Denies numbness, tingling or new weaknesses Behavioral/Psych: Mood is stable, no new changes  All other systems were reviewed with the patient and are negative.  PHYSICAL EXAMINATION: ECOG  PERFORMANCE STATUS: 0 - Asymptomatic  Filed Vitals:   05/12/16 1109  BP: 138/69  Pulse: 62  Temp: 97.8 F (36.6 C)  Resp: 18   Filed Weights   05/12/16 1109  Weight: 204 lb 8 oz (92.761 kg)    GENERAL:alert, no distress and comfortable SKIN: skin color, texture, turgor are normal, no rashes or significant lesions except some scatter macular rash at the upper inner thighs.  EYES: normal, conjunctiva are pink and non-injected, sclera clear OROPHARYNX:no exudate, no erythema and lips, buccal mucosa, and tongue normal  NECK: supple, thyroid normal size, non-tender, without nodularity LYMPH:  no palpable lymphadenopathy in the cervical, axillary or inguinal LUNGS: clear to auscultation and percussion with normal breathing effort HEART: regular rate & rhythm and no murmurs and no lower extremity edema ABDOMEN:abdomen soft, non-tender and normal bowel sounds, No organomegaly.  Rectal exam was not performed. Musculoskeletal:no cyanosis of digits and no clubbing  PSYCH: alert & oriented x 3 with fluent speech NEURO: no focal motor/sensory deficits  LABORATORY DATA:  I have reviewed the data as listed CBC Latest Ref Rng 05/12/2016 05/05/2016 04/28/2016  WBC 4.0 - 10.3 10e3/uL 3.5(L) 4.3 3.9(L)  Hemoglobin 13.0 - 17.1 g/dL 12.4(L) 12.3(L) 12.0(L)  Hematocrit 38.4 - 49.9 % 35.8(L) 35.9(L) 34.2(L)  Platelets 140 - 400 10e3/uL 147 150 121(L)    CMP Latest Ref Rng 05/12/2016 05/05/2016 04/28/2016  Glucose 70 - 140 mg/dl 101 105 114  BUN 7.0 - 26.0 mg/dL 12.2 14.3 12.6  Creatinine 0.7 - 1.3 mg/dL 1.1 1.2 1.3  Sodium 136 - 145 mEq/L 139 139 137  Potassium 3.5 - 5.1 mEq/L 4.1 4.1 4.1  CO2 22 - 29 mEq/L _0 Calcium 8.4 - 10.4 mg/dL 9.4 9.3 9.3  Total Protein 6.4 - 8.3 g/dL 7.0 6.9 6.6  Total Bilirubin 0.20 - 1.20 mg/dL 0.69 0.82 0.85  Alkaline Phos 40 - 150 U/L 82 67 65  AST 5 - 34 U/L _1 ALT 0 - 55 U/L _2 Results for CHEO, SELVEY (MRN [id number]) as of 04/18/2016 17:06   Ref. Range 03/19/2016 15:38 04/15/2016 13:50  Iron Latest Ref Range: 42-163 ug/dL 44 255 (H)  UIBC Latest Ref Range: 117-376 ug/dL  56 (L)  TIBC Latest Ref Range: 202-409 ug/dL  311  %SAT Latest Ref Range: 20-55 %  82 (H)  Saturation Ratios Latest Ref Range: 20.0-50.0 % 10.4 (L)   Ferritin Latest Ref Range: 22-316 ng/ml 7.0 (L) 17 (L)     CEA (ng/ml) 03/19/2016: 1.1 04/15/2016: 1.9   Diagnosis 03/19/2016 1. Colon, polyp(s), sigmoid - TUBULAR ADENOMA(S). - HIGH GRADE DYSPLASIA IS NOT IDENTIFIED. 2. Rectum, biopsy, mass - INVASIVE ADENOCARCINOMA.  RADIOGRAPHIC STUDIES: I have personally reviewed the radiological images as listed and agreed with the findings in the report. No results found. EUS by Dr. Ardis Hughs 03/25/2016 Endosonographic Finding 1. The mass above correlated with a hyoechoic mass that clearly invades into and through the muscularis propria layer of the rectal wall (uT3). 2. The was one small (89m) suspicious perirectal lymphnode that was suspicious for malignant involvement (uN1) 3. The right obturator lymphnode that was described on recent CT scan was not visible on this exam 5cm long, non-circumferential uT3N1 (stage IIIb) proximal rectal adenocarcinoma with distal edge located 9-10cm from the anal verge. Following EUS staging, the mass was labeled with submucosal injection of SPOT.   COLONOSCOPY by Dr. SFuller Plan5/03/2016 Findings:  The digital rectal exam was normal. - A fungating partially obstructing mass was found in the proximal rectum. The mass was partially circumferential (involving two-thirds of the lumen circumference). The mass measured six cm in length by 4 cm in width. It extended from 10-16 cm from the anal verge. Oozing was present. This was biopsied with a cold forceps for histology. - A 10 mm polyp was found in the sigmoid colon. The polyp was semi-pedunculated. The polyp was removed with a hot snare. Resection and retrieval were complete. - Internal  hemorrhoids were found during retroflexion. The hemorrhoids were small and Grade I (internal hemorrhoids that do not prolapse). - The exam was otherwise normal throughout the examined colon.  IMPRESSION: - Malignant partially obstructing tumor in the proximal rectum. Biopsied. - One 10 mm polyp in the sigmoid colon, removed with a hot snare. Resected and retrieved. - Internal hemorrhoids.  ASSESSMENT & PLAN: 65 year old occasion male, presented with mild intermittent rectal bleeding for 4 months.  1. Proximal rectal adenocarcinoma, uT3N1M0, stag IIIB, MMR unknown  -I discussed his CT scan finding, colonoscopy and biopsy results in great details with patient and his wife. -Staging results was discussed with him also, the risk of cancer recurrence for stage III rectal cancer is high, the standard care is neoadjuvant chemotherapy and radiation, followed by surgery and adjuvant chemotherapy. This was discussed with him in great details -I recommend oral capecitabine 825 mg/m twice daily with concurrent radiation. The alternative option of intravenous 5-FU continuous infusion were discussed with him also. He prefers capecitabine due to the convenience. -he has been tolerating concurrent chemo RT well, we'll continue -He takes Zofran twice daily to prevent nausea -I encouraged him to use MiraLAX for his constipation.  2. Iron deficient anemia from rectal bleeding. -He had mild anemia with hemoglobin 11.9 when he was diagnosed with rectal cancer. -His iron study showed low ferritin  and transferrin saturation, consistent with iron deficiency. This is from his rectal bleeding. -He has started ferrous sulfate twice daily, I encouraged him to take the pill with orange juice or vitamin C after meal, he is tolerating well  -Mild anemia is overall stable  3. Constipation -He takes Colace, I encouraged him to use MiraLAX as needed for constipation  4. Skin rash below groin  -Possible skin fungal  infection -I recommend him to keep groin area dry, and use topical antifungal cream over-the-counter   Plan -continue concurrent chemoRT -lab weekly -He will see me back on his last day of treatment on 7/10   All questions were answered. The patient knows to call the clinic with any problems, questions or concerns. I spent 20 minutes counseling the patient face to face. The total time spent in the appointment was 25 minutes and more than 50% was on counseling.    Truitt Merle, MD 05/12/2016 7:27 AM

## 2016-05-13 ENCOUNTER — Telehealth: Payer: Self-pay | Admitting: Hematology

## 2016-05-13 ENCOUNTER — Encounter: Payer: Self-pay | Admitting: Hematology

## 2016-05-13 ENCOUNTER — Ambulatory Visit
Admission: RE | Admit: 2016-05-13 | Discharge: 2016-05-13 | Disposition: A | Discharge status: Home / Self Care | Payer: BLUE CROSS/BLUE SHIELD | Source: Ambulatory Visit | Attending: Radiation Oncology | Admitting: Radiation Oncology

## 2016-05-13 DIAGNOSIS — Z510 Encounter for antineoplastic radiation therapy: Secondary | ICD-10-CM | POA: Diagnosis not present

## 2016-05-13 NOTE — Telephone Encounter (Signed)
s.w. pt and advised on July appt....pt ok and aware °

## 2016-05-14 ENCOUNTER — Ambulatory Visit
Admission: RE | Admit: 2016-05-14 | Discharge: 2016-05-14 | Disposition: A | Discharge status: Home / Self Care | Payer: BLUE CROSS/BLUE SHIELD | Source: Ambulatory Visit | Attending: Radiation Oncology | Admitting: Radiation Oncology

## 2016-05-14 ENCOUNTER — Encounter: Payer: Self-pay | Admitting: Radiation Oncology

## 2016-05-14 VITALS — BP 132/65 | HR 71 | Temp 97.5°F | Resp 18 | Ht 76.0 in | Wt 205.4 lb

## 2016-05-14 DIAGNOSIS — Z510 Encounter for antineoplastic radiation therapy: Secondary | ICD-10-CM | POA: Diagnosis not present

## 2016-05-14 DIAGNOSIS — C20 Malignant neoplasm of rectum: Secondary | ICD-10-CM

## 2016-05-14 NOTE — Progress Notes (Signed)
Weekly rad txs rectum 23/28 completed, having  formed stools, started colace yesterday and prune juice, is taking Xeloda bid, bowel movements less painful. No dysuria ,using baby wipes prn, appetite has improved over the past week.  Having moderate fatigue.  Wt Readings from Last 3 Encounters:  05/14/16 205 lb 6.4 oz (93.169 kg)  05/12/16 204 lb 8 oz (92.761 kg)  05/07/16 205 lb 3.2 oz (93.078 kg)  BP 132/65 mmHg  Pulse 71  Temp(Src) 97.5 F (36.4 C) (Oral)  Resp 18  Ht 6\' 4"  (1.93 m)  Wt 205 lb 6.4 oz (93.169 kg)  BMI 25.01 kg/m2  SpO2 100%

## 2016-05-14 NOTE — Progress Notes (Signed)
Department of Radiation Oncology  Phone:  [phone] Fax:        [phone]  Weekly Treatment Note    Name: Jermaine Smith Date: 05/14/2016 MRN: [id number] DOB: August 04, 1951   Diagnosis:     ICD-9-CM ICD-10-CM   1. Rectal cancer (HCC) 154.1 C20      Current dose: 41.4 Gy  Current fraction: 23   MEDICATIONS: Current Outpatient Prescriptions  Medication Sig Dispense Refill  . aspirin 81 MG tablet Take 81 mg by mouth daily.    . capecitabine (XELODA) 150 MG tablet Take 2 tablets (300 mg total) by mouth 2 (two) times daily after a meal. 60 tablet 1  . capecitabine (XELODA) 500 MG tablet Take 3 tablets (1,500 mg total) by mouth 2 (two) times daily after a meal. 90 tablet 1  . dexlansoprazole (DEXILANT) 60 MG capsule Take 60 mg by mouth daily.    Marland Kitchen docusate sodium (COLACE) 100 MG capsule Take 100 mg by mouth daily.    . ferrous sulfate 325 (65 FE) MG tablet Take 325 mg by mouth daily with breakfast.    . Multiple Vitamin (MULTIVITAMIN) tablet Take 1 tablet by mouth as needed.    . ondansetron (ZOFRAN) 8 MG tablet Take 1 tablet (8 mg total) by mouth every 8 (eight) hours as needed for nausea. 30 tablet 2  . VITAMIN D, ERGOCALCIFEROL, PO Take 1 capsule by mouth daily.    . prochlorperazine (COMPAZINE) 10 MG tablet Take 1 tablet (10 mg total) by mouth every 6 (six) hours as needed for nausea or vomiting. (Patient not taking: Reported on 04/30/2016) 30 tablet 1   No current facility-administered medications for this encounter.     ALLERGIES: Review of patient's allergies indicates no known allergies.   LABORATORY DATA:  Lab Results  Component Value Date   WBC 3.5* 05/12/2016   HGB 12.4* 05/12/2016   HCT 35.8* 05/12/2016   MCV 84.0 05/12/2016   PLT 147 05/12/2016   Lab Results  Component Value Date   NA 139 05/12/2016   K 4.1 05/12/2016   CL 105 03/19/2016   CO2 24 05/12/2016   Lab Results  Component Value Date   ALT 18 05/12/2016   AST 18 05/12/2016   ALKPHOS 82  05/12/2016   BILITOT 0.69 05/12/2016     NARRATIVE: Jermaine Smith was seen today for weekly treatment management. The chart was checked and the patient's films were reviewed.  Weekly rad txs rectum 23/28 completed, having  formed stools, started colace yesterday and prune juice, is taking Xeloda bid, bowel movements less painful. No dysuria ,using baby wipes prn, appetite has improved over the past week.  Having moderate fatigue.  Wt Readings from Last 3 Encounters:  05/14/16 205 lb 6.4 oz (93.169 kg)  05/12/16 204 lb 8 oz (92.761 kg)  05/07/16 205 lb 3.2 oz (93.078 kg)  BP 132/65 mmHg  Pulse 71  Temp(Src) 97.5 F (36.4 C) (Oral)  Resp 18  Ht 6\' 4"  (1.93 m)  Wt 205 lb 6.4 oz (93.169 kg)  BMI 25.01 kg/m2  SpO2 100%   PHYSICAL EXAMINATION: height is 6\' 4"  (1.93 m) and weight is 205 lb 6.4 oz (93.169 kg). His oral temperature is 97.5 F (36.4 C). His blood pressure is 132/65 and his pulse is 71. His respiration is 18 and oxygen saturation is 100%.        ASSESSMENT: The patient is doing satisfactorily with treatment.  PLAN: We will continue with the patient's radiation treatment as  planned.

## 2016-05-17 ENCOUNTER — Ambulatory Visit
Admission: RE | Admit: 2016-05-17 | Discharge: 2016-05-17 | Disposition: A | Discharge status: Home / Self Care | Payer: BLUE CROSS/BLUE SHIELD | Source: Ambulatory Visit | Attending: Radiation Oncology | Admitting: Radiation Oncology

## 2016-05-17 DIAGNOSIS — Z510 Encounter for antineoplastic radiation therapy: Secondary | ICD-10-CM | POA: Diagnosis not present

## 2016-05-19 ENCOUNTER — Other Ambulatory Visit (HOSPITAL_BASED_OUTPATIENT_CLINIC_OR_DEPARTMENT_OTHER): Payer: BLUE CROSS/BLUE SHIELD

## 2016-05-19 ENCOUNTER — Ambulatory Visit
Admission: RE | Admit: 2016-05-19 | Discharge: 2016-05-19 | Disposition: A | Discharge status: Home / Self Care | Payer: BLUE CROSS/BLUE SHIELD | Source: Ambulatory Visit | Attending: Radiation Oncology | Admitting: Radiation Oncology

## 2016-05-19 DIAGNOSIS — C20 Malignant neoplasm of rectum: Secondary | ICD-10-CM | POA: Diagnosis not present

## 2016-05-19 DIAGNOSIS — D509 Iron deficiency anemia, unspecified: Secondary | ICD-10-CM

## 2016-05-19 DIAGNOSIS — Z510 Encounter for antineoplastic radiation therapy: Secondary | ICD-10-CM | POA: Diagnosis not present

## 2016-05-19 LAB — FERRITIN: FERRITIN: 37 ng/mL (ref 22–316)

## 2016-05-19 LAB — COMPREHENSIVE METABOLIC PANEL
ALBUMIN: 3.7 g/dL (ref 3.5–5.0)
ALK PHOS: 69 U/L (ref 40–150)
ALT: 23 U/L (ref 0–55)
AST: 22 U/L (ref 5–34)
Anion Gap: 10 mEq/L (ref 3–11)
BILIRUBIN TOTAL: 0.89 mg/dL (ref 0.20–1.20)
BUN: 14.1 mg/dL (ref 7.0–26.0)
CALCIUM: 9.4 mg/dL (ref 8.4–10.4)
CO2: 24 mEq/L (ref 22–29)
Chloride: 106 mEq/L (ref 98–109)
Creatinine: 1.2 mg/dL (ref 0.7–1.3)
EGFR: 62 mL/min/{1.73_m2} — ABNORMAL LOW (ref >90)
GLUCOSE: 124 mg/dL (ref 70–140)
Potassium: 4.1 mEq/L (ref 3.5–5.1)
SODIUM: 140 meq/L (ref 136–145)
TOTAL PROTEIN: 6.8 g/dL (ref 6.4–8.3)

## 2016-05-19 LAB — CBC WITH DIFFERENTIAL/PLATELET
BASO%: 0.3 % (ref 0.0–2.0)
Basophils Absolute: 0 10*3/uL (ref 0.0–0.1)
EOS ABS: 0.2 10*3/uL (ref 0.0–0.5)
EOS%: 4.9 % (ref 0.0–7.0)
HEMATOCRIT: 35.3 % — AB (ref 38.4–49.9)
HEMOGLOBIN: 12.6 g/dL — AB (ref 13.0–17.1)
LYMPH%: 11 % — ABNORMAL LOW (ref 14.0–49.0)
MCH: 29.9 pg (ref 27.2–33.4)
MCHC: 35.7 g/dL (ref 32.0–36.0)
MCV: 83.8 fL (ref 79.3–98.0)
MONO#: 0.4 10*3/uL (ref 0.1–0.9)
MONO%: 10.8 % (ref 0.0–14.0)
NEUT%: 73 % (ref 39.0–75.0)
NEUTROS ABS: 2.9 10*3/uL (ref 1.5–6.5)
Platelets: 137 10*3/uL — ABNORMAL LOW (ref 140–400)
RBC: 4.21 10*6/uL (ref 4.20–5.82)
RDW: 19.5 % — AB (ref 11.0–14.6)
WBC: 3.9 10*3/uL — AB (ref 4.0–10.3)
lymph#: 0.4 10*3/uL — ABNORMAL LOW (ref 0.9–3.3)

## 2016-05-20 ENCOUNTER — Ambulatory Visit
Admission: RE | Admit: 2016-05-20 | Discharge: 2016-05-20 | Disposition: A | Discharge status: Home / Self Care | Payer: BLUE CROSS/BLUE SHIELD | Source: Ambulatory Visit | Attending: Radiation Oncology | Admitting: Radiation Oncology

## 2016-05-20 DIAGNOSIS — Z510 Encounter for antineoplastic radiation therapy: Secondary | ICD-10-CM | POA: Diagnosis not present

## 2016-05-21 ENCOUNTER — Ambulatory Visit
Admission: RE | Admit: 2016-05-21 | Discharge: 2016-05-21 | Disposition: A | Discharge status: Home / Self Care | Payer: BLUE CROSS/BLUE SHIELD | Source: Ambulatory Visit | Attending: Radiation Oncology | Admitting: Radiation Oncology

## 2016-05-21 ENCOUNTER — Telehealth: Payer: Self-pay | Admitting: *Deleted

## 2016-05-21 ENCOUNTER — Encounter: Payer: Self-pay | Admitting: Radiation Oncology

## 2016-05-21 VITALS — BP 137/69 | HR 71 | Temp 98.2°F | Resp 20 | Wt 207.7 lb

## 2016-05-21 DIAGNOSIS — C20 Malignant neoplasm of rectum: Secondary | ICD-10-CM

## 2016-05-21 DIAGNOSIS — Z510 Encounter for antineoplastic radiation therapy: Secondary | ICD-10-CM | POA: Diagnosis not present

## 2016-05-21 NOTE — Progress Notes (Signed)
  Radiation Oncology         [phone] ________________________________  Name: Jermaine Smith MRN: [id number]  Date: 05/11/2016  DOB: 11/29/1950  COMPLEX SIMULATION  NOTE  Diagnosis: rectal cancer  Narrative The patient has initially been planned to receive a course of radiation treatment to a dose of 45 Gy in 25 fractions at 1.8 gray per fraction. The patient will now receive a boost to the high risk target volume for an additional 5.4 Gy. This will be delivered in 3 fractions at 1.8 Gy per fraction and a cone down boost technique will be utilized. To accomplish this, an additional 4 customized blocks have been designed for this purpose. A complex isodose plan is requested to ensure that the high-risk target region receives the appropriate radiation dose and that the nearby normal structures continue to be appropriately spared. The patient's final total dose therefore will be 50.4 Gy.   ________________________________ ------------------------------------------------  Jodelle Gross, MD, PhD

## 2016-05-21 NOTE — Telephone Encounter (Signed)
Called pt on cell phone and left message re:  Does pt have enough Capecitabine tablets to last through final dose on 7/10 ?  Or pt needs some refills ?  Left message requesting a call back from pt to collaborative nurse.

## 2016-05-21 NOTE — Progress Notes (Signed)
Department of Radiation Oncology  Phone:  [phone] Fax:        [phone]  Weekly Treatment Note    Name: Jermaine Smith Date: 05/21/2016 MRN: [id number] DOB: [birth year]   Diagnosis:     ICD-9-CM ICD-10-CM   1. Rectal cancer (HCC) 154.1 C20      Current dose: 48.6 Gy  Current fraction: 27   MEDICATIONS: Current Outpatient Prescriptions  Medication Sig Dispense Refill  . aspirin 81 MG tablet Take 81 mg by mouth daily.    . capecitabine (XELODA) 150 MG tablet Take 2 tablets (300 mg total) by mouth 2 (two) times daily after a meal. 60 tablet 1  . capecitabine (XELODA) 500 MG tablet Take 3 tablets (1,500 mg total) by mouth 2 (two) times daily after a meal. 90 tablet 1  . dexlansoprazole (DEXILANT) 60 MG capsule Take 60 mg by mouth daily.    Marland Kitchen docusate sodium (COLACE) 100 MG capsule Take 100 mg by mouth daily.    . ferrous sulfate 325 (65 FE) MG tablet Take 325 mg by mouth daily with breakfast.    . Multiple Vitamin (MULTIVITAMIN) tablet Take 1 tablet by mouth as needed.    . ondansetron (ZOFRAN) 8 MG tablet Take 1 tablet (8 mg total) by mouth every 8 (eight) hours as needed for nausea. 30 tablet 2  . prochlorperazine (COMPAZINE) 10 MG tablet Take 1 tablet (10 mg total) by mouth every 6 (six) hours as needed for nausea or vomiting. (Patient not taking: Reported on 04/30/2016) 30 tablet 1  . VITAMIN D, ERGOCALCIFEROL, PO Take 1 capsule by mouth daily.     No current facility-administered medications for this encounter.     ALLERGIES: Review of patient's allergies indicates no known allergies.   LABORATORY DATA:  Lab Results  Component Value Date   WBC 3.9* 05/19/2016   HGB 12.6* 05/19/2016   HCT 35.3* 05/19/2016   MCV 83.8 05/19/2016   PLT 137* 05/19/2016   Lab Results  Component Value Date   NA 140 05/19/2016   K 4.1 05/19/2016   CL 105 03/19/2016   CO2 24 05/19/2016   Lab Results  Component Value Date   ALT 23 05/19/2016   AST 22 05/19/2016   ALKPHOS 69  05/19/2016   BILITOT 0.89 05/19/2016     NARRATIVE: Jermaine Smith was seen today for weekly treatment management. The chart was checked and the patient's films were reviewed.  Weekly rad txs rectum 27/28 completed. No significant changes from last week. Taking colace and xeloda. Reports the colace helps with pain. Bowels still painful but resolves within a few minutes. Using baby wipes. He has no major complaints at this time.   Wt Readings from Last 3 Encounters:  05/21/16 207 lb 11.2 oz (94.212 kg)  05/14/16 205 lb 6.4 oz (93.169 kg)  05/12/16 204 lb 8 oz (92.761 kg)  BP 137/69 mmHg  Pulse 71  Temp(Src) 98.2 F (36.8 C) (Oral)  Resp 20  Wt 207 lb 11.2 oz (94.212 kg)   PHYSICAL EXAMINATION: weight is 207 lb 11.2 oz (94.212 kg). His oral temperature is 98.2 F (36.8 C). His blood pressure is 137/69 and his pulse is 71. His respiration is 20.      Alert. In no acute distress.   ASSESSMENT: The patient is doing satisfactorily with treatment.  PLAN: We will continue with the patient's radiation treatment as planned. The patient will complete treatment next week. He will return for follow up in 1 month.     ------------------------------------------------  Jodelle Gross, MD, PhD  This document serves as a record of services personally performed by Kyung Rudd, MD. It was created on his behalf by Arlyce Harman, a trained medical scribe. The creation of this record is based on the scribe's personal observations and the provider's statements to them. This document has been checked and approved by the attending provider.

## 2016-05-21 NOTE — Progress Notes (Signed)
Weekly rad txs rectum 27/28 completed,   No significant changes from last week, taking colace, xeloda, bowels still painful but resolves within a few minutes, using baby wipes, 10:49 AM BP 137/69 mmHg  Pulse 71  Temp(Src) 98.2 F (36.8 C) (Oral)  Resp 20  Wt 207 lb 11.2 oz (94.212 kg)  Wt Readings from Last 3 Encounters:  05/21/16 207 lb 11.2 oz (94.212 kg)  05/14/16 205 lb 6.4 oz (93.169 kg)  05/12/16 204 lb 8 oz (92.761 kg)

## 2016-05-24 ENCOUNTER — Encounter: Payer: Self-pay | Admitting: Radiation Oncology

## 2016-05-24 ENCOUNTER — Ambulatory Visit
Admission: RE | Admit: 2016-05-24 | Discharge: 2016-05-24 | Disposition: A | Discharge status: Home / Self Care | Payer: BLUE CROSS/BLUE SHIELD | Source: Ambulatory Visit | Attending: Radiation Oncology | Admitting: Radiation Oncology

## 2016-05-24 ENCOUNTER — Telehealth: Payer: Self-pay | Admitting: Hematology

## 2016-05-24 ENCOUNTER — Encounter: Payer: Self-pay | Admitting: Hematology

## 2016-05-24 ENCOUNTER — Ambulatory Visit (HOSPITAL_BASED_OUTPATIENT_CLINIC_OR_DEPARTMENT_OTHER): Payer: BLUE CROSS/BLUE SHIELD | Admitting: Hematology

## 2016-05-24 VITALS — BP 121/81 | HR 72 | Temp 97.7°F | Resp 18 | Ht 76.0 in | Wt 203.6 lb

## 2016-05-24 DIAGNOSIS — Z510 Encounter for antineoplastic radiation therapy: Secondary | ICD-10-CM | POA: Diagnosis not present

## 2016-05-24 DIAGNOSIS — K5900 Constipation, unspecified: Secondary | ICD-10-CM | POA: Diagnosis not present

## 2016-05-24 DIAGNOSIS — D509 Iron deficiency anemia, unspecified: Secondary | ICD-10-CM | POA: Diagnosis not present

## 2016-05-24 DIAGNOSIS — C20 Malignant neoplasm of rectum: Secondary | ICD-10-CM | POA: Diagnosis not present

## 2016-05-24 NOTE — Progress Notes (Signed)
[facility]  [phone] [phone]  Clinic Follow Up Note   Patient Care Team: Maury Dus, MD as PCP - General (Family Medicine) Ladene Artist, MD as Consulting Physician (Gastroenterology) Leighton Ruff, MD as Consulting Physician (General Surgery) Truitt Merle, MD as Consulting Physician (Hematology) Kyung Rudd, MD as Consulting Physician (Radiation Oncology) 05/24/2016   CHIEF COMPLAINTS:  Follow up proximal rectal cancer  HISTORY OF PRESENTING ILLNESS (04/02/2016):  Jermaine Smith 65 y.o. male is here because of his recently diagnosed rectal adenocarcinoma. He is accompanied by his wife to our multidisciplinary GI clinic today.  He has been having rectal bleeding for the past 4 months, intermittent, small amount, mixed with stool, BM is regular, but smaller caliber, no rectal pain, nausea, bloating, he has lost 5 lbs during his recent colonoscopy procedures, his appetite is normal, has been eating less, since the diagnosis of cancer. His appetite and energy level are normal. He saw gastroenterologist Dr. Fuller Plan in the past for his GERD, and self-referred back to Dr. Fuller Plan and underwent colonoscopy in early May 2017. The colonoscopy showed a polyp in sigmoid colon, and a fungating mass in the proximal rectum, biopsy showed adenocarcinoma. He underwent EUS Colonoscopy, which showed a T3 N1 disease. CT scan was negative for distant metastasis.  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 He has been very healthy, only takes PPI for acid reflux and baby aspirin. Exercise regularly. He feels well well overall.  CURRENT THERAPY: concurrent chemoRT with Xeloda 188m q12h, Monday through Friday, started on 04/14/2016  INTERIM HISTORY: Jermaine Smith for follow up. Today is his last day of chemotherapy and radiation. He has been tolerating treatment well, does feel slightly more fatigued since last week, and has some mild  rectal discomfort. No significant pain, diarrhea, nausea or other symptoms. He is slightly constipated, takes stool softener. He has decent appetite, still works full-time from home. Weight is stable overall.   MEDICAL HISTORY:  Past Medical History  Diagnosis Date  . Left inguinal hernia   . Hiatal hernia   . GERD (gastroesophageal reflux disease)   . FHx: brain aneurysm   . Skin cancer     hx basal cell carcinoma    SURGICAL HISTORY: Past Surgical History  Procedure Laterality Date  . Inguinal hernia repair  1961    open right inguinal age 65y/o  . Lap bilateral ing. hernia repair Bilateral 05/09/12  . Eus N/A 03/25/2016    Procedure: LOWER ENDOSCOPIC ULTRASOUND (EUS);  Surgeon: DMilus Banister MD;  [facility];  Service: Endoscopy;  Laterality: N/A;  . Clavicle surgery      SOCIAL HISTORY: Social History   Social History  . Marital Status: Married    Spouse Name: N/A  . Number of Children: 2  . Years of Education: N/A   Occupational History  . endoscope specialist     Works on [employer] . Smoking status: Former Smoker -- 0.50 packs/day for 5 years    Types: Cigarettes  . Smokeless tobacco: Never Used  . Alcohol Use: 0.0 oz/week    0 Standard drinks or equivalent per week     Comment: ocassional, once a month   . Drug Use: No     Comment: still smoking 1ppd,had quit in 1978,   . Sexual Activity: Not on file   Other Topics Concern  . 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   FAMILY HISTORY: Family History  Problem Relation Age of Onset  . Heart disease Father   . Clotting disorder Mother     PE  . Hypotension Mother   . Colon cancer Neg Hx                              He has two adopted children, 1 and 37  yo  ALLERGIES:  has No Known Allergies.  MEDICATIONS:  Current Outpatient Prescriptions  Medication Sig Dispense Refill  . aspirin 81 MG tablet Take 81 mg by mouth daily.    . capecitabine (XELODA) 150 MG tablet Take 2 tablets (300 mg total) by mouth 2 (two) times daily after a meal. 60 tablet 1  . capecitabine (XELODA) 500 MG tablet Take 3 tablets (1,500 mg total) by mouth 2 (two) times daily after a meal. 90 tablet 1  . dexlansoprazole (DEXILANT) 60 MG capsule Take 60 mg by mouth daily.    Marland Kitchen docusate sodium (COLACE) 100 MG capsule Take 100 mg by mouth daily.    . ferrous sulfate 325 (65 FE) MG tablet Take 325 mg by mouth daily with breakfast.    . Multiple Vitamin (MULTIVITAMIN) tablet Take 1 tablet by mouth as needed.    . ondansetron (ZOFRAN) 8 MG tablet Take 1 tablet (8 mg total) by mouth every 8 (eight) hours as needed for nausea. 30 tablet 2  . prochlorperazine (COMPAZINE) 10 MG tablet Take 1 tablet (10 mg total) by mouth every 6 (six) hours as needed for nausea or vomiting. 30 tablet 1  . VITAMIN D, ERGOCALCIFEROL, PO Take 1 capsule by mouth daily.     No current facility-administered medications for this visit.    REVIEW OF SYSTEMS:   Constitutional: Denies fevers, chills or abnormal night sweats Eyes: Denies blurriness of vision, double vision or watery eyes Ears, nose, mouth, throat, and face: Denies mucositis or sore throat Respiratory: Denies cough, dyspnea or wheezes Cardiovascular: Denies palpitation, chest discomfort or lower extremity swelling Gastrointestinal:  Denies nausea, heartburn or change in bowel habits Skin: Denies abnormal skin rashes Lymphatics: Denies new lymphadenopathy or easy bruising Neurological:Denies numbness, tingling or new weaknesses Behavioral/Psych: Mood is stable, no new changes  All other systems were reviewed with the patient and are negative.  PHYSICAL EXAMINATION: ECOG PERFORMANCE STATUS: 1  Filed Vitals:   05/24/16 1014  BP:  121/81  Pulse: 72  Temp: 97.7 F (36.5 C)  Resp: 18   Filed Weights   05/24/16 1014  Weight: 203 lb 9.6 oz (92.352 kg)    GENERAL:alert, no distress and comfortable SKIN: skin color, texture, turgor are normal, no rashes or significant lesions except some scatter macular rash at the upper inner thighs.  EYES: normal, conjunctiva are pink and non-injected, sclera clear OROPHARYNX:no exudate, no erythema and lips, buccal mucosa, and tongue normal  NECK: supple, thyroid normal size, non-tender, without nodularity LYMPH:  no palpable lymphadenopathy in the cervical, axillary or inguinal LUNGS: clear to auscultation and percussion with normal breathing effort HEART: regular rate & rhythm and no murmurs and no lower extremity edema ABDOMEN:abdomen soft, non-tender and normal bowel sounds, No organomegaly. Rectal exam was not performed. Musculoskeletal:no cyanosis of digits and no clubbing  PSYCH: alert & oriented x 3 with fluent speech NEURO: no focal motor/sensory deficits  LABORATORY DATA:  I have reviewed the data as listed CBC Latest Ref Rng 05/19/2016 05/12/2016 05/05/2016  WBC 4.0 - 10.3 10e3/uL 3.9(L) 3.5(L) 4.3  Hemoglobin 13.0 - 17.1 g/dL 12.6(L) 12.4(L) 12.3(L)  Hematocrit 38.4 - 49.9 % 35.3(L) 35.8(L) 35.9(L)  Platelets 140 - 400 10e3/uL 137(L) 147 150    CMP Latest Ref Rng 05/19/2016 05/12/2016 05/05/2016  Glucose 70 - 140 mg/dl 124 101 105  BUN 7.0 - 26.0 mg/dL 14.1 12.2 14.3  Creatinine 0.7 - 1.3 mg/dL 1.2 1.1 1.2  Sodium 136 - 145 mEq/L 140 139 139  Potassium 3.5 - 5.1 mEq/L 4.1 4.1 4.1  CO2 22 - 29 mEq/L _0 Calcium 8.4 - 10.4 mg/dL 9.4 9.4 9.3  Total Protein 6.4 - 8.3 g/dL 6.8 7.0 6.9  Total Bilirubin 0.20 - 1.20 mg/dL 0.89 0.69 0.82  Alkaline Phos 40 - 150 U/L 69 82 67  AST 5 - 34 U/L _1 ALT 0 - 55 U/L _2 Results for OCTAVIUS, SHIN (MRN [id number]) as of 05/24/2016 20:08  Ref. Range 03/19/2016 15:38 04/15/2016 13:50 05/19/2016 09:49  Iron Latest Ref  Range: 42-163 ug/dL 44 255 (H)   UIBC Latest Ref Range: 117-376 ug/dL  56 (L)   TIBC Latest Ref Range: 202-409 ug/dL  311   %SAT Latest Ref Range: 20-55 %  82 (H)   Saturation Ratios Latest Ref Range: 20.0-50.0 % 10.4 (L)    Ferritin Latest Ref Range: 22-316 ng/ml 7.0 (L) 17 (L) 37   CEA (ng/ml) 03/19/2016: 1.1 04/15/2016: 1.9   Diagnosis 03/19/2016 1. Colon, polyp(s), sigmoid - TUBULAR ADENOMA(S). - HIGH GRADE DYSPLASIA IS NOT IDENTIFIED. 2. Rectum, biopsy, mass - INVASIVE ADENOCARCINOMA.  RADIOGRAPHIC STUDIES: I have personally reviewed the radiological images as listed and agreed with the findings in the report. No results found.   EUS by Dr. Ardis Hughs 03/25/2016 Endosonographic Finding 1. The mass above correlated with a hyoechoic mass that clearly invades into and through the muscularis propria layer of the rectal wall (uT3). 2. The was one small (8m) suspicious perirectal lymphnode that was suspicious for malignant involvement (uN1) 3. The right obturator lymphnode that was described on recent CT scan was not visible on this exam 5cm long, non-circumferential uT3N1 (stage IIIb) proximal rectal adenocarcinoma with distal edge located 9-10cm from the anal verge. Following EUS staging, the mass was labeled with submucosal injection of SPOT.   COLONOSCOPY by Dr. SFuller Plan5/03/2016 Findings:  The digital rectal exam was normal. - A fungating partially obstructing mass was found in the proximal rectum. The mass was partially circumferential (involving two-thirds of the lumen circumference). The mass measured six cm in length by 4 cm in width. It extended from 10-16 cm from the anal verge. Oozing was present. This was biopsied with a cold forceps for histology. - A 10 mm polyp was found in the sigmoid colon. The polyp was semi-pedunculated. The polyp was removed with a hot snare. Resection and retrieval were complete. - Internal hemorrhoids were found during retroflexion. The hemorrhoids  were small and Grade I (internal hemorrhoids that do not prolapse). - The exam was otherwise normal throughout the examined colon.  IMPRESSION: - Malignant partially obstructing tumor in the proximal rectum. Biopsied. - One 10 mm polyp in the sigmoid colon, removed with a hot snare. Resected and retrieved. - Internal hemorrhoids.   ASSESSMENT & PLAN: 65year old occasion  male, presented with mild intermittent rectal bleeding for 4 months.  1. Proximal rectal adenocarcinoma, uT3N1M0, stag IIIB, MMR unknown  -I discussed his CT scan finding, colonoscopy and biopsy results in great details with patient and his wife. -Staging results was discussed with him also, the risk of cancer recurrence for stage III rectal cancer is high, the standard care is neoadjuvant chemotherapy and radiation, followed by surgery and adjuvant chemotherapy. This was discussed with him in great details -I recommend oral capecitabine 825 mg/m twice daily with concurrent radiation. The alternative option of intravenous 5-FU continuous infusion were discussed with him also. He prefers capecitabine due to the convenience. -he has been tolerating concurrent chemo RT well, will complete treatment today  -He takes Zofran twice daily to prevent nausea, will stop in 2-3 days  -I encouraged him to use MiraLAX for his constipation. -He is scheduled to see Dr. Marcello Moores in a few weeks, and have surgery in 6-8 weeks   2. Iron deficient anemia from rectal bleeding. -He had mild anemia with hemoglobin 11.9 when he was diagnosed with rectal cancer. -His iron study showed low ferritin  and transferrin saturation, consistent with iron deficiency. This is from his rectal bleeding. -He has started ferrous sulfate twice daily, I encouraged him to take the pill with orange juice or vitamin C after meal, he is tolerating well  -his repeated ferritin has normalized  -Mild anemia is overall stable  3. Constipation -He takes Colace, I  encouraged him to use MiraLAX as needed for constipation   Plan -complete  concurrent chemoRT today  -Return to clinic in 1 month for follow-up  All questions were answered. The patient knows to call the clinic with any problems, questions or concerns. I spent 10 minutes counseling the patient face to face. The total time spent in the appointment was 15 minutes and more than 50% was on counseling.    Truitt Merle, MD 05/24/2016

## 2016-05-24 NOTE — Telephone Encounter (Signed)
Gave patient avs report and appointments for August. On 8/15 patient will go to lab first - then radonc - then Dr. Burr Medico.

## 2016-06-01 ENCOUNTER — Other Ambulatory Visit: Payer: Self-pay | Admitting: General Surgery

## 2016-06-01 NOTE — H&P (Signed)
History of Present Illness Leighton Ruff MD; [id number] 12:31 PM) The patient is a 65 year old male who presents with colorectal cancer. 65 year old male who underwent colonoscopy due to several episodes of hematochezia. He was found to have a proximal rectal cancer proven by biopsies. CT scan of the chest abdomen and pelvis show no signs of metastatic disease. There was one enlarged perirectal lymph nodes noted. There were some small pulmonary nodules and granulomas. CEA level was normal. The tumor was thought to be approximately 10 cm from anal verge at its lowest margin. It was tattooed distally. Ultrasound shows a T3 N1 tumor. He is scheduled to see radiation oncology and oncology later this week to discuss neoadjuvant therapy.   Problem List/Past Medical Leighton Ruff, MD; [id number] 12:32 PM) RECTAL CANCER (C20)  Other Problems Leighton Ruff, MD; [id number] 12:32 PM) Inguinal Hernia Gastroesophageal Reflux Disease  Past Surgical History Leighton Ruff, MD; [id number] 12:32 PM) Colon Polyp Removal - Colonoscopy Laparoscopic Inguinal Hernia Surgery Bilateral. multiple  Diagnostic Studies History Leighton Ruff, MD; [id number] 12:32 PM) Colonoscopy within last year  Allergies Elbert Ewings, CMA; 06/01/2016 9:44 AM) No Known Drug Allergies 03/30/2016  Medication History Leighton Ruff, MD; [id number] 12:32 PM) NexIUM (40MG  Capsule DR, Oral) Active. Multiple Vitamin (Oral) Active. Valtrex (500MG  Tablet, Oral) Active. Aspirin (81MG  Tablet, Oral) Active. Medications Reconciled Neomycin Sulfate (500MG  Tablet, 2 (two) Tablet Oral SEE NOTE, Taken starting 06/01/2016) Active. (TAKE TWO TABLETS AT 2 PM, 3 PM, AND 10 PM THE DAY PRIOR TO SURGERY) Flagyl (500MG  Tablet, 2 (two) Tablet Oral SEE NOTE, Taken starting 06/01/2016) Active. (Take at 2pm, 3pm, and 10pm the day prior to your colon operation)  Social History Leighton Ruff, MD; [id number] 12:32 PM) Caffeine use  Carbonated beverages, Coffee. Alcohol use Occasional alcohol use. Tobacco use Former smoker. No drug use  Family History Leighton Ruff, MD; [id number] 12:32 PM) Heart Disease Father.    Vitals Elbert Ewings CMA; 06/01/2016 9:45 AM) 06/01/2016 9:44 AM Weight: 206.8 lb Height: 76in Body Surface Area: 2.25 m Body Mass Index: 25.17 kg/m  Temp.: 97.29F(Temporal)  Pulse: 69 (Regular)  BP: 124/82 (Sitting, Left Arm, Standard)      Physical Exam Leighton Ruff MD; [id number] 12:32 PM)  General Mental Status-Alert. General Appearance-Not in acute distress. Build & Nutrition-Well nourished. Posture-Normal posture. Gait-Normal.  Head and Neck Head-normocephalic, atraumatic with no lesions or palpable masses. Trachea-midline.  Chest and Lung Exam Chest and lung exam reveals -on auscultation, normal breath sounds, no adventitious sounds and normal vocal resonance.  Cardiovascular Cardiovascular examination reveals -normal heart sounds, regular rate and rhythm with no murmurs.  Abdomen Inspection Inspection of the abdomen reveals - No Hernias. Palpation/Percussion Palpation and Percussion of the abdomen reveal - Soft, Non Tender, No Rigidity (guarding), No hepatosplenomegaly and No Palpable abdominal masses.  Neurologic Neurologic evaluation reveals -alert and oriented x 3 with no impairment of recent or remote memory, normal attention span and ability to concentrate, normal sensation and normal coordination.  Musculoskeletal Normal Exam - Bilateral-Upper Extremity Strength Normal and Lower Extremity Strength Normal.    Assessment & Plan Leighton Ruff MD; [id number] 10:28 AM)  RECTAL CANCER (C20) Impression: 65 year old male with a proximal rectal cancer status post chemotherapy and radiation for stage III tumor. He has now finished his treatment and is ready to discuss surgery. The surgery and anatomy were described to the patient as  well as the risks of surgery and the possible complications. These include: Bleeding, deep abdominal infections and possible wound complications such as  hernia and infection, damage to adjacent structures, leak of surgical connections, which can lead to other surgeries and possibly an ostomy, possible need for other procedures, such as abscess drains in radiology, possible prolonged hospital stay, possible diarrhea from removal of part of the colon, possible constipation from narcotics, possible bowel, bladder or sexual dysfunction if having rectal surgery, prolonged fatigue/weakness or appetite loss, possible early recurrence of of disease, possible complications of their medical problems such as heart disease or arrhythmias or lung problems, death (less than 1%). I believe the patient understands and wishes to proceed with the surgery.

## 2016-06-06 NOTE — Progress Notes (Signed)
  Radiation Oncology         [phone]) [phone] ________________________________  Name: Jermaine Smith MRN: [id number]  Date: 05/24/2016  DOB: 16-Sep-1951  End of Treatment Note  Diagnosis:   Rectal cancer    Rectal cancer [facility])   Staging form: Colon and Rectum, AJCC 7th Edition   - Clinical stage from 03/19/2016: Stage IIIB (T3, N1, M0) - Signed by Truitt Merle, MD on 04/02/2016  Indication for treatment:  Curative       Radiation treatment dates:   04/14/2016 through 05/24/2016  Site/dose:    The patient was treated to the pelvis to a dose of 45 Gy at 1.8 Gy per fraction. This was accomplished using a 4 field 3-D conformal technique. The patient then received a boost to the tumor and adjacent high-risk regions for an additional 5.4 Gy at 1.8 gray per fraction. This was carried out using a coned-down 4 field approach. The patient's total dose was 50.4 Gy. Daily AlignRT was used on a daily basis to insure proper patient positioning and localization of critical targets/ structures. The patient received concurrent chemotherapy during the course of radiation treatment.  Narrative: The patient tolerated radiation treatment relatively well.   Minimal GI toxicity was seen during treatment  Plan: The patient has completed radiation treatment. The patient will return to radiation oncology clinic for routine followup in one month. I advised the patient to call or return sooner if they have any questions or concerns related to their recovery or treatment.   ------------------------------------------------  Jodelle Gross, MD, PhD

## 2016-06-29 ENCOUNTER — Other Ambulatory Visit (HOSPITAL_BASED_OUTPATIENT_CLINIC_OR_DEPARTMENT_OTHER): Payer: BLUE CROSS/BLUE SHIELD

## 2016-06-29 ENCOUNTER — Encounter: Payer: Self-pay | Admitting: Hematology

## 2016-06-29 ENCOUNTER — Encounter: Payer: Self-pay | Admitting: Radiation Oncology

## 2016-06-29 ENCOUNTER — Ambulatory Visit (HOSPITAL_BASED_OUTPATIENT_CLINIC_OR_DEPARTMENT_OTHER): Payer: BLUE CROSS/BLUE SHIELD | Admitting: Hematology

## 2016-06-29 ENCOUNTER — Ambulatory Visit
Admission: RE | Admit: 2016-06-29 | Discharge: 2016-06-29 | Disposition: A | Discharge status: Home / Self Care | Payer: BLUE CROSS/BLUE SHIELD | Source: Ambulatory Visit | Attending: Radiation Oncology | Admitting: Radiation Oncology

## 2016-06-29 VITALS — BP 136/70 | HR 62 | Temp 98.4°F | Resp 18 | Ht 76.0 in | Wt 207.7 lb

## 2016-06-29 VITALS — BP 149/74 | HR 65 | Temp 97.7°F | Resp 18 | Ht 76.0 in | Wt 208.9 lb

## 2016-06-29 DIAGNOSIS — K5900 Constipation, unspecified: Secondary | ICD-10-CM | POA: Diagnosis not present

## 2016-06-29 DIAGNOSIS — D509 Iron deficiency anemia, unspecified: Secondary | ICD-10-CM

## 2016-06-29 DIAGNOSIS — C20 Malignant neoplasm of rectum: Secondary | ICD-10-CM | POA: Diagnosis not present

## 2016-06-29 LAB — CBC WITH DIFFERENTIAL/PLATELET
BASO%: 0.9 % (ref 0.0–2.0)
BASOS ABS: 0 10*3/uL (ref 0.0–0.1)
EOS%: 5.4 % (ref 0.0–7.0)
Eosinophils Absolute: 0.2 10*3/uL (ref 0.0–0.5)
HCT: 38.3 % — ABNORMAL LOW (ref 38.4–49.9)
HGB: 13.2 g/dL (ref 13.0–17.1)
LYMPH#: 0.5 10*3/uL — AB (ref 0.9–3.3)
LYMPH%: 14.1 % (ref 14.0–49.0)
MCH: 30.4 pg (ref 27.2–33.4)
MCHC: 34.5 g/dL (ref 32.0–36.0)
MCV: 88.1 fL (ref 79.3–98.0)
MONO#: 0.6 10*3/uL (ref 0.1–0.9)
MONO%: 15 % — AB (ref 0.0–14.0)
NEUT#: 2.5 10*3/uL (ref 1.5–6.5)
NEUT%: 64.6 % (ref 39.0–75.0)
Platelets: 161 10*3/uL (ref 140–400)
RBC: 4.34 10*6/uL (ref 4.20–5.82)
RDW: 19.5 % — AB (ref 11.0–14.6)
WBC: 3.9 10*3/uL — AB (ref 4.0–10.3)

## 2016-06-29 LAB — COMPREHENSIVE METABOLIC PANEL
ALBUMIN: 3.8 g/dL (ref 3.5–5.0)
ALK PHOS: 70 U/L (ref 40–150)
ALT: 17 U/L (ref 0–55)
AST: 20 U/L (ref 5–34)
Anion Gap: 7 mEq/L (ref 3–11)
BUN: 15.4 mg/dL (ref 7.0–26.0)
CHLORIDE: 108 meq/L (ref 98–109)
CO2: 25 meq/L (ref 22–29)
Calcium: 9.3 mg/dL (ref 8.4–10.4)
Creatinine: 1.1 mg/dL (ref 0.7–1.3)
EGFR: 68 mL/min/{1.73_m2} — AB (ref >90)
GLUCOSE: 112 mg/dL (ref 70–140)
POTASSIUM: 4.1 meq/L (ref 3.5–5.1)
SODIUM: 140 meq/L (ref 136–145)
Total Bilirubin: 1.08 mg/dL (ref 0.20–1.20)
Total Protein: 6.8 g/dL (ref 6.4–8.3)

## 2016-06-29 LAB — FERRITIN: Ferritin: 33 ng/ml (ref 22–316)

## 2016-06-29 NOTE — Progress Notes (Signed)
[facility]  [phone] [phone]  Clinic Follow Up Note   Patient Care Team: Maury Dus, MD as PCP - General (Family Medicine) Ladene Artist, MD as Consulting Physician (Gastroenterology) Leighton Ruff, MD as Consulting Physician (General Surgery) Truitt Merle, MD as Consulting Physician (Hematology) Kyung Rudd, MD as Consulting Physician (Radiation Oncology) Tania Ade, RN as Registered Nurse 06/29/2016   CHIEF COMPLAINTS:  Follow up proximal rectal cancer  Oncology History   Rectal cancer [facility])   Staging form: Colon and Rectum, AJCC 7th Edition     Clinical stage from 03/19/2016: Stage IIIB (T3, N1, M0) - Signed by Truitt Merle, MD on 04/02/2016       Rectal cancer ([place])   03/19/2016 Initial Diagnosis    Rectal cancer ([place])     03/19/2016 Initial Biopsy    Rectal mass biopsy showed invasive adenocarcinoma. Sigmoid colon polyps showed tubular adenoma.      03/19/2016 Procedure    Colonoscopy showed a fungating partially obstructing mass in the proximal rectum, partially circumferential involving two thirds of the lumen circumference, measuring 6 x 4 cm, 10-16 cm from the anal verge. A 10 mm polyps was found in the sigmoid colon.     03/22/2016 Tumor Marker    CEA=1.1     03/23/2016 Imaging    CT chest, abdomen and pelvis with contrast showed asymmetric rectal mass approximately 10 cm from the anus, 2 suspicious lymph nodes in the deep pelvis concerning for local nodal metastasis. No other evidence of metastatic disease.     03/25/2016 Procedure    EUS showed a T3 proximal rectal mass, there was one small 6 mm suspicious for rectal node that was suspicious for malignancy (uN1)     04/14/2016 - 05/24/2016 Chemotherapy    Xeloda 1800 mg every 12 hours, Monday through Friday, with concurrent radiation     04/14/2016 - 05/24/2016 Radiation Therapy     Adjuvant irradiation to his rectal cancer.      HISTORY OF PRESENTING ILLNESS (04/02/2016):    Jermaine Smith 65 y.o. male is here because of his recently diagnosed rectal adenocarcinoma. He is accompanied by his wife to our multidisciplinary GI clinic today.  He has been having rectal bleeding for the past 4 months, intermittent, small amount, mixed with stool, BM is regular, but smaller caliber, no rectal pain, nausea, bloating, he has lost 5 lbs during his recent colonoscopy procedures, his appetite is normal, has been eating less, since the diagnosis of cancer. His appetite and energy level are normal. He saw gastroenterologist Dr. Fuller Plan in the past for his GERD, and self-referred back to Dr. Fuller Plan and underwent colonoscopy in early May 2017. The colonoscopy showed a polyp in sigmoid colon, and a fungating mass in the proximal rectum, biopsy showed adenocarcinoma. He underwent EUS Colonoscopy, which showed a T3 N1 disease. CT scan was negative for distant metastasis.  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 He has been very healthy, only takes PPI for acid reflux and baby aspirin. Exercise regularly. He feels well well overall.  CURRENT THERAPY: pending surgery   INTERIM HISTORY: Mr. Popowski returns for follow up. He has recovered well from chemotherapy and radiation. He still has mild rectal discomfort with bowel movements, but no significant pain, overall much improved compared to one months ago. He has good appetite and energy level, back to work full-time. No other complaints. He is scheduled to have surgery on  September 20.  MEDICAL HISTORY:  Past Medical History:  Diagnosis Date  . FHx: brain aneurysm   . GERD (gastroesophageal reflux disease)   . Hiatal hernia   . Left inguinal hernia   . Skin cancer    hx basal cell carcinoma    SURGICAL HISTORY: Past Surgical History:  Procedure Laterality Date  . CLAVICLE SURGERY    . EUS N/A 03/25/2016   Procedure: LOWER ENDOSCOPIC ULTRASOUND (EUS);  Surgeon: Milus Banister, MD;  Location: [facility];  Service: Endoscopy;  Laterality: N/A;  . INGUINAL HERNIA REPAIR  1961   open right inguinal age 68 y/o  . lap bilateral ing. hernia repair Bilateral 05/09/12    SOCIAL HISTORY: Social History   Social History  . Marital status: Married    Spouse name: N/A  . Number of children: 2  . Years of education: N/A   Occupational History  . endoscope specialist [employer]    Works on [employer]  . Smoking status: Former Smoker    Packs/day: 0.50    Years: 5.00    Types: Cigarettes  . Smokeless tobacco: Never Used  . Alcohol use 0.0 oz/week     Comment: ocassional, once a month   . Drug use: No     Comment: still smoking 1ppd,had quit in 1978,   . Sexual activity: Not on file   Other Topics Concern  . 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   FAMILY HISTORY: Family History  Problem Relation Age of Onset  . Heart disease Father   . Clotting disorder Mother     PE  . Hypotension Mother   . Colon cancer Neg Hx                              He has two adopted children, 110 and 68 yo  ALLERGIES:  has No Known Allergies.  MEDICATIONS:  Current Outpatient Prescriptions  Medication Sig Dispense Refill  . aspirin 81 MG tablet Take 81 mg by mouth daily.    Marland Kitchen dexlansoprazole (DEXILANT) 60 MG capsule Take 60 mg by mouth daily.    Marland Kitchen docusate sodium (COLACE) 100 MG capsule Take 100 mg by mouth daily.    . ferrous sulfate 325 (65 FE) MG tablet Take 325 mg by mouth daily with breakfast.    . Multiple Vitamin (MULTIVITAMIN) tablet Take 1 tablet by mouth as needed.    Marland Kitchen VITAMIN D, ERGOCALCIFEROL, PO Take 1 capsule by mouth daily.    . capecitabine (XELODA) 150 MG tablet Take 2 tablets (300 mg total) by mouth 2 (two) times daily after a meal. (Patient  not taking: Reported on 06/29/2016) 60 tablet 1  . capecitabine (XELODA) 500 MG tablet Take 3 tablets (1,500 mg total) by mouth 2 (two) times daily after a meal. (Patient not taking: Reported on 06/29/2016) 90 tablet 1  . ondansetron (ZOFRAN) 8 MG tablet Take 1 tablet (8 mg total) by mouth every 8 (eight) hours as needed for nausea. (Patient not taking: Reported on 06/29/2016) 30 tablet 2  . prochlorperazine (COMPAZINE) 10 MG tablet Take 1 tablet (10 mg total) by mouth every 6 (six) hours as needed for nausea or vomiting. (Patient not taking: Reported  on 06/29/2016) 30 tablet 1   No current facility-administered medications for this visit.     REVIEW OF SYSTEMS:   Constitutional: Denies fevers, chills or abnormal night sweats Eyes: Denies blurriness of vision, double vision or watery eyes Ears, nose, mouth, throat, and face: Denies mucositis or sore throat Respiratory: Denies cough, dyspnea or wheezes Cardiovascular: Denies palpitation, chest discomfort or lower extremity swelling Gastrointestinal:  Denies nausea, heartburn or change in bowel habits Skin: Denies abnormal skin rashes Lymphatics: Denies new lymphadenopathy or easy bruising Neurological:Denies numbness, tingling or new weaknesses Behavioral/Psych: Mood is stable, no new changes  All other systems were reviewed with the patient and are negative.  PHYSICAL EXAMINATION: ECOG PERFORMANCE STATUS: 0  Vitals:   06/29/16 1400  BP: 136/70  Pulse: 62  Resp: 18  Temp: 98.4 F (36.9 C)   Filed Weights   06/29/16 1400  Weight: 207 lb 11.2 oz (94.2 kg)    GENERAL:alert, no distress and comfortable SKIN: skin color, texture, turgor are normal, no rashes or significant lesions except some scatter macular rash at the upper inner thighs.  EYES: normal, conjunctiva are pink and non-injected, sclera clear OROPHARYNX:no exudate, no erythema and lips, buccal mucosa, and tongue normal  NECK: supple, thyroid normal size, non-tender,  without nodularity LYMPH:  no palpable lymphadenopathy in the cervical, axillary or inguinal LUNGS: clear to auscultation and percussion with normal breathing effort HEART: regular rate & rhythm and no murmurs and no lower extremity edema ABDOMEN:abdomen soft, non-tender and normal bowel sounds, No organomegaly. Rectal exam was not performed. Musculoskeletal:no cyanosis of digits and no clubbing  PSYCH: alert & oriented x 3 with fluent speech NEURO: no focal motor/sensory deficits  LABORATORY DATA:  I have reviewed the data as listed CBC Latest Ref Rng & Units 06/29/2016 05/19/2016 05/12/2016  WBC 4.0 - 10.3 10e3/uL 3.9(L) 3.9(L) 3.5(L)  Hemoglobin 13.0 - 17.1 g/dL 13.2 12.6(L) 12.4(L)  Hematocrit 38.4 - 49.9 % 38.3(L) 35.3(L) 35.8(L)  Platelets 140 - 400 10e3/uL 161 137(L) 147    CMP Latest Ref Rng & Units 06/29/2016 05/19/2016 05/12/2016  Glucose 70 - 140 mg/dl 112 124 101  BUN 7.0 - 26.0 mg/dL 15.4 14.1 12.2  Creatinine 0.7 - 1.3 mg/dL 1.1 1.2 1.1  Sodium 136 - 145 mEq/L 140 140 139  Potassium 3.5 - 5.1 mEq/L 4.1 4.1 4.1  Chloride 96 - 112 mEq/L - - -  CO2 22 - 29 mEq/L _0 Calcium 8.4 - 10.4 mg/dL 9.3 9.4 9.4  Total Protein 6.4 - 8.3 g/dL 6.8 6.8 7.0  Total Bilirubin 0.20 - 1.20 mg/dL 1.08 0.89 0.69  Alkaline Phos 40 - 150 U/L 70 69 82  AST 5 - 34 U/L _1 ALT 0 - 55 U/L _2 CEA (ng/ml) 03/19/2016: 1.1 04/15/2016: 1.9   Diagnosis 03/19/2016 1. Colon, polyp(s), sigmoid - TUBULAR ADENOMA(S). - HIGH GRADE DYSPLASIA IS NOT IDENTIFIED. 2. Rectum, biopsy, mass - INVASIVE ADENOCARCINOMA.  RADIOGRAPHIC STUDIES: I have personally reviewed the radiological images as listed and agreed with the findings in the report. No results found.   EUS by Dr. Ardis Hughs 03/25/2016 Endosonographic Finding 1. The mass above correlated with a hyoechoic mass that clearly invades into and through the muscularis propria layer of the rectal wall (uT3). 2. The was one small (58m)  suspicious perirectal lymphnode that was suspicious for malignant involvement (uN1) 3. The right obturator lymphnode that was described on recent CT scan was not visible on  this exam 5cm long, non-circumferential uT3N1 (stage IIIb) proximal rectal adenocarcinoma with distal edge located 9-10cm from the anal verge. Following EUS staging, the mass was labeled with submucosal injection of SPOT.   COLONOSCOPY by Dr. Fuller Plan 03/19/2016 Findings:  The digital rectal exam was normal. - A fungating partially obstructing mass was found in the proximal rectum. The mass was partially circumferential (involving two-thirds of the lumen circumference). The mass measured six cm in length by 4 cm in width. It extended from 10-16 cm from the anal verge. Oozing was present. This was biopsied with a cold forceps for histology. - A 10 mm polyp was found in the sigmoid colon. The polyp was semi-pedunculated. The polyp was removed with a hot snare. Resection and retrieval were complete. - Internal hemorrhoids were found during retroflexion. The hemorrhoids were small and Grade I (internal hemorrhoids that do not prolapse). - The exam was otherwise normal throughout the examined colon.  IMPRESSION: - Malignant partially obstructing tumor in the proximal rectum. Biopsied. - One 10 mm polyp in the sigmoid colon, removed with a hot snare. Resected and retrieved. - Internal hemorrhoids.   ASSESSMENT & PLAN: 65 year old occasion male, presented with mild intermittent rectal bleeding for 4 months.  1. Proximal rectal adenocarcinoma, uT3N1M0, stag IIIB, MMR unknown  -I discussed his CT scan finding, colonoscopy and biopsy results in great details with patient and his wife. -Staging results was discussed with him also, the risk of cancer recurrence for stage III rectal cancer is high, the standard care is neoadjuvant chemotherapy and radiation, followed by surgery and adjuvant chemotherapy. This was discussed with him in  great details -He has completed neoadjuvant chemoradiation with Xeloda, tolerated well overall. -He is scheduled to have a partial colectomy and anastomosis on September 20 -We discussed the role of adjuvant chemotherapy after surgery. It depends on his final surgical past, we'll decide if he would have single agent Xeloda versus combined chemotherapy (FOLFOX or CAPOX)  2. Iron deficient anemia from rectal bleeding. -He had mild anemia with hemoglobin 11.9 when he was diagnosed with rectal cancer. -His iron study showed low ferritin  and transferrin saturation, consistent with iron deficiency. This is from his rectal bleeding. -He has started ferrous sulfate twice daily, I encouraged him to take the pill with orange juice or vitamin C after meal, he is tolerating well. He is on once daily -I repeated his ferritin level today, results are still pending -Mild anemia has resolved  3. Constipation -He takes Colace, I encouraged him to use MiraLAX as needed for constipation   Plan -Surgery on September 20 -I'll see him back on 10/12 OR 10/13   All questions were answered. The patient knows to call the clinic with any problems, questions or concerns. I spent 10 minutes counseling the patient face to face. The total time spent in the appointment was 15 minutes and more than 50% was on counseling.    Truitt Merle, MD 06/29/2016

## 2016-06-29 NOTE — Addendum Note (Signed)
Encounter addended by: Malena Edman, RN on: 06/29/2016  3:49 PM<BR>    Actions taken: Charge Capture section accepted

## 2016-06-29 NOTE — Progress Notes (Signed)
Jermaine Smith is here for a one month follow up visit for rectal cancer.   Rectal irritation is much improved since radiation completion.  Having constipation taking colace every other day.  Appetite is good.  Denies pain.  Fatigue is much improved. Wt Readings from Last 3 Encounters:  06/29/16 208 lb 14.4 oz (94.8 kg)  05/24/16 203 lb 9.6 oz (92.4 kg)  05/21/16 207 lb 11.2 oz (94.2 kg)  BP (!) 149/74 (BP Location: Right Arm, Patient Position: Sitting, Cuff Size: Normal)   Pulse 65   Temp 97.7 F (36.5 C) (Oral)   Resp 18   Ht 6\' 4"  (1.93 m)   Wt 208 lb 14.4 oz (94.8 kg)   SpO2 100%   BMI 25.43 kg/m

## 2016-06-29 NOTE — Progress Notes (Signed)
Radiation Oncology         [phone]) [phone] ________________________________  Name: Jermaine Smith MRN: [id number]  Date: 06/29/2016  DOB: 1951/05/07  Follow-Up Visit Note  CC: Vena Austria, MD  Maury Dus, MD  Diagnosis:   Stage IIIB, T3 N1 M0 adenocarcinoma of the rectum.  Interval Since Last Radiation:  1 month  04/14/2016 through 05/24/2016: The patient was treated to the pelvis to a dose of 45 Gy at 1.8 Gy per fraction. This was accomplished using a 4 field 3-D conformal technique. The patient then received a boost to the tumor and adjacent high-risk regions for an additional 5.4 Gy at 1.8 gray per fraction. This was carried out using a coned-down 4 field approach. The patient's total dose was 50.4 Gy  Narrative:  The patient returns today for routine follow-up.he has plans to undergo surgical intervention with Dr. Marcello Moores in September for his rectal cancer and is seen Dr. Burr Medico today. He tolerated radiotherapy very well. He did experience some rectal irritation and at times use Proctofoam for this. He is now having constipation and taking Colace every other day. He denies any rectal bleeding. No other complaints or verbalized.                            ALLERGIES:  has No Known Allergies.  Meds: Current Outpatient Prescriptions  Medication Sig Dispense Refill  . aspirin 81 MG tablet Take 81 mg by mouth daily.    Marland Kitchen dexlansoprazole (DEXILANT) 60 MG capsule Take 60 mg by mouth daily.    Marland Kitchen docusate sodium (COLACE) 100 MG capsule Take 100 mg by mouth daily.    . ferrous sulfate 325 (65 FE) MG tablet Take 325 mg by mouth daily with breakfast.    . Multiple Vitamin (MULTIVITAMIN) tablet Take 1 tablet by mouth as needed.    Marland Kitchen VITAMIN D, ERGOCALCIFEROL, PO Take 1 capsule by mouth daily.    . capecitabine (XELODA) 150 MG tablet Take 2 tablets (300 mg total) by mouth 2 (two) times daily after a meal. (Patient not taking: Reported on 06/29/2016) 60 tablet 1  . capecitabine (XELODA) 500 MG  tablet Take 3 tablets (1,500 mg total) by mouth 2 (two) times daily after a meal. (Patient not taking: Reported on 06/29/2016) 90 tablet 1  . ondansetron (ZOFRAN) 8 MG tablet Take 1 tablet (8 mg total) by mouth every 8 (eight) hours as needed for nausea. (Patient not taking: Reported on 06/29/2016) 30 tablet 2  . prochlorperazine (COMPAZINE) 10 MG tablet Take 1 tablet (10 mg total) by mouth every 6 (six) hours as needed for nausea or vomiting. (Patient not taking: Reported on 06/29/2016) 30 tablet 1   No current facility-administered medications for this encounter.     Physical Findings:  height is 6\' 4"  (1.93 m) and weight is 208 lb 14.4 oz (94.8 kg). His oral temperature is 97.7 F (36.5 C). His blood pressure is 149/74 (abnormal) and his pulse is 65. His respiration is 18 and oxygen saturation is 100%.  In general this is a well appearing Caucasian male in no acute distress. He's alert and oriented x4 and appropriate throughout the examination. Cardiopulmonary assessment is negative for acute distress and he exhibits normal effort.    Lab Findings: Lab Results  Component Value Date   WBC 3.9 (L) 06/29/2016   HGB 13.2 06/29/2016   HCT 38.3 (L) 06/29/2016   MCV 88.1 06/29/2016   PLT 161 06/29/2016  Radiographic Findings: No results found.  Impression/Plan: 1. Stage IIIB, T3 N1 M0 adenocarcinoma of the rectum. The patienthas recovered well from the effects of radiotherapy, and is scheduled to undergo robotic assisted LAR with Dr. Marcello Moores on 08/04/2016. We will plan to see him back in 6 months time for continued evaluation.     Carola Rhine, PAC

## 2016-07-02 ENCOUNTER — Telehealth: Payer: Self-pay | Admitting: Hematology

## 2016-07-02 NOTE — Telephone Encounter (Signed)
Called patient to confirm appointment. L/M appt letter and schedule mailed.

## 2016-07-26 ENCOUNTER — Encounter (HOSPITAL_COMMUNITY)
Admission: RE | Admit: 2016-07-26 | Discharge: 2016-07-26 | Disposition: A | Discharge status: Home / Self Care | Payer: BLUE CROSS/BLUE SHIELD | Source: Ambulatory Visit | Attending: General Surgery | Admitting: General Surgery

## 2016-07-26 ENCOUNTER — Encounter (HOSPITAL_COMMUNITY): Payer: Self-pay

## 2016-07-26 DIAGNOSIS — Z01812 Encounter for preprocedural laboratory examination: Secondary | ICD-10-CM | POA: Diagnosis not present

## 2016-07-26 DIAGNOSIS — C20 Malignant neoplasm of rectum: Secondary | ICD-10-CM | POA: Diagnosis not present

## 2016-07-26 LAB — BASIC METABOLIC PANEL
ANION GAP: 5 (ref 5–15)
BUN: 15 mg/dL (ref 6–20)
CALCIUM: 9.4 mg/dL (ref 8.9–10.3)
CO2: 28 mmol/L (ref 22–32)
CREATININE: 1.1 mg/dL (ref 0.61–1.24)
Chloride: 106 mmol/L (ref 101–111)
GFR calc Af Amer: >60 mL/min (ref >60)
GLUCOSE: 98 mg/dL (ref 65–99)
Potassium: 4.7 mmol/L (ref 3.5–5.1)
Sodium: 139 mmol/L (ref 135–145)

## 2016-07-26 LAB — ABO/RH: ABO/RH(D): A NEG

## 2016-07-26 LAB — CBC
HCT: 39 % (ref 39.0–52.0)
Hemoglobin: 13.6 g/dL (ref 13.0–17.0)
MCH: 31.4 pg (ref 26.0–34.0)
MCHC: 34.9 g/dL (ref 30.0–36.0)
MCV: 90.1 fL (ref 78.0–100.0)
PLATELETS: 161 10*3/uL (ref 150–400)
RBC: 4.33 MIL/uL (ref 4.22–5.81)
RDW: 13.8 % (ref 11.5–15.5)
WBC: 4.3 10*3/uL (ref 4.0–10.5)

## 2016-07-26 NOTE — Patient Instructions (Addendum)
Jermaine Smith  07/26/2016   Your procedure is scheduled on: Wednesday 08/04/2016  Report to [facility] Main  Entrance take [place]  elevators to 3rd floor to  [place] at  [place] AM.  Call this number if you have problems the morning of surgery [phone]   Remember: ONLY 1 PERSON MAY GO WITH YOU TO SHORT STAY TO GET  READY MORNING OF [place].            FOLLOW BOWEL PREP INSTRUCTIONS FROM DR. THOMAS OFFICE WITH A CLEAR LIQUID DIET ALL DAY  THE DAY BEFORE SURGERY UP TO MIDNIGHT!   Do not eat food or drink liquids :After Midnight.     Take these medicines the morning of surgery with A SIP OF WATER: dexilant                                 You may not have any metal on your body including hair pins and              piercings  Do not wear jewelry, make-up, lotions, powders or perfumes, deodorant             Do not wear nail polish.  Do not shave  48 hours prior to surgery.              Men may shave face and neck.   Do not bring valuables to the hospital. [place].  Contacts, dentures or bridgework may not be worn into surgery.  Leave suitcase in the car. After surgery it may be brought to your room.                  Please read over the following fact sheets you were given: _____________________________________________________________________             [facility] - Preparing for Surgery Before surgery, you can play an important role.  Because skin is not sterile, your skin needs to be as free of germs as possible.  You can reduce the number of germs on your skin by washing with CHG (chlorahexidine gluconate) soap before surgery.  CHG is an antiseptic cleaner which kills germs and bonds with the skin to continue killing germs even after washing. Please DO NOT use if you have an allergy to CHG or antibacterial soaps.  If your skin becomes reddened/irritated stop using the CHG and inform your nurse  when you arrive at Short Stay. Do not shave (including legs and underarms) for at least 48 hours prior to the first CHG shower.  You may shave your face/neck. Please follow these instructions carefully:  1.  Shower with CHG Soap the night before surgery and the  morning of Surgery.  2.  If you choose to wash your hair, wash your hair first as usual with your  normal  shampoo.  3.  After you shampoo, rinse your hair and body thoroughly to remove the  shampoo.                           4.  Use CHG as you would any other liquid soap.  You can apply chg directly  to the skin and  wash                       Gently with a scrungie or clean washcloth.  5.  Apply the CHG Soap to your body ONLY FROM THE NECK DOWN.   Do not use on face/ open                           Wound or open sores. Avoid contact with eyes, ears mouth and genitals (private parts).                       Wash face,  Genitals (private parts) with your normal soap.             6.  Wash thoroughly, paying special attention to the area where your surgery  will be performed.  7.  Thoroughly rinse your body with warm water from the neck down.  8.  DO NOT shower/wash with your normal soap after using and rinsing off  the CHG Soap.                9.  Pat yourself dry with a clean towel.            10.  Wear clean pajamas.            11.  Place clean sheets on your bed the night of your first shower and do not  sleep with pets. Day of Surgery : Do not apply any lotions/deodorants the morning of surgery.  Please wear clean clothes to the hospital/surgery center.  FAILURE TO FOLLOW THESE INSTRUCTIONS MAY RESULT IN THE CANCELLATION OF YOUR SURGERY PATIENT SIGNATURE_________________________________  NURSE SIGNATURE__________________________________  ________________________________________________________________________   Jermaine Smith  An incentive spirometer is a tool that can help keep your lungs clear and active. This tool  measures how well you are filling your lungs with each breath. Taking long deep breaths may help reverse or decrease the chance of developing breathing (pulmonary) problems (especially infection) following:  A long period of time when you are unable to move or be active. BEFORE THE PROCEDURE   If the spirometer includes an indicator to show your best effort, your nurse or respiratory therapist will set it to a desired goal.  If possible, sit up straight or lean slightly forward. Try not to slouch.  Hold the incentive spirometer in an upright position. INSTRUCTIONS FOR USE  1. Sit on the edge of your bed if possible, or sit up as far as you can in bed or on a chair. 2. Hold the incentive spirometer in an upright position. 3. Breathe out normally. 4. Place the mouthpiece in your mouth and seal your lips tightly around it. 5. Breathe in slowly and as deeply as possible, raising the piston or the ball toward the top of the column. 6. Hold your breath for 3-5 seconds or for as long as possible. Allow the piston or ball to fall to the bottom of the column. 7. Remove the mouthpiece from your mouth and breathe out normally. 8. Rest for a few seconds and repeat Steps 1 through 7 at least 10 times every 1-2 hours when you are awake. Take your time and take a few normal breaths between deep breaths. 9. The spirometer may include an indicator to show your best effort. Use the indicator as a goal to work toward during each repetition. 10. After each set of 10 deep  breaths, practice coughing to be sure your lungs are clear. If you have an incision (the cut made at the time of surgery), support your incision when coughing by placing a pillow or rolled up towels firmly against it. Once you are able to get out of bed, walk around indoors and cough well. You may stop using the incentive spirometer when instructed by your caregiver.  RISKS AND COMPLICATIONS  Take your time so you do not get dizzy or  light-headed.  If you are in pain, you may need to take or ask for pain medication before doing incentive spirometry. It is harder to take a deep breath if you are having pain. AFTER USE  Rest and breathe slowly and easily.  It can be helpful to keep track of a log of your progress. Your caregiver can provide you with a simple table to help with this. If you are using the spirometer at home, follow these instructions: [place] IF:   You are having difficultly using the spirometer.  You have trouble using the spirometer as often as instructed.  Your pain medication is not giving enough relief while using the spirometer.  You develop fever of 100.5 F (38.1 C) or higher. SEEK IMMEDIATE MEDICAL CARE IF:   You cough up bloody sputum that had not been present before.  You develop fever of 102 F (38.9 C) or greater.  You develop worsening pain at or near the incision site. MAKE SURE YOU:   Understand these instructions.  Will watch your condition.  Will get help right away if you are not doing well or get worse. Document Released: 03/14/2007 Document Revised: 01/24/2012 Document Reviewed: 05/15/2007 ExitCare Patient Information 2014 ExitCare, [place].   ________________________________________________________________________  WHAT IS A BLOOD TRANSFUSION? Blood Transfusion Information  A transfusion is the replacement of blood or some of its parts. Blood is made up of multiple cells which provide different functions.  Red blood cells carry oxygen and are used for blood loss replacement.  White blood cells fight against infection.  Platelets control bleeding.  Plasma helps clot blood.  Other blood products are available for specialized needs, such as hemophilia or other clotting disorders. BEFORE THE TRANSFUSION  Who gives blood for transfusions?   Healthy volunteers who are fully evaluated to make sure their blood is safe. This is blood bank  blood. Transfusion therapy is the safest it has ever been in the practice of medicine. Before blood is taken from a donor, a complete history is taken to make sure that person has no history of diseases nor engages in risky social behavior (examples are intravenous drug use or sexual activity with multiple partners). The donor's travel history is screened to minimize risk of transmitting infections, such as malaria. The donated blood is tested for signs of infectious diseases, such as HIV and hepatitis. The blood is then tested to be sure it is compatible with you in order to minimize the chance of a transfusion reaction. If you or a relative donates blood, this is often done in anticipation of surgery and is not appropriate for emergency situations. It takes many days to process the donated blood. RISKS AND COMPLICATIONS Although transfusion therapy is very safe and saves many lives, the main dangers of transfusion include:   Getting an infectious disease.  Developing a transfusion reaction. This is an allergic reaction to something in the blood you were given. Every precaution is taken to prevent this. The decision to have a blood transfusion has  been considered carefully by your caregiver before blood is given. Blood is not given unless the benefits outweigh the risks. AFTER THE TRANSFUSION  Right after receiving a blood transfusion, you will usually feel much better and more energetic. This is especially true if your red blood cells have gotten low (anemic). The transfusion raises the level of the red blood cells which carry oxygen, and this usually causes an energy increase.  The nurse administering the transfusion will monitor you carefully for complications. HOME CARE INSTRUCTIONS  No special instructions are needed after a transfusion. You may find your energy is better. Speak with your caregiver about any limitations on activity for underlying diseases you may have. SEEK MEDICAL CARE IF:    Your condition is not improving after your transfusion.  You develop redness or irritation at the intravenous (IV) site. SEEK IMMEDIATE MEDICAL CARE IF:  Any of the following symptoms occur over the next 12 hours:  Shaking chills.  You have a temperature by mouth above 102 F (38.9 C), not controlled by medicine.  Chest, back, or muscle pain.  People around you feel you are not acting correctly or are confused.  Shortness of breath or difficulty breathing.  Dizziness and fainting.  You get a rash or develop hives.  You have a decrease in urine output.  Your urine turns a dark color or changes to pink, red, or brown. Any of the following symptoms occur over the next 10 days:  You have a temperature by mouth above 102 F (38.9 C), not controlled by medicine.  Shortness of breath.  Weakness after normal activity.  The white part of the eye turns yellow (jaundice).  You have a decrease in the amount of urine or are urinating less often.  Your urine turns a dark color or changes to pink, red, or brown. Document Released: 10/29/2000 Document Revised: 01/24/2012 Document Reviewed: 06/17/2008 [facility] Patient Information 2014 [place].    CLEAR LIQUID DIET   Foods Allowed                                                                     Foods Excluded  Coffee and tea, regular and decaf                             liquids that you cannot  Plain Jell-O in any flavor                                             see through such as: Fruit ices (not with fruit pulp)                                     milk, soups, orange juice  Iced Popsicles                                    All solid food Carbonated beverages, regular and diet  Cranberry, grape and apple juices Sports drinks like Gatorade Lightly seasoned clear broth or consume(fat free) Sugar, honey syrup  Sample Menu Breakfast                                Lunch                                      Supper Cranberry juice                    Beef broth                            Chicken broth Jell-O                                     Grape juice                           Apple juice Coffee or tea                        Jell-O                                      Popsicle                                                Coffee or tea                        Coffee or tea  _____________________________________________________________________   _______________________________________________________________________

## 2016-07-27 LAB — HEMOGLOBIN A1C
Hgb A1c MFr Bld: 5.1 % (ref 4.8–5.6)
MEAN PLASMA GLUCOSE: 100 mg/dL

## 2016-08-03 NOTE — Anesthesia Preprocedure Evaluation (Addendum)
Anesthesia Evaluation  Patient identified by MRN, date of birth, ID band Patient awake    Reviewed: Allergy & Precautions, NPO status , Patient's Chart, lab work & pertinent test results  Airway Mallampati: I  TM Distance: >3 FB Neck ROM: Full    Dental no notable dental hx. (+) Teeth Intact   Pulmonary neg pulmonary ROS, former smoker,    Pulmonary exam normal        Cardiovascular negative cardio ROS Normal cardiovascular exam     Neuro/Psych negative neurological ROS  negative psych ROS   GI/Hepatic negative GI ROS, Neg liver ROS, hiatal hernia, GERD  Medicated,  Endo/Other  negative endocrine ROS  Renal/GU negative Renal ROS  negative genitourinary   Musculoskeletal negative musculoskeletal ROS (+)   Abdominal   Peds negative pediatric ROS (+)  Hematology negative hematology ROS (+)   Anesthesia Other Findings   Reproductive/Obstetrics negative OB ROS                            Anesthesia Physical Anesthesia Plan  ASA: II  Anesthesia Plan: General   Post-op Pain Management:    Induction: Intravenous  Airway Management Planned: Oral ETT  Additional Equipment:   Intra-op Plan:   Post-operative Plan: Extubation in OR  Informed Consent: I have reviewed the patients History and Physical, chart, labs and discussed the procedure including the risks, benefits and alternatives for the proposed anesthesia with the patient or authorized representative who has indicated his/her understanding and acceptance.   Dental advisory given  Plan Discussed with: CRNA  Anesthesia Plan Comments:         Anesthesia Quick Evaluation

## 2016-08-04 ENCOUNTER — Inpatient Hospital Stay (HOSPITAL_COMMUNITY): Payer: BLUE CROSS/BLUE SHIELD | Admitting: Anesthesiology

## 2016-08-04 ENCOUNTER — Inpatient Hospital Stay (HOSPITAL_COMMUNITY)
Admission: RE | Admit: 2016-08-04 | Discharge: 2016-08-07 | DRG: 331 | Disposition: A | Discharge status: Home / Self Care | Payer: BLUE CROSS/BLUE SHIELD | Source: Ambulatory Visit | Attending: General Surgery | Admitting: General Surgery

## 2016-08-04 ENCOUNTER — Encounter (HOSPITAL_COMMUNITY): Payer: Self-pay | Admitting: *Deleted

## 2016-08-04 ENCOUNTER — Encounter (HOSPITAL_COMMUNITY)
Admission: RE | Disposition: A | Discharge status: Home / Self Care | Payer: Self-pay | Source: Ambulatory Visit | Attending: General Surgery

## 2016-08-04 DIAGNOSIS — Z7982 Long term (current) use of aspirin: Secondary | ICD-10-CM

## 2016-08-04 DIAGNOSIS — Z79899 Other long term (current) drug therapy: Secondary | ICD-10-CM

## 2016-08-04 DIAGNOSIS — Z923 Personal history of irradiation: Secondary | ICD-10-CM

## 2016-08-04 DIAGNOSIS — K219 Gastro-esophageal reflux disease without esophagitis: Secondary | ICD-10-CM | POA: Diagnosis present

## 2016-08-04 DIAGNOSIS — Z86010 Personal history of colonic polyps: Secondary | ICD-10-CM

## 2016-08-04 DIAGNOSIS — Z87891 Personal history of nicotine dependence: Secondary | ICD-10-CM | POA: Diagnosis not present

## 2016-08-04 DIAGNOSIS — Z9221 Personal history of antineoplastic chemotherapy: Secondary | ICD-10-CM | POA: Diagnosis not present

## 2016-08-04 DIAGNOSIS — C20 Malignant neoplasm of rectum: Principal | ICD-10-CM | POA: Diagnosis present

## 2016-08-04 DIAGNOSIS — K4091 Unilateral inguinal hernia, without obstruction or gangrene, recurrent: Secondary | ICD-10-CM | POA: Diagnosis present

## 2016-08-04 DIAGNOSIS — Z85828 Personal history of other malignant neoplasm of skin: Secondary | ICD-10-CM

## 2016-08-04 HISTORY — PX: XI ROBOTIC ASSISTED LOWER ANTERIOR RESECTION: SHX6558

## 2016-08-04 LAB — TYPE AND SCREEN
ABO/RH(D): A NEG
ANTIBODY SCREEN: NEGATIVE

## 2016-08-04 SURGERY — RESECTION, RECTUM, LOW ANTERIOR, ROBOT-ASSISTED
Anesthesia: General | Site: Abdomen

## 2016-08-04 MED ORDER — FENTANYL CITRATE (PF) 100 MCG/2ML IJ SOLN
INTRAMUSCULAR | Status: DC | PRN
  Administered 2016-08-04: 50 ug via INTRAVENOUS
  Administered 2016-08-04: 100 ug via INTRAVENOUS
  Administered 2016-08-04 (×8): 50 ug via INTRAVENOUS

## 2016-08-04 MED ORDER — DEXAMETHASONE SODIUM PHOSPHATE 10 MG/ML IJ SOLN
INTRAMUSCULAR | Status: AC
  Filled 2016-08-04: qty 1

## 2016-08-04 MED ORDER — MORPHINE SULFATE (PF) 2 MG/ML IV SOLN
2.0000 mg | INTRAVENOUS | Status: DC | PRN
  Administered 2016-08-04 – 2016-08-05 (×3): 2 mg via INTRAVENOUS
  Administered 2016-08-06: 4 mg via INTRAVENOUS
  Administered 2016-08-06: 2 mg via INTRAVENOUS
  Filled 2016-08-04 (×2): qty 1
  Filled 2016-08-04: qty 2
  Filled 2016-08-04 (×2): qty 1

## 2016-08-04 MED ORDER — ACETAMINOPHEN 500 MG PO TABS
1000.0000 mg | ORAL_TABLET | Freq: Four times a day (QID) | ORAL | Status: AC
  Administered 2016-08-04 – 2016-08-05 (×4): 1000 mg via ORAL
  Filled 2016-08-04 (×4): qty 2

## 2016-08-04 MED ORDER — ONDANSETRON HCL 4 MG/2ML IJ SOLN
4.0000 mg | Freq: Once | INTRAMUSCULAR | Status: AC
  Administered 2016-08-04: 4 mg via INTRAVENOUS

## 2016-08-04 MED ORDER — DEXAMETHASONE SODIUM PHOSPHATE 10 MG/ML IJ SOLN
INTRAMUSCULAR | Status: DC | PRN
  Administered 2016-08-04: 10 mg via INTRAVENOUS

## 2016-08-04 MED ORDER — DEXTROSE 5 % IV SOLN
2.0000 g | INTRAVENOUS | Status: AC
  Administered 2016-08-04: 2 g via INTRAVENOUS
  Filled 2016-08-04: qty 2

## 2016-08-04 MED ORDER — ALVIMOPAN 12 MG PO CAPS
12.0000 mg | ORAL_CAPSULE | Freq: Once | ORAL | Status: AC
  Administered 2016-08-04: 12 mg via ORAL
  Filled 2016-08-04: qty 1

## 2016-08-04 MED ORDER — FENTANYL CITRATE (PF) 250 MCG/5ML IJ SOLN
INTRAMUSCULAR | Status: AC
  Filled 2016-08-04: qty 5

## 2016-08-04 MED ORDER — ORAL CARE MOUTH RINSE
15.0000 mL | Freq: Two times a day (BID) | OROMUCOSAL | Status: DC
  Administered 2016-08-04 – 2016-08-07 (×4): 15 mL via OROMUCOSAL

## 2016-08-04 MED ORDER — CELECOXIB 200 MG PO CAPS
400.0000 mg | ORAL_CAPSULE | ORAL | Status: AC
  Administered 2016-08-04: 400 mg via ORAL
  Filled 2016-08-04: qty 2

## 2016-08-04 MED ORDER — DIPHENHYDRAMINE HCL 50 MG/ML IJ SOLN: 12.5000 mg | Freq: Four times a day (QID) | INTRAMUSCULAR | Status: DC | PRN

## 2016-08-04 MED ORDER — HYDROMORPHONE HCL 1 MG/ML IJ SOLN
0.2500 mg | INTRAMUSCULAR | Status: DC | PRN
  Administered 2016-08-04 (×2): 0.25 mg via INTRAVENOUS

## 2016-08-04 MED ORDER — MIDAZOLAM HCL 5 MG/5ML IJ SOLN
INTRAMUSCULAR | Status: DC | PRN
  Administered 2016-08-04: 2 mg via INTRAVENOUS

## 2016-08-04 MED ORDER — ONDANSETRON HCL 4 MG/2ML IJ SOLN: 4.0000 mg | Freq: Four times a day (QID) | INTRAMUSCULAR | Status: DC | PRN

## 2016-08-04 MED ORDER — DEXTROSE 5 % IV SOLN
2.0000 g | Freq: Two times a day (BID) | INTRAVENOUS | Status: AC
  Administered 2016-08-04: 2 g via INTRAVENOUS
  Filled 2016-08-04: qty 2

## 2016-08-04 MED ORDER — ONDANSETRON HCL 4 MG/2ML IJ SOLN
INTRAMUSCULAR | Status: AC
  Filled 2016-08-04: qty 2

## 2016-08-04 MED ORDER — INFLUENZA VAC SPLIT QUAD 0.5 ML IM SUSY
0.5000 mL | PREFILLED_SYRINGE | INTRAMUSCULAR | Status: AC
  Administered 2016-08-06: 0.5 mL via INTRAMUSCULAR
  Filled 2016-08-04: qty 0.5

## 2016-08-04 MED ORDER — PROPOFOL 10 MG/ML IV BOLUS
INTRAVENOUS | Status: DC | PRN
  Administered 2016-08-04: 180 mg via INTRAVENOUS

## 2016-08-04 MED ORDER — ACETAMINOPHEN 500 MG PO TABS
1000.0000 mg | ORAL_TABLET | ORAL | Status: AC
  Administered 2016-08-04: 1000 mg via ORAL
  Filled 2016-08-04: qty 2

## 2016-08-04 MED ORDER — PANTOPRAZOLE SODIUM 40 MG PO TBEC
40.0000 mg | DELAYED_RELEASE_TABLET | Freq: Every day | ORAL | Status: DC
  Administered 2016-08-05 – 2016-08-07 (×3): 40 mg via ORAL
  Filled 2016-08-04 (×3): qty 1

## 2016-08-04 MED ORDER — PROMETHAZINE HCL 25 MG/ML IJ SOLN
INTRAMUSCULAR | Status: AC
  Filled 2016-08-04: qty 1

## 2016-08-04 MED ORDER — PHENYLEPHRINE 40 MCG/ML (10ML) SYRINGE FOR IV PUSH (FOR BLOOD PRESSURE SUPPORT)
PREFILLED_SYRINGE | INTRAVENOUS | Status: AC
  Filled 2016-08-04: qty 10

## 2016-08-04 MED ORDER — LACTATED RINGERS IV SOLN: INTRAVENOUS | Status: DC

## 2016-08-04 MED ORDER — MIDAZOLAM HCL 2 MG/2ML IJ SOLN
INTRAMUSCULAR | Status: AC
  Filled 2016-08-04: qty 2

## 2016-08-04 MED ORDER — ROCURONIUM BROMIDE 100 MG/10ML IV SOLN
INTRAVENOUS | Status: AC
  Filled 2016-08-04: qty 2

## 2016-08-04 MED ORDER — LIDOCAINE 2% (20 MG/ML) 5 ML SYRINGE
INTRAMUSCULAR | Status: DC | PRN
  Administered 2016-08-04: 50 mg via INTRAVENOUS

## 2016-08-04 MED ORDER — GABAPENTIN 300 MG PO CAPS
300.0000 mg | ORAL_CAPSULE | ORAL | Status: AC
  Administered 2016-08-04: 300 mg via ORAL
  Filled 2016-08-04: qty 1

## 2016-08-04 MED ORDER — 0.9 % SODIUM CHLORIDE (POUR BTL) OPTIME
TOPICAL | Status: DC | PRN
  Administered 2016-08-04: 2000 mL

## 2016-08-04 MED ORDER — ENOXAPARIN SODIUM 40 MG/0.4ML ~~LOC~~ SOLN
40.0000 mg | SUBCUTANEOUS | Status: DC
  Administered 2016-08-05 – 2016-08-07 (×3): 40 mg via SUBCUTANEOUS
  Filled 2016-08-04 (×3): qty 0.4

## 2016-08-04 MED ORDER — HYDROMORPHONE HCL 1 MG/ML IJ SOLN
INTRAMUSCULAR | Status: AC
  Filled 2016-08-04: qty 1

## 2016-08-04 MED ORDER — LACTATED RINGERS IV SOLN
INTRAVENOUS | Status: DC | PRN
  Administered 2016-08-04 (×2): via INTRAVENOUS

## 2016-08-04 MED ORDER — ONDANSETRON HCL 4 MG PO TABS: 4.0000 mg | ORAL_TABLET | Freq: Four times a day (QID) | ORAL | Status: DC | PRN

## 2016-08-04 MED ORDER — DIPHENHYDRAMINE HCL 12.5 MG/5ML PO ELIX: 12.5000 mg | ORAL_SOLUTION | Freq: Four times a day (QID) | ORAL | Status: DC | PRN

## 2016-08-04 MED ORDER — LIDOCAINE 2% (20 MG/ML) 5 ML SYRINGE
INTRAMUSCULAR | Status: AC
  Filled 2016-08-04: qty 5

## 2016-08-04 MED ORDER — FENTANYL CITRATE (PF) 100 MCG/2ML IJ SOLN
INTRAMUSCULAR | Status: AC
  Filled 2016-08-04: qty 2

## 2016-08-04 MED ORDER — KCL IN DEXTROSE-NACL 20-5-0.45 MEQ/L-%-% IV SOLN
INTRAVENOUS | Status: DC
  Administered 2016-08-04 – 2016-08-05 (×2): via INTRAVENOUS
  Filled 2016-08-04 (×4): qty 1000

## 2016-08-04 MED ORDER — CEFOTETAN DISODIUM-DEXTROSE 2-2.08 GM-% IV SOLR
INTRAVENOUS | Status: AC
  Filled 2016-08-04: qty 50

## 2016-08-04 MED ORDER — BUPIVACAINE-EPINEPHRINE 0.5% -1:200000 IJ SOLN
INTRAMUSCULAR | Status: AC
  Filled 2016-08-04: qty 1

## 2016-08-04 MED ORDER — PROPOFOL 10 MG/ML IV BOLUS
INTRAVENOUS | Status: AC
  Filled 2016-08-04: qty 20

## 2016-08-04 MED ORDER — ROCURONIUM BROMIDE 10 MG/ML (PF) SYRINGE
PREFILLED_SYRINGE | INTRAVENOUS | Status: DC | PRN
  Administered 2016-08-04: 20 mg via INTRAVENOUS
  Administered 2016-08-04 (×4): 10 mg via INTRAVENOUS
  Administered 2016-08-04: 50 mg via INTRAVENOUS

## 2016-08-04 MED ORDER — ALVIMOPAN 12 MG PO CAPS
12.0000 mg | ORAL_CAPSULE | Freq: Two times a day (BID) | ORAL | Status: DC
  Administered 2016-08-05: 12 mg via ORAL
  Filled 2016-08-04: qty 1

## 2016-08-04 MED ORDER — PHENYLEPHRINE HCL 10 MG/ML IJ SOLN
INTRAMUSCULAR | Status: DC | PRN
  Administered 2016-08-04: 40 ug via INTRAVENOUS

## 2016-08-04 MED ORDER — HEPARIN SODIUM (PORCINE) 5000 UNIT/ML IJ SOLN
5000.0000 [IU] | Freq: Once | INTRAMUSCULAR | Status: AC
  Administered 2016-08-04: 5000 [IU] via SUBCUTANEOUS
  Filled 2016-08-04: qty 1

## 2016-08-04 MED ORDER — SUGAMMADEX SODIUM 200 MG/2ML IV SOLN
INTRAVENOUS | Status: DC | PRN
  Administered 2016-08-04: 200 mg via INTRAVENOUS

## 2016-08-04 MED ORDER — LACTATED RINGERS IR SOLN
Status: DC | PRN
  Administered 2016-08-04: 1000 mL

## 2016-08-04 MED ORDER — PROMETHAZINE HCL 25 MG/ML IJ SOLN
6.2500 mg | INTRAMUSCULAR | Status: DC | PRN
  Administered 2016-08-04: 12.5 mg via INTRAVENOUS

## 2016-08-04 MED ORDER — BUPIVACAINE-EPINEPHRINE (PF) 0.5% -1:200000 IJ SOLN
INTRAMUSCULAR | Status: DC | PRN
  Administered 2016-08-04: 50 mL

## 2016-08-04 MED ORDER — ONDANSETRON HCL 4 MG/2ML IJ SOLN
INTRAMUSCULAR | Status: DC | PRN
  Administered 2016-08-04: 4 mg via INTRAVENOUS

## 2016-08-04 SURGICAL SUPPLY — 98 items
BLADE EXTENDED COATED 6.5IN (ELECTRODE) IMPLANT
CANNULA REDUC XI 12-8 STAPL (CANNULA) ×1
CANNULA REDUC XI 12-8MM STAPL (CANNULA) ×1
CANNULA REDUCER 12-8 DVNC XI (CANNULA) ×1 IMPLANT
CELLS DAT CNTRL 66122 CELL SVR (MISCELLANEOUS) IMPLANT
CLIP LIGATING HEM O LOK PURPLE (MISCELLANEOUS) IMPLANT
CLIP LIGATING HEMOLOK MED (MISCELLANEOUS) IMPLANT
COUNTER NEEDLE 20 DBL MAG RED (NEEDLE) ×3 IMPLANT
COVER MAYO STAND STRL (DRAPES) ×9 IMPLANT
COVER TIP SHEARS 8 DVNC (MISCELLANEOUS) ×1 IMPLANT
COVER TIP SHEARS 8MM DA VINCI (MISCELLANEOUS) ×2
DECANTER SPIKE VIAL GLASS SM (MISCELLANEOUS) ×3 IMPLANT
DEVICE TROCAR PUNCTURE CLOSURE (ENDOMECHANICALS) IMPLANT
DRAIN CHANNEL 19F RND (DRAIN) ×3 IMPLANT
DRAPE ARM DVNC X/XI (DISPOSABLE) ×4 IMPLANT
DRAPE COLUMN DVNC XI (DISPOSABLE) ×1 IMPLANT
DRAPE DA VINCI XI ARM (DISPOSABLE) ×8
DRAPE DA VINCI XI COLUMN (DISPOSABLE) ×2
DRAPE SURG IRRIG POUCH 19X23 (DRAPES) ×3 IMPLANT
DRSG OPSITE POSTOP 4X10 (GAUZE/BANDAGES/DRESSINGS) IMPLANT
DRSG OPSITE POSTOP 4X6 (GAUZE/BANDAGES/DRESSINGS) ×3 IMPLANT
DRSG OPSITE POSTOP 4X8 (GAUZE/BANDAGES/DRESSINGS) IMPLANT
ELECT PENCIL ROCKER SW 15FT (MISCELLANEOUS) ×6 IMPLANT
ELECT REM PT RETURN 15FT ADLT (MISCELLANEOUS) ×3 IMPLANT
ENDOLOOP SUT PDS II  0 18 (SUTURE)
ENDOLOOP SUT PDS II 0 18 (SUTURE) IMPLANT
EVACUATOR SILICONE 100CC (DRAIN) IMPLANT
GAUZE SPONGE 4X4 12PLY STRL (GAUZE/BANDAGES/DRESSINGS) IMPLANT
GLOVE BIO SURGEON STRL SZ 6.5 (GLOVE) ×6 IMPLANT
GLOVE BIO SURGEONS STRL SZ 6.5 (GLOVE) ×3
GLOVE BIOGEL PI IND STRL 7.0 (GLOVE) ×3 IMPLANT
GLOVE BIOGEL PI INDICATOR 7.0 (GLOVE) ×6
GOWN STRL REUS W/TWL 2XL LVL3 (GOWN DISPOSABLE) ×9 IMPLANT
GOWN STRL REUS W/TWL XL LVL3 (GOWN DISPOSABLE) ×12 IMPLANT
GRASPER ENDOPATH ANVIL 10MM (MISCELLANEOUS) ×3 IMPLANT
HOLDER FOLEY CATH W/STRAP (MISCELLANEOUS) ×3 IMPLANT
IRRIG SUCT STRYKERFLOW 2 WTIP (MISCELLANEOUS) ×3
IRRIGATION SUCT STRKRFLW 2 WTP (MISCELLANEOUS) ×1 IMPLANT
KIT PROCEDURE DA VINCI SI (MISCELLANEOUS) ×2
KIT PROCEDURE DVNC SI (MISCELLANEOUS) ×1 IMPLANT
LEGGING LITHOTOMY PAIR STRL (DRAPES) ×3 IMPLANT
LIQUID BAND (GAUZE/BANDAGES/DRESSINGS) ×3 IMPLANT
NEEDLE INSUFFLATION 14GA 120MM (NEEDLE) ×3 IMPLANT
PACK CARDIOVASCULAR III (CUSTOM PROCEDURE TRAY) ×3 IMPLANT
PACK COLON (CUSTOM PROCEDURE TRAY) ×3 IMPLANT
PORT LAP GEL ALEXIS MED 5-9CM (MISCELLANEOUS) ×3 IMPLANT
RTRCTR WOUND ALEXIS 18CM MED (MISCELLANEOUS)
SCISSORS LAP 5X35 DISP (ENDOMECHANICALS) ×3 IMPLANT
SEAL CANN UNIV 5-8 DVNC XI (MISCELLANEOUS) ×3 IMPLANT
SEAL XI 5MM-8MM UNIVERSAL (MISCELLANEOUS) ×6
SEALER VESSEL DA VINCI XI (MISCELLANEOUS) ×4
SEALER VESSEL EXT DVNC XI (MISCELLANEOUS) ×2 IMPLANT
SET BI-LUMEN FLTR TB AIRSEAL (TUBING) ×3 IMPLANT
SLEEVE ADV FIXATION 5X100MM (TROCAR) IMPLANT
SOLUTION ELECTROLUBE (MISCELLANEOUS) ×3 IMPLANT
SPONGE DRAIN TRACH 4X4 STRL 2S (GAUZE/BANDAGES/DRESSINGS) ×3 IMPLANT
STAPLER 45 BLU RELOAD XI (STAPLE) IMPLANT
STAPLER 45 BLUE RELOAD XI (STAPLE)
STAPLER 45 GREEN RELOAD XI (STAPLE) ×6
STAPLER 45 GRN RELOAD XI (STAPLE) ×3 IMPLANT
STAPLER 45 WHITE RELOAD XI (STAPLE) ×2
STAPLER 45 WHT RELOAD XI (STAPLE) ×1 IMPLANT
STAPLER CANNULA SEAL DVNC XI (STAPLE) ×1 IMPLANT
STAPLER CANNULA SEAL XI (STAPLE) ×2
STAPLER CIRC CVD 29MM 37CM (STAPLE) ×3 IMPLANT
STAPLER SHEATH (SHEATH) ×2
STAPLER SHEATH ENDOWRIST DVNC (SHEATH) ×1 IMPLANT
STAPLER VISISTAT 35W (STAPLE) ×3 IMPLANT
SUT ETHILON 2 0 PS N (SUTURE) ×3 IMPLANT
SUT NOVA NAB GS-21 0 18 T12 DT (SUTURE) ×6 IMPLANT
SUT PDS AB 1 CTX 36 (SUTURE) IMPLANT
SUT PDS AB 1 TP1 96 (SUTURE) IMPLANT
SUT PROLENE 2 0 KS (SUTURE) ×6 IMPLANT
SUT SILK 2 0 (SUTURE) ×2
SUT SILK 2 0 SH CR/8 (SUTURE) ×3 IMPLANT
SUT SILK 2-0 18XBRD TIE 12 (SUTURE) ×1 IMPLANT
SUT SILK 3 0 (SUTURE) ×2
SUT SILK 3 0 SH CR/8 (SUTURE) ×3 IMPLANT
SUT SILK 3-0 18XBRD TIE 12 (SUTURE) ×1 IMPLANT
SUT V-LOC BARB 180 2/0GR6 GS22 (SUTURE)
SUT VIC AB 2-0 SH 18 (SUTURE) ×3 IMPLANT
SUT VIC AB 2-0 SH 27 (SUTURE) ×2
SUT VIC AB 2-0 SH 27X BRD (SUTURE) ×1 IMPLANT
SUT VIC AB 3-0 SH 18 (SUTURE) IMPLANT
SUT VIC AB 4-0 PS2 27 (SUTURE) ×6 IMPLANT
SUT VLOC BARB 180 ABS3/0GR12 (SUTURE) ×3
SUTURE V-LC BRB 180 2/0GR6GS22 (SUTURE) IMPLANT
SUTURE VLOC BRB 180 ABS3/0GR12 (SUTURE) ×1 IMPLANT
SYR BULB IRRIGATION 50ML (SYRINGE) ×3 IMPLANT
SYRINGE 10CC LL (SYRINGE) ×3 IMPLANT
SYS LAPSCP GELPORT 120MM (MISCELLANEOUS)
SYSTEM LAPSCP GELPORT 120MM (MISCELLANEOUS) IMPLANT
TOWEL OR 17X26 10 PK STRL BLUE (TOWEL DISPOSABLE) ×3 IMPLANT
TOWEL OR NON WOVEN STRL DISP B (DISPOSABLE) ×3 IMPLANT
TRAY FOLEY W/METER SILVER 16FR (SET/KITS/TRAYS/PACK) ×3 IMPLANT
TROCAR ADV FIXATION 5X100MM (TROCAR) ×3 IMPLANT
TUBING CONNECTING 10 (TUBING) IMPLANT
TUBING CONNECTING 10' (TUBING)

## 2016-08-04 NOTE — Op Note (Signed)
08/04/2016  1:00 PM  PATIENT:  Jermaine Smith  65 y.o. male  Patient Care Team: Maury Dus, MD as PCP - General (Family Medicine) Ladene Artist, MD as Consulting Physician (Gastroenterology) Leighton Ruff, MD as Consulting Physician (General Surgery) Truitt Merle, MD as Consulting Physician (Hematology) Kyung Rudd, MD as Consulting Physician (Radiation Oncology) Tania Ade, RN as Registered Nurse  PRE-OPERATIVE DIAGNOSIS:  rectal cancer  POST-OPERATIVE DIAGNOSIS:  rectal cancer  PROCEDURE:  ROBOTIC ASSISTED LOWER ANTERIOR RESECTION   SURGEON:  Surgeon(s): Leighton Ruff, MD Michael Boston, MD  ASSISTANT: Dr Johney [place]   ANESTHESIA:   local and general  EBL: 65ml  Total I/O In: 1000 [I.V.:1000] Out: 340 [Urine:200; Drains:90; Blood:50]  Delay start of Pharmacological VTE agent (>24hrs) due to surgical blood loss or risk of bleeding:  no  DRAINS: (73F) Jackson-Pratt drain(s) with closed bulb suction in the pelvis   SPECIMEN:  Source of Specimen:  Recto-sigmoid  DISPOSITION OF SPECIMEN:  PATHOLOGY  COUNTS:  YES  PLAN OF CARE: Admit to inpatient   PATIENT DISPOSITION:  PACU - hemodynamically stable.  INDICATION:    65 y.o. M with rectal cancer.   I recommended low anterior resection:  The anatomy & physiology of the digestive tract was discussed.  The pathophysiology was discussed.  Natural history risks without surgery was discussed.   I worked to give an overview of the disease and the frequent need to have multispecialty involvement.  I feel the risks of no intervention will lead to serious problems that outweigh the operative risks; therefore, I recommended a partial colectomy to remove the pathology.  Laparoscopic & open techniques were discussed.   Risks such as bleeding, infection, abscess, leak, reoperation, possible ostomy, hernia, heart attack, death, and other risks were discussed.  I noted a good likelihood this will help address the problem.   Goals of  post-operative recovery were discussed as well.    The patient expressed understanding & wished to proceed with surgery.  OR FINDINGS:   Patient had tattoo located at ~10cm  No obvious metastatic disease on visceral parietal peritoneum or liver.  The anastomosis rests 7-8 cm from the anal verge by rigid proctoscopy.  DESCRIPTION:   Informed consent was confirmed.  The patient underwent general anaesthesia without difficulty.  The patient was positioned appropriately.  VTE prevention in place.  The patient's abdomen was clipped, prepped, & draped in a sterile fashion.  Surgical timeout confirmed our plan.  The patient was positioned in reverse Trendelenburg.  Abdominal entry was gained using a varies needle in the LUQ.  Entry was clean.  I induced carbon dioxide insufflation. An 39mm port was placed in the RUQ.  Camera inspection revealed no injury.  Extra ports were carefully placed under direct laparoscopic visualization.    I reflected the greater omentum and the upper abdomen the small bowel in the upper abdomen. I docked the robot to the patient's left side.  I dissected the sigmoid colon from the peritoneum of the left pelvic sidewall. This was done using sharp dissection. The left ureter and iliac vessels were identified and preserved. I scored the base of peritoneum of the right side of the mesentery of the left colon from the ligament of Treitz to the peritoneal reflection of the mid rectum.  I elevated the sigmoid mesentery and enetered into the retro-mesenteric plane. We were able to identify the left ureter and gonadal vessels. We kept those posterior within the retroperitoneum and elevated the left colon mesentery off  that. I did isolated IMA pedicle but did not ligate it yet.  I continued distally and got into the avascular plane posterior to the mesorectum.  The posterior plane was somewhat distorted due to his radiation treatment.  I took this down to the level of the coccyx  posteriorly.I mobilized the peritoneal coverings towards the peritoneal reflection on both the right and left sides of the rectum.  I could see the right and left ureters and stayed away from them. I connected this anterior and began to dissect out the anterior rectal plane.  I mobilized the lateral mesorectal plane using blunt dissection and electrocautery. Once the rectum was free circumferentially, I dissected a little bit further anteriorly.  There was a small laceration in the anterior rectum that was not full thickness. This was sutured closed with a 3-0 V lock suture.  I then divided the white line of [place] up the lateral side of the left colon up to the splenic flexure. The retroperitoneal plane was also freed using blunt dissection.   I skeletonized the inferior mesenteric artery pedicle.  It was too large to take with the vessel sealer. After confirming the left ureter was out of the way, I went ahead and ligated the inferior mesenteric artery Pedicle with a vascular load robotic stapler.  We ensured hemostasis. I skeletonized the mesorectum at the Level of the distal tattoo. This was done using the vessel sealer.  I then transected the rectum using 3 green load robotic staplers.  Once I was sure that the colon would mobilize down into the pelvis without difficulty I desufflated the abdomen. I enlarged the 12 mm port andplaced a wound protector into this. The colon was brought out through this wound protector and transected at the previously dissected area. A pursestring was placed. The colon was then transected with a scalpel. There was good bleeding noted at the colon edges.  A 29 mm EEA anvil was placed into the open wound around a 2-0 Prolene pursestring. This was tied tightly around the anvil. This was then placed back into the pelvis and the stapler was introduced into the rectum. An anastomosis was created without difficulty. There was no tension on the anastomosis. There was no leak when tested  underwater. Hemostasis was good The omentum was then brought back over the small bowel and a 19 [place] Blake drain was placed into the pelvis and brought out through the right lower quadrant port site. We then switched to clean gowns, gloves, instruments and drapes.  The drain was sewed into place with a 2-0 nylon suture. The posterior fascia and peritoneum Of the extraction site was closed using A 2-0 Vicryl suture. The anterior fascia was closed using interrupted 0 Novafil sutures. The subcutaneous tissue was closed using interrupted 2-0 Vicryl sutures. The skin was closed using a running 4-0 Vicryl suture. Port sites were also closed with 4-0 Vicryl sutures. The sterile dressing was applied. The patient was awakened from anesthesia and sent to the post anesthesia care unit in stable condition. All counts were correct per operating room staff.

## 2016-08-04 NOTE — Transfer of Care (Signed)
Immediate Anesthesia Transfer of Care Note  Patient: Jermaine Smith  Procedure(s) Performed: Procedure(s): XI ROBOTIC ASSISTED LOWER ANTERIOR RESECTION WITH RIGID PROCTOSCOPY (N/A)  Patient Location: PACU  Anesthesia Type:General  Level of Consciousness:  sedated, patient cooperative and responds to stimulation  Airway & Oxygen Therapy:Patient Spontanous Breathing and Patient connected to face mask oxgen  Post-op Assessment:  Report given to PACU RN and Post -op Vital signs reviewed and stable  Post vital signs:  Reviewed and stable  Last Vitals:  Vitals:   08/04/16 0645  BP: 140/74  Pulse: 78  Resp: 18  Temp: [id number] C    Complications: No apparent anesthesia complications

## 2016-08-04 NOTE — Anesthesia Procedure Notes (Signed)
Procedure Name: Intubation Date/Time: 08/04/2016 8:38 AM Performed by: Anne Fu Pre-anesthesia Checklist: Patient identified, Emergency Drugs available, Suction available, Patient being monitored and Timeout performed Patient Re-evaluated:Patient Re-evaluated prior to inductionOxygen Delivery Method: Circle system utilized Preoxygenation: Pre-oxygenation with 100% oxygen Intubation Type: IV induction Ventilation: Mask ventilation without difficulty Laryngoscope Size: Mac and 4 Grade View: Grade I Tube type: Oral Tube size: 7.5 mm Number of attempts: 1 Airway Equipment and Method: Stylet Placement Confirmation: ETT inserted through vocal cords under direct vision,  positive ETCO2,  CO2 detector and breath sounds checked- equal and bilateral Secured at: 23 cm Tube secured with: Tape Dental Injury: Teeth and Oropharynx as per pre-operative assessment

## 2016-08-04 NOTE — H&P (Signed)
The patient is a 65 year old male who presents with colorectal cancer. 65 year old male who underwent colonoscopy due to several episodes of hematochezia. He was found to have a proximal rectal cancer proven by biopsies. CT scan of the chest abdomen and pelvis show no signs of metastatic disease. There was one enlarged perirectal lymph nodes noted. There were some small pulmonary nodules and granulomas. CEA level was normal. The tumor was thought to be approximately 10 cm from anal verge at its lowest margin. It was tattooed distally. Ultrasound shows a T3 N1 tumor. He is scheduled to see radiation oncology and oncology later this week to discuss neoadjuvant therapy.   Problem List/Past Medical  RECTAL CANCER (C[facility])  Other Problems Leighton Ruff, MD; [id number] 12:32 PM) Inguinal Hernia Gastroesophageal Reflux Disease  Past Surgical History Leighton Ruff, MD; [id number] 12:32 PM) Colon Polyp Removal - Colonoscopy Laparoscopic Inguinal Hernia Surgery Bilateral. multiple  Diagnostic Studies History Leighton Ruff, MD; [id number] 12:32 PM) Colonoscopy within last year  Allergies Elbert Ewings, CMA; 06/01/2016 9:44 AM) No Known Drug Allergies 03/30/2016  Medication History Leighton Ruff, MD; [id number] 12:32 PM) NexIUM (40MG  Capsule DR, Oral) Active. Multiple Vitamin (Oral) Active. Valtrex (500MG  Tablet, Oral) Active. Aspirin (81MG  Tablet, Oral) Active.   Social History Leighton Ruff, MD; [id number] 12:32 PM) Caffeine use Carbonated beverages, Coffee. Alcohol use Occasional alcohol use. Tobacco use Former smoker. No drug use  Family History Leighton Ruff, MD; [id number] 12:32 PM) Heart Disease Father.  BP 140/74   Pulse 78   Temp 97.5 F (36.4 C) (Oral)   Resp 18   Ht 6\' 4"  (1.93 m)   Wt 95.3 kg (210 lb)   SpO2 98%   BMI 25.56 kg/m    Physical Exam   General Mental Status-Alert. General Appearance-Not in acute distress. Build &  Nutrition-Well nourished. Posture-Normal posture. Gait-Normal.  Head and Neck Head-normocephalic, atraumatic with no lesions or palpable masses. Trachea-midline.  Chest and Lung Exam Chest and lung exam reveals -on auscultation, normal breath sounds, no adventitious sounds and normal vocal resonance.  Cardiovascular Cardiovascular examination reveals -normal heart sounds, regular rate and rhythm with no murmurs.  Abdomen Inspection Inspection of the abdomen reveals - No Hernias. Palpation/Percussion Palpation and Percussion of the abdomen reveal - Soft, Non Tender, No Rigidity (guarding), No hepatosplenomegaly and No Palpable abdominal masses.  Neurologic Neurologic evaluation reveals -alert and oriented x 3 with no impairment of recent or remote memory, normal attention span and ability to concentrate, normal sensation and normal coordination.  Musculoskeletal Normal Exam - Bilateral-Upper Extremity Strength Normal and Lower Extremity Strength Normal.    Assessment & Plan )  RECTAL CANCER (C20) Impression: 65 year old male with a proximal rectal cancer status post chemotherapy and radiation for stage III tumor. He has now finished his treatment and is ready to discuss surgery.  All questions were answered. The surgery and anatomy were described to the patient as well as the risks of surgery and the possible complications. These include: Bleeding, deep abdominal infections and possible wound complications such as hernia and infection, damage to adjacent structures, leak of surgical connections, which can lead to other surgeries and possibly an ostomy, possible need for other procedures, such as abscess drains in radiology, possible prolonged hospital stay, possible diarrhea from removal of part of the colon, possible constipation from narcotics, possible bowel, bladder or sexual dysfunction if having rectal surgery, prolonged fatigue/weakness or appetite  loss, possible early recurrence of of disease, possible complications of their medical problems such as heart  disease or arrhythmias or lung problems, death (less than 1%). I believe the patient understands and wishes to proceed with the surgery.

## 2016-08-05 LAB — BASIC METABOLIC PANEL
ANION GAP: 7 (ref 5–15)
BUN: 23 mg/dL — AB (ref 6–20)
CALCIUM: 9.1 mg/dL (ref 8.9–10.3)
CO2: 26 mmol/L (ref 22–32)
CREATININE: 1.26 mg/dL — AB (ref 0.61–1.24)
Chloride: 104 mmol/L (ref 101–111)
GFR calc Af Amer: >60 mL/min (ref >60)
GFR, EST NON AFRICAN AMERICAN: 59 mL/min — AB (ref >60)
GLUCOSE: 103 mg/dL — AB (ref 65–99)
Potassium: 4.6 mmol/L (ref 3.5–5.1)
Sodium: 137 mmol/L (ref 135–145)

## 2016-08-05 LAB — CBC
HCT: 35.6 % — ABNORMAL LOW (ref 39.0–52.0)
HEMOGLOBIN: 12.6 g/dL — AB (ref 13.0–17.0)
MCH: 31.7 pg (ref 26.0–34.0)
MCHC: 35.4 g/dL (ref 30.0–36.0)
MCV: 89.4 fL (ref 78.0–100.0)
PLATELETS: 189 10*3/uL (ref 150–400)
RBC: 3.98 MIL/uL — ABNORMAL LOW (ref 4.22–5.81)
RDW: 13.1 % (ref 11.5–15.5)
WBC: 8.2 10*3/uL (ref 4.0–10.5)

## 2016-08-05 NOTE — Progress Notes (Addendum)
1 Day Post-Op robotic LAR Subjective: Doing well.  No nausea.  Pain controlled.  Having flatus  Objective: Vital signs in last 24 hours: Temp:  [97.4 F (36.3 C)-98.7 F (37.1 C)] 98.3 F (36.8 C) (09/21 1016) Pulse Rate:  [75-115] 79 (09/21 1016) Resp:  [11-26] 18 (09/21 1016) BP: (123-184)/(74-102) 123/74 (09/21 1016) SpO2:  [94 %-100 %] 99 % (09/21 1016)   Intake/Output from previous day: 09/20 0701 - 09/21 0700 In: 1605 [I.V.:1605] Out: 1640 [Urine:1350; Drains:240; Blood:50] Intake/Output this shift: No intake/output data recorded.   General appearance: alert and cooperative GI: soft, non-distended JP: SS fluid Incision: no significant drainage  Lab Results:   Recent Labs  08/05/16 0408  WBC 8.2  HGB 12.6*  HCT 35.6*  PLT 189   BMET  Recent Labs  08/05/16 0408  NA 137  K 4.6  CL 104  CO2 26  GLUCOSE 103*  BUN 23*  CREATININE 1.26*  CALCIUM 9.1   PT/INR No results for input(s): LABPROT, INR in the last 72 hours. ABG No results for input(s): PHART, HCO3 in the last 72 hours.  Invalid input(s): PCO2, PO2  MEDS, Scheduled . acetaminophen  1,000 mg Oral Q6H  . alvimopan  12 mg Oral BID  . enoxaparin (LOVENOX) injection  40 mg Subcutaneous Q24H  . Influenza vac split quadrivalent PF  0.5 mL Intramuscular Tomorrow-1000  . mouth rinse  15 mL Mouth Rinse BID  . pantoprazole  40 mg Oral Daily    Studies/Results: No results found.  Assessment: s/p Procedure(s): XI ROBOTIC ASSISTED LOWER ANTERIOR RESECTION WITH RIGID PROCTOSCOPY Patient Active Problem List   Diagnosis Date Noted  . Rectal cancer ([place]) 04/02/2016  . Iron deficiency anemia 04/02/2016  . Recurrent right inguinal hernia 05/29/2012  . Left inguinal hernia 04/05/2012    Expected post op course  Plan: Advance diet to clears and fulls if tolerated Ambulate Decrease MIV   LOS: 1 day     .Rosario Adie, MD [facility], [place]   08/05/2016 10:21  AM

## 2016-08-05 NOTE — Discharge Instructions (Addendum)
SURGERY: POST OP INSTRUCTIONS (Surgery for small bowel obstruction, colon resection, etc)   ######################################################################  EAT Gradually transition to a high fiber diet with a fiber supplement over the next few days after discharge  WALK Walk an hour a day.  Control your pain to do that.    CONTROL PAIN Control pain so that you can walk, sleep, tolerate sneezing/coughing, go up/down stairs.  HAVE A BOWEL MOVEMENT DAILY Keep your bowels regular to avoid problems.  OK to try a laxative to override constipation.  OK to use an antidairrheal to slow down diarrhea.  Call if not better after 2 tries  CALL IF YOU HAVE PROBLEMS/CONCERNS Call if you are still struggling despite following these instructions. Call if you have concerns not answered by these instructions  ######################################################################   DIET Follow a light diet the first few days at home.  Start with a bland diet such as soups, liquids, starchy foods, low fat foods, etc.  If you feel full, bloated, or constipated, stay on a ful liquid or pureed/blenderized diet for a few days until you feel better and no longer constipated. Be sure to drink plenty of fluids every day to avoid getting dehydrated (feeling dizzy, not urinating, etc.). Gradually add a fiber supplement to your diet over the next week.  Gradually get back to a regular solid diet.  Avoid fast food or heavy meals the first week as you are more likely to get nauseated. It is expected for your digestive tract to need a few months to get back to normal.  It is common for your bowel movements and stools to be irregular.  You will have occasional bloating and cramping that should eventually fade away.  Until you are eating solid food normally, off all pain medications, and back to regular activities; your bowels will not be normal. Focus on eating a low-fat, high fiber diet the rest of your life  (See Getting to Good Bowel Health, below).  CARE of your INCISION or WOUND It is good for closed incision and even open wounds to be washed every day.  Shower every day.  Short baths are fine.  Wash the incisions and wounds clean with soap & water.    If you have a closed incision(s), wash the incision with soap & water every day.  You may leave closed incisions open to air if it is dry.   You may cover the incision with clean gauze & replace it after your daily shower for comfort. If you have skin tapes (Steristrips) or skin glue (Dermabond) on your incision, leave them in place.  They will fall off on their own like a scab.  You may trim any edges that curl up with clean scissors.  If you have staples, set up an appointment for them to be removed in the office in 10 days after surgery.  If you have a drain, wash around the skin exit site with soap & water and place a new dressing of gauze or band aid around the skin every day.  Keep the drain site clean & dry.    If you have an open wound with packing, see wound care instructions.  In general, it is encouraged that you remove your dressing and packing, shower with soap & water, and replace your dressing once a day.  Pack the wound with clean gauze moistened with normal (0.9%) saline to keep the wound moist & uninfected.  Pressure on the dressing for 30 minutes will stop most wound   bleeding.  Eventually your body will heal & pull the open wound closed over the next few months.  Raw open wounds will occasionally bleed or secrete yellow drainage until it heals closed.  Drain sites will drain a little until the drain is removed.  Even closed incisions can have mild bleeding or drainage the first few days until the skin edges scab over & seal.   If you have an open wound with a wound vac, see wound vac care instructions.     ACTIVITIES as tolerated Start light daily activities --- self-care, walking, climbing stairs-- beginning the day after surgery.   Gradually increase activities as tolerated.  Control your pain to be active.  Stop when you are tired.  Ideally, walk several times a day, eventually an hour a day.   Most people are back to most day-to-day activities in a few weeks.  It takes 4-8 weeks to get back to unrestricted, intense activity. If you can walk 30 minutes without difficulty, it is safe to try more intense activity such as jogging, treadmill, bicycling, low-impact aerobics, swimming, etc. Save the most intensive and strenuous activity for last (Usually 4-8 weeks after surgery) such as sit-ups, heavy lifting, contact sports, etc.  Refrain from any intense heavy lifting or straining until you are off narcotics for pain control.  You will have off days, but things should improve week-by-week. DO NOT PUSH THROUGH PAIN.  Let pain be your guide: If it hurts to do something, don't do it.  Pain is your body warning you to avoid that activity for another week until the pain goes down. You may drive when you are no longer taking narcotic prescription pain medication, you can comfortably wear a seatbelt, and you can safely make sudden turns/stops to protect yourself without hesitating due to pain. You may have sexual intercourse when it is comfortable. If it hurts to do something, stop.  MEDICATIONS Take your usually prescribed home medications unless otherwise directed.   Blood thinners:  Usually you can restart any strong blood thinners after the second postoperative day.  It is OK to take aspirin right away.     If you are on strong blood thinners (warfarin/Coumadin, Plavix, Xerelto, Eliquis, Pradaxa, etc), discuss with your surgeon, medicine PCP, and/or cardiologist for instructions on when to restart the blood thinner & if blood monitoring is needed (PT/INR blood check, etc).     PAIN CONTROL Pain after surgery or related to activity is often due to strain/injury to muscle, tendon, nerves and/or incisions.  This pain is usually  short-term and will improve in a few months.  To help speed the process of healing and to get back to regular activity more quickly, DO THE FOLLOWING THINGS TOGETHER: 1. Increase activity gradually.  DO NOT PUSH THROUGH PAIN 2. Use Ice and/or Heat 3. Try Gentle Massage and/or Stretching 4. Take over the counter pain medication 5. Take Narcotic prescription pain medication for more severe pain  Good pain control = faster recovery.  It is better to take more medicine to be more active than to stay in bed all day to avoid medications. 1.  Increase activity gradually Avoid heavy lifting at first, then increase to lifting as tolerated over the next 6 weeks. Do not "push through" the pain.  Listen to your body and avoid positions and maneuvers than reproduce the pain.  Wait a few days before trying something more intense Walking an hour a day is encouraged to help your body recover faster   and more safely.  Start slowly and stop when getting sore.  If you can walk 30 minutes without stopping or pain, you can try more intense activity (running, jogging, aerobics, cycling, swimming, treadmill, sex, sports, weightlifting, etc.) Remember: If it hurts to do it, then don't do it! 2. Use Ice and/or Heat You will have swelling and bruising around the incisions.  This will take several weeks to resolve. Ice packs or heating pads (6-8 times a day, 30-60 minutes at a time) will help sooth soreness & bruising. Some people prefer to use ice alone, heat alone, or alternate between ice & heat.  Experiment and see what works best for you.  Consider trying ice for the first few days to help decrease swelling and bruising; then, switch to heat to help relax sore spots and speed recovery. Shower every day.  Short baths are fine.  It feels good!  Keep the incisions and wounds clean with soap & water.   3. Try Gentle Massage and/or Stretching Massage at the area of pain many times a day Stop if you feel pain - do not  overdo it 4. Take over the counter pain medication This helps the muscle and nerve tissues become less irritable and calm down faster Choose ONE of the following over-the-counter anti-inflammatory medications: Acetaminophen 500mg tabs (Tylenol) 1-2 pills with every meal and just before bedtime (avoid if you have liver problems or if you have acetaminophen in you narcotic prescription) Naproxen 220mg tabs (ex. Aleve, Naprosyn) 1-2 pills twice a day (avoid if you have kidney, stomach, IBD, or bleeding problems) Ibuprofen 200mg tabs (ex. Advil, Motrin) 3-4 pills with every meal and just before bedtime (avoid if you have kidney, stomach, IBD, or bleeding problems) Take with food/snack several times a day as directed for at least 2 weeks to help keep pain / soreness down & more manageable. 5. Take Narcotic prescription pain medication for more severe pain A prescription for strong pain control is often given to you upon discharge (for example: oxycodone/Percocet, hydrocodone/Norco/Vicodin, or tramadol/Ultram) Take your pain medication as prescribed. Be mindful that most narcotic prescriptions contain Tylenol (acetaminophen) as well - avoid taking too much Tylenol. If you are having problems/concerns with the prescription medicine (does not control pain, nausea, vomiting, rash, itching, etc.), please call us [phone] to see if we need to switch you to a different pain medicine that will work better for you and/or control your side effects better. If you need a refill on your pain medication, you must call the office before 4 pm and on weekdays only.  By federal law, prescriptions for narcotics cannot be called into a pharmacy.  They must be filled out on paper & picked up from our office by the patient or authorized caretaker.  Prescriptions cannot be filled after 4 pm nor on weekends.    WHEN TO CALL US [phone] Severe uncontrolled or worsening pain  Fever over 101 F (38.5 C) Concerns with  the incision: Worsening pain, redness, rash/hives, swelling, bleeding, or drainage Reactions / problems with new medications (itching, rash, hives, nausea, etc.) Nausea and/or vomiting Difficulty urinating Difficulty breathing Worsening fatigue, dizziness, lightheadedness, blurred vision Other concerns If you are not getting better after two weeks or are noticing you are getting worse, contact our office [phone] for further advice.  We may need to adjust your medications, re-evaluate you in the office, send you to the emergency room, or see what other things we can do to help. The   clinic staff is available to answer your questions during regular business hours (8:30am-5pm).  Please don't hesitate to call and ask to speak to one of our nurses for clinical concerns.    A surgeon from [facility] is always on call at the hospitals 24 hours/day If you have a medical emergency, go to the nearest emergency room or call 911.  FOLLOW UP in our office One the day of your discharge from the hospital (or the next business weekday), please call [facility] to set up or confirm an appointment to see your surgeon in the office for a follow-up appointment.  Usually it is 2-3 weeks after your surgery.   If you have skin staples at your incision(s), let the office know so we can set up a time in the office for the nurse to remove them (usually around 10 days after surgery). Make sure that you call for appointments the day of discharge (or the next business weekday) from the hospital to ensure a convenient appointment time. IF YOU HAVE DISABILITY OR FAMILY LEAVE FORMS, BRING THEM TO THE OFFICE FOR PROCESSING.  DO NOT GIVE THEM TO YOUR DOCTOR.  [facility] [address], [address], [place], [place]  [address] ? [phone] - Main [phone] - Toll Free,  [phone] - Fax [email or web address]  GETTING TO GOOD BOWEL HEALTH. It is  expected for your digestive tract to need a few months to get back to normal.  It is common for your bowel movements and stools to be irregular.  You will have occasional bloating and cramping that should eventually fade away.  Until you are eating solid food normally, off all pain medications, and back to regular activities; your bowels will not be normal.   Avoiding constipation The goal: ONE SOFT BOWEL MOVEMENT A DAY!    Drink plenty of fluids.  Choose water first. TAKE A FIBER SUPPLEMENT EVERY DAY THE REST OF YOUR LIFE During your first week back home, gradually add back a fiber supplement every day Experiment which form you can tolerate.   There are many forms such as powders, tablets, wafers, gummies, etc Psyllium bran (Metamucil), methylcellulose (Citrucel), Miralax or Glycolax, Benefiber, Flax Seed.  Adjust the dose week-by-week (1/2 dose/day to 6 doses a day) until you are moving your bowels 1-2 times a day.  Cut back the dose or try a different fiber product if it is giving you problems such as diarrhea or bloating. Sometimes a laxative is needed to help jump-start bowels if constipated until the fiber supplement can help regulate your bowels.  If you are tolerating eating & you are farting, it is okay to try a gentle laxative such as double dose MiraLax, prune juice, or Milk of Magnesia.  Avoid using laxatives too often. Stool softeners can sometimes help counteract the constipating effects of narcotic pain medicines.  It can also cause diarrhea, so avoid using for too long. If you are still constipated despite taking fiber daily, eating solids, and a few doses of laxatives, call our office. Controlling diarrhea Try drinking liquids and eating bland foods for a few days to avoid stressing your intestines further. Avoid dairy products (especially milk & ice cream) for a short time.  The intestines often can lose the ability to digest lactose when stressed. Avoid foods that cause gassiness or  bloating.  Typical foods include beans and other legumes, cabbage, broccoli, and dairy foods.  Avoid greasy, spicy, fast foods.  Every person has   some sensitivity to other foods, so listen to your body and avoid those foods that trigger problems for you. Probiotics (such as active yogurt, Align, etc) may help repopulate the intestines and colon with normal bacteria and calm down a sensitive digestive tract Adding a fiber supplement gradually can help thicken stools by absorbing excess fluid and retrain the intestines to act more normally.  Slowly increase the dose over a few weeks.  Too much fiber too soon can backfire and cause cramping & bloating. It is okay to try and slow down diarrhea with a few doses of antidiarrheal medicines.   Bismuth subsalicylate (ex. Kayopectate, Pepto Bismol) for a few doses can help control diarrhea.  Avoid if pregnant.   Loperamide (Imodium) can slow down diarrhea.  Start with one tablet (2mg) first.  Avoid if you are having fevers or severe pain.  ILEOSTOMY PATIENTS WILL HAVE CHRONIC DIARRHEA since their colon is not in use.    Drink plenty of liquids.  You will need to drink even more glasses of water/liquid a day to avoid getting dehydrated. Record output from your ileostomy.  Expect to empty the bag every 3-4 hours at first.  Most people with a permanent ileostomy empty their bag 4-6 times at the least.   Use antidiarrheal medicine (especially Imodium) several times a day to avoid getting dehydrated.  Start with a dose at bedtime & breakfast.  Adjust up or down as needed.  Increase antidiarrheal medications as directed to avoid emptying the bag more than 8 times a day (every 3 hours). Work with your wound ostomy nurse to learn care for your ostomy.  See ostomy care instructions. TROUBLESHOOTING IRREGULAR BOWELS 1) Start with a soft & bland diet. No spicy, greasy, or fried foods.  2) Avoid gluten/wheat or dairy products from diet to see if symptoms improve. 3) Miralax  17gm or flax seed mixed in 8oz. water or juice-daily. May use 2-4 times a day as needed. 4) Gas-X, Phazyme, etc. as needed for gas & bloating.  5) Prilosec (omeprazole) over-the-counter as needed 6)  Consider probiotics (Align, Activa, etc) to help calm the bowels down  Call your doctor if you are getting worse or not getting better.  Sometimes further testing (cultures, endoscopy, X-ray studies, CT scans, bloodwork, etc.) may be needed to help diagnose and treat the cause of the diarrhea. [facility] [address], [address], [place], [place]  [address] [phone] - Main.    [phone]  - Toll Free.   [phone] - Fax [email or web address]   DRAIN CARE:   You have a closed bulb drain to help you heal.    A bulb drain is a small, plastic reservoir which creates a gentle suction. It is used to remove excess fluid from a surgical wound. The color and amount of fluid will vary. Immediately after surgery, the fluid is bright red. It may gradually change to a yellow color. When the amount decreases to about 1 or 2 tablespoons (15 to 30 cc) per 24 hours, your caregiver will usually remove it.  JP Care  The Jackson-Pratt drainage system has flexible tubing attached to a soft, plastic bulb with a stopper. The drainage end of the tubing, which is flat and white, goes into your body through a small opening near your incision (surgical cut). A stitch holds the drainage end in place. The rest of the tube is outside your body, attached to the bulb. When the bulb is compressed with the stopper   in place, it creates a vacuum. This causes a constant gentle suction, which helps draw out fluid that collects under your incision. The bulb should be compressed at all times, except when you are emptying the drainage.  How long you will have your Jackson-Pratt depends on your surgery and the amount of fluid is draining. This is different for everyone. The Jackson-Pratt is  usually removed when the drainage is 30 mL or less over 24 hours. To keep track of how much drainage you're having, you will record the amount in a drainage log. It's important to bring the log with you to your follow-up appointments.  Caring for Your Jackson-Pratt at Home In order to care for your Jackson-Pratt at home, you or your caregiver will do the following:  Empty the drain once a day and record the color and amount of drainage  Care for the area where the tubing enters your skin by washing with soap and water.  Milk the tubing to help move clots into the bulb.  Do this before you empty and measure your drainage. Look in the mirror at the tubing. This will help you see where your hands need to be. Pinch the tubing close to where it goes into your skin between your thumb and forefinger. With the thumb and forefinger of your other hand, pinch the tubing right below your other fingers. Keep your fingers pinched and slide them down the tubing, pushing any clots down toward the bulb. You may want to use alcohol swabs to help you slide your fingers down the tubing. Repeat steps 3 and 4 as necessary to push clots from the tubing into the bulb. If you are not able to move a clot into the bulb, call your doctor's office. The fluid may leak around the insertion site if a clot is blocking the drainage flow. If there is fluid in the bulb and no leakage at the insertion site, the drain is working.  How to Empty Your Jackson-Pratt and Record the Drainage You will need to empty your Jackson-Pratt every day  Gather the following supplies:  Measuring container your nurse gave you Jackson-Pratt Drainage Record  Pen or pencil  Instructions Clean an area to work on. Clean your hands thoroughly. Unplug the stopper on top of your Jackson-Pratt. This will cause the bulb to expand. Do not touch the inside of the stopper or the inner area of the opening on the bulb. Turn your Jackson-Pratt upside down,  gently squeeze the bulb, and pour the drainage into the measuring container. Turn your Jackson-Pratt right side up. Squeeze the bulb until your fingers feel the palm of your hand. Keep squeezing the bulb while you replug the stopper. Make sure the bulb stays fully compressed to ensure constant, gentle suction.    Check the amount and color of drainage in the measuring container. The first couple days after surgery the fluid may be dark red. This is normal. As you heal the fluid may look pink or pale yellow. Record this amount and the color of drainage on your Jackson-Pratt Drainage Record. Flush the drainage down the toilet and rinse the measuring container with water.  Caring for the Insertion Site  Once you have emptied the drainage, clean your hands again. Check the area around the insertion site. Look for tenderness, swelling, or pus. If you have any of these, or if you have a temperature of 101 F (38.3 C) or higher, you may have an infection. Call your doctor's office.    Sometimes, the drain causes redness the size of a dime at your insertion site. This is normal. Your healthcare provider will tell you if you should place a bandage over the insertion site.  Wash drain site with soap & water (dilute hydrogen peroxide PRN) daily & replace clean dressing / tape    DAILY CARE  Keep the bulb compressed at all times, except while emptying it. The compression creates suction.   Keep sites where the tubes enter the skin dry and covered with a light bandage (dressing).   Tape the tubes to your skin, 1 to 2 inches below the insertion sites, to keep from pulling on your stitches. Tubes are stitched in place and will not slip out.   Pin the bulb to your shirt (not to your pants) with a safety pin.   For the first few days after surgery, there usually is more fluid in the bulb. Empty the bulb whenever it becomes half full because the bulb does not create enough suction if it is too full.  Include this amount in your 24 hour totals.   When the amount of drainage decreases, empty the bulb at the same time every day. Write down the amounts and the 24 hour totals. Your caregiver will want to know them. This helps your caregiver know when the tubes can be removed.   (We anticipate removing the drain in 1-3 weeks, depending on when the output is <30mL a day for 2+ days)  If there is drainage around the tube sites, change dressings and keep the area dry. If you see a clot in the tube, leave it alone. However, if the tube does not appear to be draining, let your caregiver know.  TO EMPTY THE BULB  Open the stopper to release suction.   Holding the stopper out of the way, pour drainage into the measuring cup that was sent home with you.   Measure and write down the amount. If there are 2 bulbs, note the amount of drainage from bulb 1 or bulb 2 and keep the totals separate. Your caregiver will want to know which tube is draining more.   Compress the bulb by folding it in half.   Replace the stopper.   Check the tape that holds the tube to your skin, and pin the bulb to your shirt.  SEEK MEDICAL CARE IF:  The drainage develops a bad odor.   You have an oral temperature above 102 F (38.9 C).   The amount of drainage from your wound suddenly increases or decreases.   You accidentally pull out your drain.   You have any other questions or concerns.  MAKE SURE YOU:   Understand these instructions.   Will watch your condition.   Will get help right away if you are not doing well or get worse.     Call our office if you have any questions about your drain. [phone]  

## 2016-08-06 LAB — BASIC METABOLIC PANEL
ANION GAP: 4 — AB (ref 5–15)
BUN: 21 mg/dL — ABNORMAL HIGH (ref 6–20)
CO2: 27 mmol/L (ref 22–32)
Calcium: 8.6 mg/dL — ABNORMAL LOW (ref 8.9–10.3)
Chloride: 107 mmol/L (ref 101–111)
Creatinine, Ser: 1.2 mg/dL (ref 0.61–1.24)
GLUCOSE: 96 mg/dL (ref 65–99)
POTASSIUM: 4.2 mmol/L (ref 3.5–5.1)
Sodium: 138 mmol/L (ref 135–145)

## 2016-08-06 LAB — CBC
HEMATOCRIT: 35.4 % — AB (ref 39.0–52.0)
HEMOGLOBIN: 12.1 g/dL — AB (ref 13.0–17.0)
MCH: 31.2 pg (ref 26.0–34.0)
MCHC: 34.2 g/dL (ref 30.0–36.0)
MCV: 91.2 fL (ref 78.0–100.0)
Platelets: 163 10*3/uL (ref 150–400)
RBC: 3.88 MIL/uL — AB (ref 4.22–5.81)
RDW: 13.6 % (ref 11.5–15.5)
WBC: 5.1 10*3/uL (ref 4.0–10.5)

## 2016-08-06 MED ORDER — SODIUM CHLORIDE 0.9% FLUSH
3.0000 mL | Freq: Two times a day (BID) | INTRAVENOUS | Status: DC
  Administered 2016-08-06 – 2016-08-07 (×2): 3 mL via INTRAVENOUS

## 2016-08-06 MED ORDER — SODIUM CHLORIDE 0.9 % IV SOLN: 250.0000 mL | INTRAVENOUS | Status: DC | PRN

## 2016-08-06 MED ORDER — SODIUM CHLORIDE 0.9% FLUSH: 3.0000 mL | INTRAVENOUS | Status: DC | PRN

## 2016-08-06 NOTE — Progress Notes (Signed)
2 Days Post-Op robotic LAR Subjective: Doing well.  No nausea.  Pain controlled.  Having BM's.    Objective: Vital signs in last 24 hours: Temp:  [97.7 F (36.5 C)-99 F (37.2 C)] 98.5 F (36.9 C) (09/22 0618) Pulse Rate:  [69-86] 69 (09/22 0618) Resp:  [18-20] 20 (09/22 0618) BP: (121-142)/(65-81) 131/81 (09/22 0618) SpO2:  [97 %-99 %] 99 % (09/22 0618)   Intake/Output from previous day: 09/21 0701 - 09/22 0700 In: 1615.8 [I.V.:1615.8] Out: 2590 [Urine:2525; Drains:65] Intake/Output this shift: No intake/output data recorded.   General appearance: alert and cooperative GI: soft, non-distended JP: SS fluid Incision: no significant drainage  Lab Results:   Recent Labs  08/05/16 0408 08/06/16 0332  WBC 8.2 5.1  HGB 12.6* 12.1*  HCT 35.6* 35.4*  PLT 189 163   BMET  Recent Labs  08/05/16 0408 08/06/16 0332  NA 137 138  K 4.6 4.2  CL 104 107  CO2 26 27  GLUCOSE 103* 96  BUN 23* 21*  CREATININE 1.26* 1.20  CALCIUM 9.1 8.6*   PT/INR No results for input(s): LABPROT, INR in the last 72 hours. ABG No results for input(s): PHART, HCO3 in the last 72 hours.  Invalid input(s): PCO2, PO2  MEDS, Scheduled . enoxaparin (LOVENOX) injection  40 mg Subcutaneous Q24H  . Influenza vac split quadrivalent PF  0.5 mL Intramuscular Tomorrow-1000  . mouth rinse  15 mL Mouth Rinse BID  . pantoprazole  40 mg Oral Daily    Studies/Results: No results found.  Assessment: s/p Procedure(s): XI ROBOTIC ASSISTED LOWER ANTERIOR RESECTION WITH RIGID PROCTOSCOPY Patient Active Problem List   Diagnosis Date Noted  . Rectal cancer ([place]) 04/02/2016  . Iron deficiency anemia 04/02/2016  . Recurrent right inguinal hernia 05/29/2012  . Left inguinal hernia 04/05/2012    Expected post op course  Plan: Advance diet to soft foods Ambulate Saline lock MIV   LOS: 2 days     .Jermaine Smith, [facility], [place]   08/06/2016 8:42  AM

## 2016-08-06 NOTE — Anesthesia Postprocedure Evaluation (Signed)
Anesthesia Post Note  Patient: Huston Dittus  Procedure(s) Performed: Procedure(s) (LRB): XI ROBOTIC ASSISTED LOWER ANTERIOR RESECTION WITH RIGID PROCTOSCOPY (N/A)  Patient location during evaluation: PACU Anesthesia Type: General Level of consciousness: awake and alert Pain management: pain level controlled Vital Signs Assessment: post-procedure vital signs reviewed and stable Respiratory status: spontaneous breathing, nonlabored ventilation, respiratory function stable and patient connected to nasal cannula oxygen Cardiovascular status: blood pressure returned to baseline and stable Postop Assessment: no signs of nausea or vomiting Anesthetic complications: no    Last Vitals:  Vitals:   08/06/16 0252 08/06/16 0618  BP: 121/70 131/81  Pulse: 69 69  Resp: 20 20  Temp: 36.9 C 36.9 C    Last Pain:  Vitals:   08/06/16 0825  TempSrc:   PainSc: 5                  Couper Juncaj Minda Meo

## 2016-08-07 LAB — CBC
HCT: 36.4 % — ABNORMAL LOW (ref 39.0–52.0)
Hemoglobin: 12.8 g/dL — ABNORMAL LOW (ref 13.0–17.0)
MCH: 30.7 pg (ref 26.0–34.0)
MCHC: 35.2 g/dL (ref 30.0–36.0)
MCV: 87.3 fL (ref 78.0–100.0)
PLATELETS: 166 10*3/uL (ref 150–400)
RBC: 4.17 MIL/uL — AB (ref 4.22–5.81)
RDW: 13.1 % (ref 11.5–15.5)
WBC: 4.6 10*3/uL (ref 4.0–10.5)

## 2016-08-07 LAB — BASIC METABOLIC PANEL
ANION GAP: 7 (ref 5–15)
BUN: 21 mg/dL — ABNORMAL HIGH (ref 6–20)
CHLORIDE: 107 mmol/L (ref 101–111)
CO2: 24 mmol/L (ref 22–32)
Calcium: 9 mg/dL (ref 8.9–10.3)
Creatinine, Ser: 1 mg/dL (ref 0.61–1.24)
GFR calc non Af Amer: >60 mL/min (ref >60)
Glucose, Bld: 100 mg/dL — ABNORMAL HIGH (ref 65–99)
POTASSIUM: 4.2 mmol/L (ref 3.5–5.1)
Sodium: 138 mmol/L (ref 135–145)

## 2016-08-07 MED ORDER — METHOCARBAMOL 500 MG PO TABS: 1000.0000 mg | ORAL_TABLET | Freq: Four times a day (QID) | ORAL | Status: DC | PRN

## 2016-08-07 MED ORDER — SODIUM CHLORIDE 0.9% FLUSH: 3.0000 mL | INTRAVENOUS | Status: DC | PRN

## 2016-08-07 MED ORDER — ACETAMINOPHEN 325 MG PO TABS: 325.0000 mg | ORAL_TABLET | Freq: Four times a day (QID) | ORAL | Status: DC | PRN

## 2016-08-07 MED ORDER — METHOCARBAMOL 1000 MG/10ML IJ SOLN
1000.0000 mg | Freq: Four times a day (QID) | INTRAMUSCULAR | Status: DC | PRN
  Filled 2016-08-07: qty 10

## 2016-08-07 MED ORDER — SODIUM CHLORIDE 0.9 % IV SOLN: 250.0000 mL | INTRAVENOUS | Status: DC | PRN

## 2016-08-07 MED ORDER — METHOCARBAMOL 1000 MG/10ML IJ SOLN: 1000.0000 mg | Freq: Four times a day (QID) | INTRAVENOUS | Status: DC | PRN

## 2016-08-07 MED ORDER — TRAMADOL HCL 50 MG PO TABS: 50.0000 mg | ORAL_TABLET | Freq: Four times a day (QID) | ORAL | Status: DC | PRN

## 2016-08-07 MED ORDER — TRAMADOL HCL 50 MG PO TABS: 50.0000 mg | ORAL_TABLET | Freq: Four times a day (QID) | ORAL | 0 refills | Status: DC | PRN

## 2016-08-07 MED ORDER — SODIUM CHLORIDE 0.9% FLUSH
3.0000 mL | Freq: Two times a day (BID) | INTRAVENOUS | Status: DC
Start: 2016-08-07 — End: 2016-08-07

## 2016-08-07 NOTE — Discharge Summary (Addendum)
Physician Discharge Summary  Patient ID: Jermaine Smith MRN: [id number] DOB/AGE: 1951/04/06 65 y.o.  Admit date: 08/04/2016 Discharge date: 08/07/2016  Patient Care Team: Maury Dus, MD as PCP - General (Family Medicine) Ladene Artist, MD as Consulting Physician (Gastroenterology) Leighton Ruff, MD as Consulting Physician (General Surgery) Truitt Merle, MD as Consulting Physician (Hematology) Kyung Rudd, MD as Consulting Physician (Radiation Oncology) Tania Ade, RN as Registered Nurse Michael Boston, MD as Consulting Physician (General Surgery)  Admission Diagnoses: Principal Problem:   Rectal cancer [id number] s/p robotic LAR 08/04/2016   Discharge Diagnoses:  Principal Problem:   Rectal cancer [id number] s/p robotic LAR 08/04/2016   POST-OPERATIVE DIAGNOSIS:   Rectal cancer  SURGERY:  08/04/2016  Procedure(s): XI ROBOTIC ASSISTED LOWER ANTERIOR RESECTION WITH RIGID PROCTOSCOPY  SURGEON:    Surgeon(s): Leighton Ruff, MD Michael Boston, MD - Assist  Consults: None  Hospital Course:   The patient underwent the surgery above.  Postoperatively, the patient gradually mobilized and advanced to a solid diet.  Pain and other symptoms were treated aggressively.    By the time of discharge, the patient was walking well the hallways, eating food, having flatus.  Pain was well-controlled on an oral medications.  Based on meeting discharge criteria and continuing to recover, I felt it was safe for the patient to be discharged from the hospital to further recover with close followup.   Plan drain removal in office in ~2weeks.  Postoperative recommendations were discussed in detail.  Copy of pathology report given.  They are written as well.   Significant Diagnostic Studies:  Results for orders placed or performed during the hospital encounter of 08/04/16 (from the past 72 hour(s))  Basic metabolic panel     Status: Abnormal   Collection Time: 08/05/16  4:08 AM  Result Value Ref  Range   Sodium 137 135 - 145 mmol/L   Potassium 4.6 3.5 - 5.1 mmol/L   Chloride 104 101 - 111 mmol/L   CO2 26 22 - 32 mmol/L   Glucose, Bld 103 (H) 65 - 99 mg/dL   BUN 23 (H) 6 - 20 mg/dL   Creatinine, Ser 1.26 (H) 0.61 - 1.24 mg/dL   Calcium 9.1 8.9 - 10.3 mg/dL   GFR calc non Af Amer 59 (L) >60 mL/min   GFR calc Af Amer >60 >60 mL/min    Comment: (NOTE) The eGFR has been calculated using the CKD EPI equation. This calculation has not been validated in all clinical situations. eGFR's persistently <60 mL/min signify possible Chronic Kidney Disease.    Anion gap 7 5 - 15  CBC     Status: Abnormal   Collection Time: 08/05/16  4:08 AM  Result Value Ref Range   WBC 8.2 4.0 - 10.5 K/uL   RBC 3.98 (L) 4.22 - 5.81 MIL/uL   Hemoglobin 12.6 (L) 13.0 - 17.0 g/dL   HCT 35.6 (L) 39.0 - 52.0 %   MCV 89.4 78.0 - 100.0 fL   MCH 31.7 26.0 - 34.0 pg   MCHC 35.4 30.0 - 36.0 g/dL   RDW 13.1 11.5 - 15.5 %   Platelets 189 150 - 400 K/uL  Basic metabolic panel     Status: Abnormal   Collection Time: 08/06/16  3:32 AM  Result Value Ref Range   Sodium 138 135 - 145 mmol/L   Potassium 4.2 3.5 - 5.1 mmol/L   Chloride 107 101 - 111 mmol/L   CO2 27 22 - 32 mmol/L   Glucose, Bld  96 65 - 99 mg/dL   BUN 21 (H) 6 - 20 mg/dL   Creatinine, Ser 1.20 0.61 - 1.24 mg/dL   Calcium 8.6 (L) 8.9 - 10.3 mg/dL   GFR calc non Af Amer >60 >60 mL/min   GFR calc Af Amer >60 >60 mL/min    Comment: (NOTE) The eGFR has been calculated using the CKD EPI equation. This calculation has not been validated in all clinical situations. eGFR's persistently <60 mL/min signify possible Chronic Kidney Disease.    Anion gap 4 (L) 5 - 15  CBC     Status: Abnormal   Collection Time: 08/06/16  3:32 AM  Result Value Ref Range   WBC 5.1 4.0 - 10.5 K/uL   RBC 3.88 (L) 4.22 - 5.81 MIL/uL   Hemoglobin 12.1 (L) 13.0 - 17.0 g/dL   HCT 35.4 (L) 39.0 - 52.0 %   MCV 91.2 78.0 - 100.0 fL   MCH 31.2 26.0 - 34.0 pg   MCHC 34.2 30.0 -  36.0 g/dL   RDW 13.6 11.5 - 15.5 %   Platelets 163 150 - 400 K/uL  Basic metabolic panel     Status: Abnormal   Collection Time: 08/07/16  4:04 AM  Result Value Ref Range   Sodium 138 135 - 145 mmol/L   Potassium 4.2 3.5 - 5.1 mmol/L   Chloride 107 101 - 111 mmol/L   CO2 24 22 - 32 mmol/L   Glucose, Bld 100 (H) 65 - 99 mg/dL   BUN 21 (H) 6 - 20 mg/dL   Creatinine, Ser 1.00 0.61 - 1.24 mg/dL   Calcium 9.0 8.9 - 10.3 mg/dL   GFR calc non Af Amer >60 >60 mL/min   GFR calc Af Amer >60 >60 mL/min    Comment: (NOTE) The eGFR has been calculated using the CKD EPI equation. This calculation has not been validated in all clinical situations. eGFR's persistently <60 mL/min signify possible Chronic Kidney Disease.    Anion gap 7 5 - 15  CBC     Status: Abnormal   Collection Time: 08/07/16  4:04 AM  Result Value Ref Range   WBC 4.6 4.0 - 10.5 K/uL   RBC 4.17 (L) 4.22 - 5.81 MIL/uL   Hemoglobin 12.8 (L) 13.0 - 17.0 g/dL   HCT 36.4 (L) 39.0 - 52.0 %   MCV 87.3 78.0 - 100.0 fL   MCH 30.7 26.0 - 34.0 pg   MCHC 35.2 30.0 - 36.0 g/dL   RDW 13.1 11.5 - 15.5 %   Platelets 166 150 - 400 K/uL    No results found.  Discharge Exam: Blood pressure 128/84, pulse 79, temperature 98 F (36.7 C), temperature source Oral, resp. rate 20, height '6\' 4"'$  (1.93 m), weight 95.3 kg (210 lb), SpO2 98 %.  General: Pt awake/alert/oriented x4 in no major acute distress Eyes: PERRL, normal EOM. Sclera nonicteric Neuro: CN II-XII intact w/o focal sensory/motor deficits. Lymph: No head/neck/groin lymphadenopathy Psych:  No delerium/psychosis/paranoia HENT: Normocephalic, Mucus membranes moist.  No thrush Neck: Supple, No tracheal deviation Chest: No pain.  Good respiratory excursion. CV:  Pulses intact.  Regular rhythm MS: Normal AROM mjr joints.  No obvious deformity Abdomen: Soft, Nondistended.  Nontender.  No incarcerated hernias.  Incision c/d/i.  Drain serosanguinous Ext:  SCDs BLE.  No significant  edema.  No cyanosis Skin: No petechiae / purpura  Discharged Condition: good   Past Medical History:  Diagnosis Date  . FHx: brain aneurysm   . GERD (  gastroesophageal reflux disease)   . Hiatal hernia   . Left inguinal hernia   . Skin cancer    hx basal cell carcinoma    Past Surgical History:  Procedure Laterality Date  . CLAVICLE SURGERY    . EUS N/A 03/25/2016   Procedure: LOWER ENDOSCOPIC ULTRASOUND (EUS);  Surgeon: Milus Banister, MD;  Location: [facility];  Service: Endoscopy;  Laterality: N/A;  . INGUINAL HERNIA REPAIR  1961   open right inguinal age 21 y/o  . lap bilateral ing. hernia repair Bilateral 05/09/12  . XI ROBOTIC ASSISTED LOWER ANTERIOR RESECTION N/A 08/04/2016   Procedure: XI ROBOTIC ASSISTED LOWER ANTERIOR RESECTION WITH RIGID PROCTOSCOPY;  Surgeon: Leighton Ruff, MD;  Location: [facility];  Service: General;  Laterality: N/A;    Social History   Social History  . Marital status: Married    Spouse name: N/A  . Number of children: 2  . Years of education: N/A   Occupational History  . endoscope specialist [employer]    Works on [place] Topics  . Smoking status: Former Smoker    Packs/day: 0.50    Years: 5.00    Types: Cigarettes    Quit date: 09/03/1978  . Smokeless tobacco: Never Used  . Alcohol use 0.0 oz/week     Comment: ocassional, once a month   . Drug use: No     Comment: still smoking 1ppd,had quit in 1978,   . Sexual activity: Not on file   Other Topics Concern  . 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 Heart disease Father   . Clotting disorder Mother     PE  . Hypotension Mother   . Colon cancer Neg Hx     Current Facility-Administered Medications  Medication Dose Route Frequency Provider Last Rate Last Dose  .  0.9 %  sodium chloride infusion  250 mL Intravenous PRN Leighton Ruff, MD      . 0.9 %  sodium chloride infusion  250 mL Intravenous PRN Michael Boston, MD      . acetaminophen (TYLENOL) tablet 325-650 mg  325-650 mg Oral Q6H PRN Michael Boston, MD      . diphenhydrAMINE (BENADRYL) 12.5 MG/5ML elixir 12.5 mg  12.5 mg Oral G5X PRN Leighton Ruff, MD       Or  . diphenhydrAMINE (BENADRYL) injection 12.5 mg  12.5 mg Intravenous M4W PRN Leighton Ruff, MD      . enoxaparin (LOVENOX) injection 40 mg  40 mg Subcutaneous O03O Leighton Ruff, MD   40 mg at 08/07/16 1224  . MEDLINE mouth rinse  15 mL Mouth Rinse BID Leighton Ruff, MD   15 mL at 08/05/16 2109  . methocarbamol (ROBAXIN) 1,000 mg in dextrose 5 % 50 mL IVPB  1,000 mg Intravenous Q6H PRN Michael Boston, MD      . methocarbamol (ROBAXIN) tablet 1,000 mg  1,000 mg Oral Q6H PRN Michael Boston, MD      . morphine 2 MG/ML injection 2-4 mg  2-4 mg Intravenous M2N PRN Leighton Ruff, MD   4 mg at 08/06/16 2139  . ondansetron (ZOFRAN) tablet 4 mg  4 mg Oral O0B PRN Leighton Ruff, MD       Or  . ondansetron [facility]) injection 4 mg  4  mg Intravenous M7E PRN Leighton Ruff, MD      . pantoprazole (PROTONIX) EC tablet 40 mg  40 mg Oral Daily Leighton Ruff, MD   40 mg at 08/06/16 1021  . sodium chloride flush (NS) 0.9 % injection 3 mL  3 mL Intravenous H20N Leighton Ruff, MD   3 mL at 08/06/16 2141  . sodium chloride flush (NS) 0.9 % injection 3 mL  3 mL Intravenous PRN Leighton Ruff, MD      . sodium chloride flush (NS) 0.9 % injection 3 mL  3 mL Intravenous Q12H Michael Boston, MD      . sodium chloride flush (NS) 0.9 % injection 3 mL  3 mL Intravenous PRN Michael Boston, MD      . traMADol Veatrice Bourbon) tablet 50-100 mg  50-100 mg Oral Q6H PRN Michael Boston, MD         No Known Allergies  Disposition: 01-Home or Self Care  Discharge Instructions    Call MD for:    Complete by:  As directed    Temperature > 101.64F   Call MD for:  extreme fatigue    Complete by:  As  directed    Call MD for:  hives    Complete by:  As directed    Call MD for:  persistant nausea and vomiting    Complete by:  As directed    Call MD for:  redness, tenderness, or signs of infection (pain, swelling, redness, odor or green/yellow discharge around incision site)    Complete by:  As directed    Call MD for:  severe uncontrolled pain    Complete by:  As directed    Diet - low sodium heart healthy    Complete by:  As directed    Start with bland, low residue diet for a few days, then advance to a heart healthy (low fat, high fiber) diet.  If you feel nauseated or constipated, simplify to a liquid only diet for 48 hours until you are feeling better (no more nausea, farting/passing gas, having a bowel movement, etc...).  If you cannot tolerate even drinking liquids, or feeling worse, let your surgeon know or go to the Emergency Department for help.   Discharge instructions    Complete by:  As directed    Please see discharge instruction sheets.   Also refer to any handouts/printouts that may have been given from the [facility] surgery office (if you visited [place] there before surgery) Please call our office if you have any questions or concerns [phone]   Discharge wound care:    Complete by:  As directed    If you have closed incisions: Shower and bathe over these incisions with soap and water every day.  It is OK to wash over the dressings: they are waterproof. Remove all surgical dressings on postoperative day #3.  You do not need to replace dressings over the closed incisions unless you feel more comfortable with a Band-Aid covering it.   Please call our office [phone] if you have further questions.   Driving Restrictions    Complete by:  As directed    No driving until off narcotics and can safely swerve away without pain during an emergency   Increase activity slowly    Complete by:  As directed    Lifting restrictions    Complete by:  As directed    Avoid heavy  lifting initially, <20 pounds at first.   Do not push through pain.  You have no specific weight limit: If it hurts to do, DON'T DO IT.    If you feel no pain, you are not injuring anything.  Pain will protect you from injury.   Coughing and sneezing are far more stressful to your incision than any lifting.   Avoid resuming heavy lifting (>50 pounds) or other intense activity until off all narcotic pain medications.   When want to exercise more, give yourself 2 weeks to gradually get back to full intense exercise/activity.   May shower / Bathe    Complete by:  As directed    Star.  It is fine for dressings or wounds to be washed/rinsed.  Use gentle soap & water.  This will help the incisions and/or wounds get clean & minimize infection.   May walk up steps    Complete by:  As directed    Sexual Activity Restrictions    Complete by:  As directed    Sexual activity as tolerated.  Do not push through pain.  Pain will protect you from injury.   Walk with assistance    Complete by:  As directed    Walk over an hour a day.  May use a walker/cane/companion to help with balance and stamina.       Medication List    STOP taking these medications   capecitabine 150 MG tablet Commonly known as:  XELODA   capecitabine 500 MG tablet Commonly known as:  XELODA   docusate sodium 100 MG capsule Commonly known as:  COLACE     TAKE these medications   aspirin 81 MG tablet Take 81 mg by mouth every morning.   dexlansoprazole 60 MG capsule Commonly known as:  DEXILANT Take 60 mg by mouth every morning.   ferrous sulfate 325 (65 FE) MG tablet Take 325 mg by mouth daily with breakfast.   multivitamin tablet Take 1 tablet by mouth every morning.   ondansetron 8 MG tablet Commonly known as:  ZOFRAN Take 1 tablet (8 mg total) by mouth every 8 (eight) hours as needed for nausea.   prochlorperazine 10 MG tablet Commonly known as:  COMPAZINE Take 1 tablet (10 mg total) by mouth  every 6 (six) hours as needed for nausea or vomiting.   traMADol 50 MG tablet Commonly known as:  ULTRAM Take 1-2 tablets (50-100 mg total) by mouth every 6 (six) hours as needed for moderate pain or severe pain.   VITAMIN D PO Take 1 tablet by mouth daily.      Follow-up Information    Rosario Adie., MD. Schedule an appointment as soon as possible for a visit in 2 week(s).   Specialty:  General Surgery Contact information: [address] [place] [address] [phone]            Signed: Morton Peters, M.D., F.A.C.S. Gastrointestinal and Minimally Invasive Surgery [facility] [address] [place], [place] [address] [phone] Main / Paging   08/07/2016, 9:59 AM

## 2016-08-07 NOTE — Progress Notes (Signed)
Discharge teaching completed. Handouts given and reviewed with pt. and spouse. Pt. Instructed and demonstrated JP drain care. Pt. understands to record output daily. Prescription given for Ultram. Pt. to call Monday for appointment.

## 2016-08-26 ENCOUNTER — Telehealth: Payer: Self-pay | Admitting: Hematology

## 2016-08-26 ENCOUNTER — Other Ambulatory Visit (HOSPITAL_BASED_OUTPATIENT_CLINIC_OR_DEPARTMENT_OTHER): Payer: BLUE CROSS/BLUE SHIELD

## 2016-08-26 ENCOUNTER — Ambulatory Visit (HOSPITAL_BASED_OUTPATIENT_CLINIC_OR_DEPARTMENT_OTHER): Payer: BLUE CROSS/BLUE SHIELD | Admitting: Hematology

## 2016-08-26 VITALS — BP 157/76 | HR 62 | Temp 98.0°F | Resp 17 | Ht 76.0 in | Wt 202.7 lb

## 2016-08-26 DIAGNOSIS — C20 Malignant neoplasm of rectum: Secondary | ICD-10-CM | POA: Diagnosis not present

## 2016-08-26 DIAGNOSIS — D509 Iron deficiency anemia, unspecified: Secondary | ICD-10-CM | POA: Diagnosis not present

## 2016-08-26 LAB — CBC WITH DIFFERENTIAL/PLATELET
BASO%: 0.3 % (ref 0.0–2.0)
Basophils Absolute: 0 10*3/uL (ref 0.0–0.1)
EOS%: 2.6 % (ref 0.0–7.0)
Eosinophils Absolute: 0.1 10*3/uL (ref 0.0–0.5)
HEMATOCRIT: 35.7 % — AB (ref 38.4–49.9)
HEMOGLOBIN: 12.7 g/dL — AB (ref 13.0–17.1)
LYMPH#: 0.7 10*3/uL — AB (ref 0.9–3.3)
LYMPH%: 18.3 % (ref 14.0–49.0)
MCH: 30.5 pg (ref 27.2–33.4)
MCHC: 35.6 g/dL (ref 32.0–36.0)
MCV: 85.8 fL (ref 79.3–98.0)
MONO#: 0.5 10*3/uL (ref 0.1–0.9)
MONO%: 11.6 % (ref 0.0–14.0)
NEUT#: 2.6 10*3/uL (ref 1.5–6.5)
NEUT%: 67.2 % (ref 39.0–75.0)
Platelets: 182 10*3/uL (ref 140–400)
RBC: 4.16 10*6/uL — ABNORMAL LOW (ref 4.20–5.82)
RDW: 12.5 % (ref 11.0–14.6)
WBC: 3.9 10*3/uL — AB (ref 4.0–10.3)

## 2016-08-26 LAB — COMPREHENSIVE METABOLIC PANEL
ALBUMIN: 3.6 g/dL (ref 3.5–5.0)
ALK PHOS: 95 U/L (ref 40–150)
ALT: 11 U/L (ref 0–55)
AST: 16 U/L (ref 5–34)
Anion Gap: 9 mEq/L (ref 3–11)
BUN: 14.6 mg/dL (ref 7.0–26.0)
CALCIUM: 9.2 mg/dL (ref 8.4–10.4)
CHLORIDE: 107 meq/L (ref 98–109)
CO2: 23 mEq/L (ref 22–29)
CREATININE: 1 mg/dL (ref 0.7–1.3)
EGFR: 81 mL/min/{1.73_m2} — ABNORMAL LOW (ref >90)
GLUCOSE: 104 mg/dL (ref 70–140)
Potassium: 4 mEq/L (ref 3.5–5.1)
SODIUM: 139 meq/L (ref 136–145)
Total Bilirubin: 0.82 mg/dL (ref 0.20–1.20)
Total Protein: 6.8 g/dL (ref 6.4–8.3)

## 2016-08-26 LAB — IRON AND TIBC
%SAT: 43 % (ref 20–55)
Iron: 111 ug/dL (ref 42–163)
TIBC: 259 ug/dL (ref 202–409)
UIBC: 147 ug/dL (ref 117–376)

## 2016-08-26 LAB — CEA (IN HOUSE-CHCC): CEA (CHCC-In House): 1.12 ng/mL (ref 0.00–5.00)

## 2016-08-26 LAB — FERRITIN: FERRITIN: 39 ng/mL (ref 22–316)

## 2016-08-26 MED ORDER — TRAMADOL HCL 50 MG PO TABS: 50.0000 mg | ORAL_TABLET | Freq: Four times a day (QID) | ORAL | 0 refills | Status: DC | PRN

## 2016-08-26 NOTE — Progress Notes (Signed)
[place]  [phone] [phone]  Clinic Follow Up Note   Patient Care Team: Maury Dus, MD as PCP - General (Family Medicine) Ladene Artist, MD as Consulting Physician (Gastroenterology) Leighton Ruff, MD as Consulting Physician (General Surgery) Truitt Merle, MD as Consulting Physician (Hematology) Kyung Rudd, MD as Consulting Physician (Radiation Oncology) Tania Ade, RN as Registered Nurse Michael Boston, MD as Consulting Physician (General Surgery) 08/26/2016   CHIEF COMPLAINTS:  Follow up proximal rectal cancer  Oncology History   Rectal cancer [facility])   Staging form: Colon and Rectum, AJCC 7th Edition     Clinical stage from 03/19/2016: Stage IIIB (T3, N1, M0) - Signed by Truitt Merle, MD on 04/02/2016       Rectal cancer ypT2ypN1cM0 s/p robotic LAR 08/04/2016   03/19/2016 Initial Diagnosis    Rectal cancer ([place])      03/19/2016 Initial Biopsy    Rectal mass biopsy showed invasive adenocarcinoma. Sigmoid colon polyps showed tubular adenoma.       03/19/2016 Procedure    Colonoscopy showed a fungating partially obstructing mass in the proximal rectum, partially circumferential involving two thirds of the lumen circumference, measuring 6 x 4 cm, 10-16 cm from the anal verge. A 10 mm polyps was found in the sigmoid colon.      03/22/2016 Tumor Marker    CEA=1.1      03/23/2016 Imaging    CT chest, abdomen and pelvis with contrast showed asymmetric rectal mass approximately 10 cm from the anus, 2 suspicious lymph nodes in the deep pelvis concerning for local nodal metastasis. No other evidence of metastatic disease.      03/25/2016 Procedure    EUS showed a T3 proximal rectal mass, there was one small 6 mm suspicious for rectal node that was suspicious for malignancy (uN1)      04/14/2016 - 05/24/2016 Chemotherapy    Xeloda 1800 mg every 12 hours, Monday through Friday, with concurrent radiation      04/14/2016 - 05/24/2016 Radiation  Therapy     Adjuvant irradiation to his rectal cancer.      08/04/2016 Surgery    Robotic-assisted low anterior resection of rectal cancer.      08/04/2016 Pathology Results    Rectosigmoid segmental resection showed adenocarcinoma of the rectum, grade 2, tumor invades muscularis propria, margins were negative. Metastatic adenocarcinoma was seen treatment effect in 2 lymph nodes, a total of 4 nodes.         HISTORY OF PRESENTING ILLNESS (04/02/2016):  Jermaine Smith 65 y.o. male is here because of his recently diagnosed rectal adenocarcinoma. He is accompanied by his wife to our multidisciplinary GI clinic today.  He has been having rectal bleeding for the past 4 months, intermittent, small amount, mixed with stool, BM is regular, but smaller caliber, no rectal pain, nausea, bloating, he has lost 5 lbs during his recent colonoscopy procedures, his appetite is normal, has been eating less, since the diagnosis of cancer. His appetite and energy level are normal. He saw gastroenterologist Dr. Fuller Plan in the past for his GERD, and self-referred back to Dr. Fuller Plan and underwent colonoscopy in early May 2017. The colonoscopy showed a polyp in sigmoid colon, and a fungating mass in the proximal rectum, biopsy showed adenocarcinoma. He underwent EUS Colonoscopy, which showed a T3 N1 disease. CT scan was negative for distant metastasis.  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 He has been very healthy,  only takes PPI for acid reflux and baby aspirin. Exercise regularly. He feels well well overall.  CURRENT THERAPY: recovering from surgery   INTERIM HISTORY: [name] returns for follow up. He underwent robotic-assisted low anterior resection of his rectal cancer by Dr. Marcello Moores. He tolerates his surgery very well, and has been recovering well. The pain at the incision site has a soft, his appetite and energy level has recovered to 75% of his baseline. He has been back to  work, tolerates well. His bowel movement is normal.  MEDICAL HISTORY:  Past Medical History:  Diagnosis Date  . FHx: brain aneurysm   . GERD (gastroesophageal reflux disease)   . Hiatal hernia   . Left inguinal hernia   . Skin cancer    hx basal cell carcinoma    SURGICAL HISTORY: Past Surgical History:  Procedure Laterality Date  . CLAVICLE SURGERY    . EUS N/A 03/25/2016   Procedure: LOWER ENDOSCOPIC ULTRASOUND (EUS);  Surgeon: Milus Banister, MD;  Location: [facility];  Service: Endoscopy;  Laterality: N/A;  . INGUINAL HERNIA REPAIR  1961   open right inguinal age 30 y/o  . lap bilateral ing. hernia repair Bilateral 05/09/12  . XI ROBOTIC ASSISTED LOWER ANTERIOR RESECTION N/A 08/04/2016   Procedure: XI ROBOTIC ASSISTED LOWER ANTERIOR RESECTION WITH RIGID PROCTOSCOPY;  Surgeon: Leighton Ruff, MD;  Location: [facility];  Service: General;  Laterality: N/A;    SOCIAL HISTORY: Social History   Social History  . Marital status: Married    Spouse name: N/A  . Number of children: 2  . Years of education: N/A   Occupational History  . endoscope specialist [employer]    Works on [employer]  . Smoking status: Former Smoker    Packs/day: 0.50    Years: 5.00    Types: Cigarettes    Quit date: 09/03/1978  . Smokeless tobacco: Never Used  . Alcohol use 0.0 oz/week     Comment: ocassional, once a month   . Drug use: No     Comment: still smoking 1ppd,had quit in 1978,   . Sexual activity: Not on file   Other Topics Concern  . 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   FAMILY HISTORY: Family History  Problem Relation Age of Onset  . Heart disease Father   . Clotting disorder Mother     PE  . Hypotension Mother   . Colon cancer Neg Hx                               He has two adopted children, 47 and 69 yo  ALLERGIES:  has No Known Allergies.  MEDICATIONS:  Current Outpatient Prescriptions  Medication Sig Dispense Refill  . aspirin 81 MG tablet Take 81 mg by mouth every morning.     . Cholecalciferol (VITAMIN D PO) Take 1 tablet by mouth daily.    Marland Kitchen dexlansoprazole (DEXILANT) 60 MG capsule Take 60 mg by mouth every morning.     . ferrous sulfate 325 (65 FE) MG tablet Take 325 mg by mouth daily with breakfast.    . Multiple Vitamin (MULTIVITAMIN) tablet Take 1 tablet by mouth every morning.     . ondansetron (  ZOFRAN) 8 MG tablet Take 1 tablet (8 mg total) by mouth every 8 (eight) hours as needed for nausea. (Patient not taking: Reported on 07/26/2016) 30 tablet 2  . prochlorperazine (COMPAZINE) 10 MG tablet Take 1 tablet (10 mg total) by mouth every 6 (six) hours as needed for nausea or vomiting. (Patient not taking: Reported on 06/29/2016) 30 tablet 1  . traMADol (ULTRAM) 50 MG tablet Take 1-2 tablets (50-100 mg total) by mouth every 6 (six) hours as needed for moderate pain or severe pain. 30 tablet 0   No current facility-administered medications for this visit.     REVIEW OF SYSTEMS:   Constitutional: Denies fevers, chills or abnormal night sweats Eyes: Denies blurriness of vision, double vision or watery eyes Ears, nose, mouth, throat, and face: Denies mucositis or sore throat Respiratory: Denies cough, dyspnea or wheezes Cardiovascular: Denies palpitation, chest discomfort or lower extremity swelling Gastrointestinal:  Denies nausea, heartburn or change in bowel habits Skin: Denies abnormal skin rashes Lymphatics: Denies new lymphadenopathy or easy bruising Neurological:Denies numbness, tingling or new weaknesses Behavioral/Psych: Mood is stable, no new changes  All other systems were reviewed with the patient and are negative.  PHYSICAL EXAMINATION: ECOG PERFORMANCE STATUS: 1  Vitals:   08/26/16 1317  BP: (!) 157/76    Pulse: 62  Resp: 17  Temp: 98 F (36.7 C)   Filed Weights   08/26/16 1317  Weight: 202 lb 11.2 oz (91.9 kg)    GENERAL:alert, no distress and comfortable SKIN: skin color, texture, turgor are normal, no rashes or significant lesions except some scatter macular rash at the upper inner thighs.  EYES: normal, conjunctiva are pink and non-injected, sclera clear OROPHARYNX:no exudate, no erythema and lips, buccal mucosa, and tongue normal  NECK: supple, thyroid normal size, non-tender, without nodularity LYMPH:  no palpable lymphadenopathy in the cervical, axillary or inguinal LUNGS: clear to auscultation and percussion with normal breathing effort HEART: regular rate & rhythm and no murmurs and no lower extremity edema ABDOMEN:abdomen soft, non-tender and normal bowel sounds, No organomegaly. Rectal exam was not performed. Musculoskeletal:no cyanosis of digits and no clubbing  PSYCH: alert & oriented x 3 with fluent speech NEURO: no focal motor/sensory deficits  LABORATORY DATA:  I have reviewed the data as listed CBC Latest Ref Rng & Units 08/26/2016 08/07/2016 08/06/2016  WBC 4.0 - 10.3 10e3/uL 3.9(L) 4.6 5.1  Hemoglobin 13.0 - 17.1 g/dL 12.7(L) 12.8(L) 12.1(L)  Hematocrit 38.4 - 49.9 % 35.7(L) 36.4(L) 35.4(L)  Platelets 140 - 400 10e3/uL 182 166 163    CMP Latest Ref Rng & Units 08/26/2016 08/07/2016 08/06/2016  Glucose 70 - 140 mg/dl 104 100(H) 96  BUN 7.0 - 26.0 mg/dL 14.6 21(H) 21(H)  Creatinine 0.7 - 1.3 mg/dL 1.0 1.00 1.20  Sodium 136 - 145 mEq/L 139 138 138  Potassium 3.5 - 5.1 mEq/L 4.0 4.2 4.2  Chloride 101 - 111 mmol/L - 107 107  CO2 22 - 29 mEq/L _0 Calcium 8.4 - 10.4 mg/dL 9.2 9.0 8.6(L)  Total Protein 6.4 - 8.3 g/dL 6.8 - -  Total Bilirubin 0.20 - 1.20 mg/dL 0.82 - -  Alkaline Phos 40 - 150 U/L 95 - -  AST 5 - 34 U/L 16 - -  ALT 0 - 55 U/L 11 - -    CEA (ng/ml) 03/19/2016: 1.1 04/15/2016: 1.9   Diagnosis 03/19/2016 1. Colon, polyp(s), sigmoid - TUBULAR  ADENOMA(S). - HIGH GRADE DYSPLASIA IS NOT IDENTIFIED. 2. Rectum, biopsy, mass -  INVASIVE ADENOCARCINOMA.  Diagnosis 08/04/2016 1. Colon, segmental resection for tumor, rectosigmoid ADENOCARCINOMA OF THE RECTUM, GRADE 2, THE TUMOR INVADES MUSCULARIS PROPRIA ALL MARGINS OF RESECTION ARE NEGATIVE FOR CARCINOMA METASTATIC ADENOCARCINOMA WITH TREATMENT EFFECT IN TWO LYMPH NODES (2/4) 2. Colon, resection margin (donut), final distal BENIGN COLONIC TISSUE Microscopic Comment 1. COLON AND RECTUM (INCLUDING TRANS-ANAL RESECTION): Specimen: Rectosigmoid colon Procedure: Segmental resection Tumor site: Rectum Specimen integrity: Intact Macroscopic intactness of mesorectum: Not applicable: NA Complete: x Near complete: NA Incomplete: NA Cannot be determined (specify): NA Macroscopic tumor perforation: Muscularis propria Invasive tumor: Maximum size: 2.1 cm Histologic type(s): Adenocarcinoma Histologic grade and differentiation: G1: well differentiated/low grade G2: moderately differentiated/low grade G3: poorly differentiated/high grade G4: undifferentiated/high grade Type of polyp in which invasive carcinoma arose: Tubular adenoma Microscopic extension of invasive tumor: Muscularis propria Lymph-Vascular invasion: Negative Peri-neural invasion: Negative Tumor deposit(s) (discontinuous extramural extension): Negative Resection margins: Proximal margin: Negative Distal margin: Negative Circumferential (radial) (posterior ascending, posterior descending; lateral and posterior mid-rectum; and entire lower 1/3 rectum):Negative Mesenteric margin (sigmoid and transverse): Negative Distance closest margin (if all above margins negative): 3.2 cm Trans-anal resection margins only: Deep margin: NA Mucosal Margin: NA Distance closest mucosal margin (if negative): NA Treatment effect (neo-adjuvant therapy): Partial Additional polyp(s): Negative Non-neoplastic findings: Unremarkable Lymph  nodes: number examined 4; number positive: 2 Pathologic Staging: ypT2, ypN1, Mx Ancillary studies: Per request   RADIOGRAPHIC STUDIES: I have personally reviewed the radiological images as listed and agreed with the findings in the report. No results found.   EUS by Dr. Ardis Hughs 03/25/2016 Endosonographic Finding 1. The mass above correlated with a hyoechoic mass that clearly invades into and through the muscularis propria layer of the rectal wall (uT3). 2. The was one small (29m) suspicious perirectal lymphnode that was suspicious for malignant involvement (uN1) 3. The right obturator lymphnode that was described on recent CT scan was not visible on this exam 5cm long, non-circumferential uT3N1 (stage IIIb) proximal rectal adenocarcinoma with distal edge located 9-10cm from the anal verge. Following EUS staging, the mass was labeled with submucosal injection of SPOT.   COLONOSCOPY by Dr. SFuller Plan5/03/2016 Findings:  The digital rectal exam was normal. - A fungating partially obstructing mass was found in the proximal rectum. The mass was partially circumferential (involving two-thirds of the lumen circumference). The mass measured six cm in length by 4 cm in width. It extended from 10-16 cm from the anal verge. Oozing was present. This was biopsied with a cold forceps for histology. - A 10 mm polyp was found in the sigmoid colon. The polyp was semi-pedunculated. The polyp was removed with a hot snare. Resection and retrieval were complete. - Internal hemorrhoids were found during retroflexion. The hemorrhoids were small and Grade I (internal hemorrhoids that do not prolapse). - The exam was otherwise normal throughout the examined colon.  IMPRESSION: - Malignant partially obstructing tumor in the proximal rectum. Biopsied. - One 10 mm polyp in the sigmoid colon, removed with a hot snare. Resected and retrieved. - Internal hemorrhoids.   ASSESSMENT & PLAN: 65year old occasion male,  presented with mild intermittent rectal bleeding for 4 months.  1. Proximal rectal adenocarcinoma, uT3N1M0, stag IIIB, ypT2N1M0,  MMR unknown  -I discussed his CT scan finding, colonoscopy and biopsy results in great details with patient and his wife. -Staging results was discussed with him also, the risk of cancer recurrence for stage III rectal cancer is high, the standard care is neoadjuvant chemotherapy and radiation, followed by surgery and  adjuvant chemotherapy. This was discussed with him in great details -He has completed neoadjuvant chemoradiation with Xeloda, tolerated well overall. -I reviewed his surgical pathology findings. He had a partial response to neoadjuvant chemoradiation, still has significant residual disease, especially with 2 positive lymph nodes (out of 4) -I spoke with pathologist Dr. Orene Desanctis, she exam to the surgical sample again, there are only total of 4 lymph nodes on the surgical sample. -We discussed the role of adjuvant chemotherapy after surgery. Giving the significant residual disease, especially positive lymph nodes, I recommend him to have adjuvant chemotherapy with FOLFOX or CAPOX. He opted CAPOX, for a total of 4 cycles.  --Chemotherapy consent: Side effects including but does not not limited to, fatigue, nausea, vomiting, diarrhea, hair loss, neuropathy, fluid retention, renal and kidney dysfunction, neutropenic fever, needed for blood transfusion, bleeding, coronary artery spasm and heart attack, were discussed with patient in great detail. He agrees to proceed. -He has recovered well from surgery, we'll plan to start chemotherapy in 2 weeks  2. Iron deficient anemia from rectal bleeding. -He had mild anemia with hemoglobin 11.9 when he was diagnosed with rectal cancer. -His iron study showed low ferritin  and transferrin saturation, consistent with iron deficiency. This is from his rectal bleeding. -He has started ferrous sulfate twice daily, I encouraged him to  take the pill with orange juice or vitamin C after meal, he is tolerating well. He is on once daily -His anemia is resolved    Plan -first cycle CAPOX in 2 weeks, I will see him before chemo   All questions were answered. The patient knows to call the clinic with any problems, questions or concerns. I spent 25 minutes counseling the patient face to face. The total time spent in the appointment was 30 minutes and more than 50% was on counseling.    Truitt Merle, MD 08/26/2016

## 2016-08-26 NOTE — Progress Notes (Signed)
START ON PATHWAY REGIMEN - Colorectal  ROS57: CapeOx q21 Days x 6 Cycles   A cycle is every 21 days:     Capecitabine (Xeloda(R)) 850 mg/m2 orally twice a day days 1 through 14 followed by 7 days off Dose Mod: None     Oxaliplatin (Eloxatin(R)) 130 mg/m2 in 250 mL D5W IV over 2 hours day 1 every 21 days Dose Mod: None  **Always confirm dose/schedule in your pharmacy ordering system**    Patient Characteristics: Rectal Neoadjuvant/Adjuvant, T3 - T4, N0 or Any T, N+***, Postoperative - Had Neoadjuvant Therapy - Preoperative Node Positive AJCC T Stage: 2 AJCC N Stage: 1 AJCC Stage Grouping: IIIA AJCC M Stage: 0 Current evidence of distant metastases? No  Intent of Therapy: Curative Intent, Discussed with Patient

## 2016-08-26 NOTE — Telephone Encounter (Signed)
Gave patient avs report and appointments for October  °

## 2016-08-27 LAB — CEA: CEA1: 1.2 ng/mL (ref 0.0–4.7)

## 2016-08-29 ENCOUNTER — Encounter: Payer: Self-pay | Admitting: Hematology

## 2016-08-29 MED ORDER — CAPECITABINE 500 MG PO TABS: 850.0000 mg/m2 | ORAL_TABLET | Freq: Two times a day (BID) | ORAL | 1 refills | Status: DC

## 2016-08-30 ENCOUNTER — Other Ambulatory Visit (HOSPITAL_COMMUNITY)
Admission: RE | Admit: 2016-08-30 | Discharge: 2016-08-30 | Disposition: A | Discharge status: Home / Self Care | Payer: BLUE CROSS/BLUE SHIELD | Source: Ambulatory Visit | Attending: Hematology | Admitting: Hematology

## 2016-08-30 DIAGNOSIS — C20 Malignant neoplasm of rectum: Secondary | ICD-10-CM | POA: Diagnosis not present

## 2016-08-31 ENCOUNTER — Other Ambulatory Visit: Payer: Self-pay | Admitting: *Deleted

## 2016-08-31 ENCOUNTER — Telehealth: Payer: Self-pay | Admitting: *Deleted

## 2016-08-31 DIAGNOSIS — C20 Malignant neoplasm of rectum: Secondary | ICD-10-CM

## 2016-08-31 MED ORDER — CAPECITABINE 500 MG PO TABS: 850.0000 mg/m2 | ORAL_TABLET | Freq: Two times a day (BID) | ORAL | 1 refills | Status: DC

## 2016-08-31 NOTE — Telephone Encounter (Signed)
Faxed new script for Xeloda to [facility]     Phone     [phone]      ;      Fax       [phone].

## 2016-09-06 ENCOUNTER — Telehealth: Payer: Self-pay | Admitting: *Deleted

## 2016-09-06 NOTE — Telephone Encounter (Signed)
Received call from pt's wife asking about script of xeloda.  Script had been faxed on 10/17.  Called [facility] & they hadn't received.  Verbal order given. Notified pt's wife.

## 2016-09-06 NOTE — Telephone Encounter (Signed)
TC from patient's wife inquiring about pt's Xeloda.  He is to start this on Thursday and they have not received this yet in the mail. Please advise.

## 2016-09-09 ENCOUNTER — Encounter: Payer: Self-pay | Admitting: *Deleted

## 2016-09-09 ENCOUNTER — Other Ambulatory Visit (HOSPITAL_BASED_OUTPATIENT_CLINIC_OR_DEPARTMENT_OTHER): Payer: BLUE CROSS/BLUE SHIELD

## 2016-09-09 ENCOUNTER — Ambulatory Visit (HOSPITAL_BASED_OUTPATIENT_CLINIC_OR_DEPARTMENT_OTHER): Payer: BLUE CROSS/BLUE SHIELD

## 2016-09-09 ENCOUNTER — Telehealth: Payer: Self-pay | Admitting: Nurse Practitioner

## 2016-09-09 ENCOUNTER — Ambulatory Visit (HOSPITAL_BASED_OUTPATIENT_CLINIC_OR_DEPARTMENT_OTHER): Payer: BLUE CROSS/BLUE SHIELD | Admitting: Nurse Practitioner

## 2016-09-09 DIAGNOSIS — Z5111 Encounter for antineoplastic chemotherapy: Secondary | ICD-10-CM | POA: Diagnosis not present

## 2016-09-09 DIAGNOSIS — C20 Malignant neoplasm of rectum: Secondary | ICD-10-CM | POA: Diagnosis not present

## 2016-09-09 LAB — CBC WITH DIFFERENTIAL/PLATELET
BASO%: 0.6 % (ref 0.0–2.0)
BASOS ABS: 0 10*3/uL (ref 0.0–0.1)
EOS ABS: 0.1 10*3/uL (ref 0.0–0.5)
EOS%: 2.3 % (ref 0.0–7.0)
HCT: 36.1 % — ABNORMAL LOW (ref 38.4–49.9)
HGB: 13 g/dL (ref 13.0–17.1)
LYMPH%: 18 % (ref 14.0–49.0)
MCH: 30.2 pg (ref 27.2–33.4)
MCHC: 36 g/dL (ref 32.0–36.0)
MCV: 84 fL (ref 79.3–98.0)
MONO#: 0.4 10*3/uL (ref 0.1–0.9)
MONO%: 11.6 % (ref 0.0–14.0)
NEUT#: 2.3 10*3/uL (ref 1.5–6.5)
NEUT%: 67.5 % (ref 39.0–75.0)
Platelets: 145 10*3/uL (ref 140–400)
RBC: 4.3 10*6/uL (ref 4.20–5.82)
RDW: 12.5 % (ref 11.0–14.6)
WBC: 3.5 10*3/uL — ABNORMAL LOW (ref 4.0–10.3)
lymph#: 0.6 10*3/uL — ABNORMAL LOW (ref 0.9–3.3)

## 2016-09-09 LAB — COMPREHENSIVE METABOLIC PANEL
ALBUMIN: 3.5 g/dL (ref 3.5–5.0)
ALK PHOS: 91 U/L (ref 40–150)
ALT: 16 U/L (ref 0–55)
ANION GAP: 8 meq/L (ref 3–11)
AST: 18 U/L (ref 5–34)
BILIRUBIN TOTAL: 0.87 mg/dL (ref 0.20–1.20)
BUN: 13.3 mg/dL (ref 7.0–26.0)
CO2: 23 meq/L (ref 22–29)
Calcium: 9.2 mg/dL (ref 8.4–10.4)
Chloride: 109 mEq/L (ref 98–109)
Creatinine: 1 mg/dL (ref 0.7–1.3)
EGFR: 82 mL/min/{1.73_m2} — AB (ref >90)
GLUCOSE: 119 mg/dL (ref 70–140)
POTASSIUM: 3.9 meq/L (ref 3.5–5.1)
SODIUM: 140 meq/L (ref 136–145)
TOTAL PROTEIN: 6.8 g/dL (ref 6.4–8.3)

## 2016-09-09 MED ORDER — ONDANSETRON HCL 8 MG PO TABS: 8.0000 mg | ORAL_TABLET | Freq: Three times a day (TID) | ORAL | 2 refills | Status: DC | PRN

## 2016-09-09 MED ORDER — DEXAMETHASONE SODIUM PHOSPHATE 10 MG/ML IJ SOLN: 10.0000 mg | Freq: Once | INTRAMUSCULAR | Status: AC

## 2016-09-09 MED ORDER — DEXAMETHASONE SODIUM PHOSPHATE 10 MG/ML IJ SOLN
INTRAMUSCULAR | Status: AC
  Filled 2016-09-09: qty 1

## 2016-09-09 MED ORDER — DEXTROSE 5 % IV SOLN
130.0000 mg/m2 | Freq: Once | INTRAVENOUS | Status: AC
  Administered 2016-09-09: 290 mg via INTRAVENOUS
  Filled 2016-09-09: qty 58

## 2016-09-09 MED ORDER — SODIUM CHLORIDE 0.9 % IV SOLN
Freq: Once | INTRAVENOUS | Status: AC
  Administered 2016-09-09: 14:00:00 via INTRAVENOUS
  Filled 2016-09-09: qty 8

## 2016-09-09 MED ORDER — SODIUM CHLORIDE 0.9 % IV SOLN
Freq: Once | INTRAVENOUS | Status: DC
Start: 2016-09-09 — End: 2016-09-09

## 2016-09-09 MED ORDER — PALONOSETRON HCL INJECTION 0.25 MG/5ML: 0.2500 mg | Freq: Once | INTRAVENOUS | Status: DC

## 2016-09-09 MED ORDER — PALONOSETRON HCL INJECTION 0.25 MG/5ML
INTRAVENOUS | Status: AC
  Filled 2016-09-09: qty 5

## 2016-09-09 MED ORDER — DEXTROSE 5 % IV SOLN
Freq: Once | INTRAVENOUS | Status: AC
  Administered 2016-09-09: 14:00:00 via INTRAVENOUS

## 2016-09-09 NOTE — Progress Notes (Signed)
[place] OFFICE PROGRESS NOTE   Diagnosis:  Follow-up proximal rectal cancer Oncology History   Rectal cancer [facility])   Staging form: Colon and Rectum, AJCC 7th Edition     Clinical stage from 03/19/2016: Stage IIIB (T3, N1, M0) - Signed by Truitt Merle, MD on 04/02/2016      Rectal cancer ypT2ypN1cM0 s/p robotic LAR 08/04/2016   03/19/2016 Initial Diagnosis    Rectal cancer ([place])     03/19/2016 Initial Biopsy    Rectal mass biopsy showed invasive adenocarcinoma. Sigmoid colon polyps showed tubular adenoma.      03/19/2016 Procedure    Colonoscopy showed a fungating partially obstructing mass in the proximal rectum, partially circumferential involving two thirds of the lumen circumference, measuring 6 x 4 cm, 10-16 cm from the anal verge. A 10 mm polyps was found in the sigmoid colon.     03/22/2016 Tumor Marker    CEA=1.1     03/23/2016 Imaging    CT chest, abdomen and pelvis with contrast showed asymmetric rectal mass approximately 10 cm from the anus, 2 suspicious lymph nodes in the deep pelvis concerning for local nodal metastasis. No other evidence of metastatic disease.     03/25/2016 Procedure    EUS showed a T3 proximal rectal mass, there was one small 6 mm suspicious for rectal node that was suspicious for malignancy (uN1)     04/14/2016 - 05/24/2016 Chemotherapy    Xeloda 1800 mg every 12 hours, Monday through Friday, with concurrent radiation     04/14/2016 - 05/24/2016 Radiation Therapy     Adjuvant irradiation to his rectal cancer.     08/04/2016 Surgery    Robotic-assisted low anterior resection of rectal cancer.     08/04/2016 Pathology Results    Rectosigmoid segmental resection showed adenocarcinoma of the rectum, grade 2, tumor invades muscularis propria, margins were negative. Metastatic adenocarcinoma was seen treatment effect in 2 lymph nodes, a total of 4 nodes.     INTERVAL HISTORY:   Jermaine Smith returns as  scheduled. Overall feeling well. He has a good appetite. Bowels moving. No nausea or vomiting. No abdominal pain. He took the first dose of Xeloda this morning.  Objective:  Vital signs in last 24 hours:  Blood pressure (!) 146/86, pulse 70, temperature 98 F (36.7 C), temperature source Oral, resp. rate 18, height 6' 4"  (1.93 m), weight 203 lb 4.8 oz (92.2 kg), SpO2 100 %.    HEENT: No thrush or ulcers. Resp: Lungs clear bilaterally. Cardio: Regular rate and rhythm. GI: Abdomen soft and nontender. No hepatomegaly. Healed surgical incisions. Vascular: No leg edema. Calves soft and nontender. Skin: No rash.    Lab Results:  Lab Results  Component Value Date   WBC 3.5 (L) 09/09/2016   HGB 13.0 09/09/2016   HCT 36.1 (L) 09/09/2016   MCV 84.0 09/09/2016   PLT 145 09/09/2016   NEUTROABS 2.3 09/09/2016    Imaging:  No results found.  Medications: I have reviewed the patient's current medications.  Assessment/Plan: 1. Proximal rectal adenocarcinoma, uT3N1M0, stage IIIB, ypT2N1M0, MMR unknown; status post neoadjuvant radiation/Xeloda; status post low anterior resection 08/04/2016; cycle 1 adjuvant CAPOX 09/09/2016. 2. History of iron deficiency anemia from rectal bleeding.   Disposition: Jermaine Smith appears stable. Plan to proceed with cycle 1 adjuvant CAPOX today as scheduled. We again reviewed potential toxicities associated with oxaliplatin and Xeloda. He is agreeable to proceed. We scheduled a return visit and cycle 2 adjuvant CAPOX in 3 weeks.  He will contact the office in the interim with any problems.  Plan reviewed with Dr. Burr Medico.    Ned Card ANP/GNP-BC   09/09/2016  12:23 PM

## 2016-09-09 NOTE — Progress Notes (Signed)
Oncology Nurse Navigator Documentation  Oncology Nurse Navigator Flowsheets 09/09/2016  Navigator Location [facility]  Referral date to RadOnc/MedOnc -  Navigator Encounter Type Treatment  Telephone -  Abnormal Finding Date -  Confirmed Diagnosis Date -  Treatment Initiated Date -  Patient Visit Type MedOnc;Follow-up  Treatment Phase Active Tx--CAPOX #1  Barriers/Navigation Needs No barriers at this time;No Questions;No Needs  Education -  Interventions None required  Coordination of Care -  Education Method -  Support Groups/Services -  Acuity Level 1  Time Spent with Patient 15  Met with patient and wife in tx area to assess for any needs. Doing well with no navigation needs.

## 2016-09-09 NOTE — Telephone Encounter (Signed)
Appointments scheduled per 10/26 LOS patient given AVS report and calendars with future scheduled appointments.

## 2016-09-09 NOTE — Patient Instructions (Signed)
[facility] Discharge Instructions for Patients Receiving Chemotherapy  Today you received the following chemotherapy agents: Oxaliplatin   To help prevent nausea and vomiting after your treatment, we encourage you to take your nausea medication as directed.    If you develop nausea and vomiting that is not controlled by your nausea medication, call the clinic.   BELOW ARE SYMPTOMS THAT SHOULD BE REPORTED IMMEDIATELY:  *FEVER GREATER THAN 100.5 F  *CHILLS WITH OR WITHOUT FEVER  NAUSEA AND VOMITING THAT IS NOT CONTROLLED WITH YOUR NAUSEA MEDICATION  *UNUSUAL SHORTNESS OF BREATH  *UNUSUAL BRUISING OR BLEEDING  TENDERNESS IN MOUTH AND THROAT WITH OR WITHOUT PRESENCE OF ULCERS  *URINARY PROBLEMS  *BOWEL PROBLEMS  UNUSUAL RASH Items with * indicate a potential emergency and should be followed up as soon as possible.  Feel free to call the clinic you have any questions or concerns. The clinic phone number is [phone].  Please show the CHEMO ALERT CARD at check-in to the Emergency Department and triage nurse.   Oxaliplatin Injection What is this medicine? OXALIPLATIN (ox AL i PLA tin) is a chemotherapy drug. It targets fast dividing cells, like cancer cells, and causes these cells to die. This medicine is used to treat cancers of the colon and rectum, and many other cancers. This medicine may be used for other purposes; ask your health care provider or pharmacist if you have questions. What should I tell my health care provider before I take this medicine? They need to know if you have any of these conditions: -kidney disease -an unusual or allergic reaction to oxaliplatin, other chemotherapy, other medicines, foods, dyes, or preservatives -pregnant or trying to get pregnant -breast-feeding How should I use this medicine? This drug is given as an infusion into a vein. It is administered in a hospital or clinic by a specially trained health care  professional. Talk to your pediatrician regarding the use of this medicine in children. Special care may be needed. Overdosage: If you think you have taken too much of this medicine contact a poison control center or emergency room at once. NOTE: This medicine is only for you. Do not share this medicine with others. What if I miss a dose? It is important not to miss a dose. Call your doctor or health care professional if you are unable to keep an appointment. What may interact with this medicine? -medicines to increase blood counts like filgrastim, pegfilgrastim, sargramostim -probenecid -some antibiotics like amikacin, gentamicin, neomycin, polymyxin B, streptomycin, tobramycin -zalcitabine Talk to your doctor or health care professional before taking any of these medicines: -acetaminophen -aspirin -ibuprofen -ketoprofen -naproxen This list may not describe all possible interactions. Give your health care provider a list of all the medicines, herbs, non-prescription drugs, or dietary supplements you use. Also tell them if you smoke, drink alcohol, or use illegal drugs. Some items may interact with your medicine. What should I watch for while using this medicine? Your condition will be monitored carefully while you are receiving this medicine. You will need important blood work done while you are taking this medicine. This medicine can make you more sensitive to cold. Do not drink cold drinks or use ice. Cover exposed skin before coming in contact with cold temperatures or cold objects. When out in cold weather wear warm clothing and cover your mouth and nose to warm the air that goes into your lungs. Tell your doctor if you get sensitive to the cold. This drug may make you   feel generally unwell. This is not uncommon, as chemotherapy can affect healthy cells as well as cancer cells. Report any side effects. Continue your course of treatment even though you feel ill unless your doctor tells you  to stop. In some cases, you may be given additional medicines to help with side effects. Follow all directions for their use. Call your doctor or health care professional for advice if you get a fever, chills or sore throat, or other symptoms of a cold or flu. Do not treat yourself. This drug decreases your body's ability to fight infections. Try to avoid being around people who are sick. This medicine may increase your risk to bruise or bleed. Call your doctor or health care professional if you notice any unusual bleeding. Be careful brushing and flossing your teeth or using a toothpick because you may get an infection or bleed more easily. If you have any dental work done, tell your dentist you are receiving this medicine. Avoid taking products that contain aspirin, acetaminophen, ibuprofen, naproxen, or ketoprofen unless instructed by your doctor. These medicines may hide a fever. Do not become pregnant while taking this medicine. Women should inform their doctor if they wish to become pregnant or think they might be pregnant. There is a potential for serious side effects to an unborn child. Talk to your health care professional or pharmacist for more information. Do not breast-feed an infant while taking this medicine. Call your doctor or health care professional if you get diarrhea. Do not treat yourself. What side effects may I notice from receiving this medicine? Side effects that you should report to your doctor or health care professional as soon as possible: -allergic reactions like skin rash, itching or hives, swelling of the face, lips, or tongue -low blood counts - This drug may decrease the number of white blood cells, red blood cells and platelets. You may be at increased risk for infections and bleeding. -signs of infection - fever or chills, cough, sore throat, pain or difficulty passing urine -signs of decreased platelets or bleeding - bruising, pinpoint red spots on the skin, black,  tarry stools, nosebleeds -signs of decreased red blood cells - unusually weak or tired, fainting spells, lightheadedness -breathing problems -chest pain, pressure -cough -diarrhea -jaw tightness -mouth sores -nausea and vomiting -pain, swelling, redness or irritation at the injection site -pain, tingling, numbness in the hands or feet -problems with balance, talking, walking -redness, blistering, peeling or loosening of the skin, including inside the mouth -trouble passing urine or change in the amount of urine Side effects that usually do not require medical attention (report to your doctor or health care professional if they continue or are bothersome): -changes in vision -constipation -hair loss -loss of appetite -metallic taste in the mouth or changes in taste -stomach pain This list may not describe all possible side effects. Call your doctor for medical advice about side effects. You may report side effects to FDA at 1-800-FDA-1088. Where should I keep my medicine? This drug is given in a hospital or clinic and will not be stored at home. NOTE: This sheet is a summary. It may not cover all possible information. If you have questions about this medicine, talk to your doctor, pharmacist, or health care provider.    2016, [employer]vier/Gold Standard. (2008-05-28 17:22:47)  

## 2016-09-10 ENCOUNTER — Telehealth: Payer: Self-pay | Admitting: *Deleted

## 2016-09-10 NOTE — Telephone Encounter (Signed)
Per LOS I have scheduled apapt and notified the scheduler

## 2016-09-15 ENCOUNTER — Encounter: Payer: Self-pay | Admitting: *Deleted

## 2016-09-16 ENCOUNTER — Telehealth: Payer: Self-pay

## 2016-09-16 NOTE — Telephone Encounter (Signed)
-----   Message from Beatriz Chancellor, RN sent at 09/09/2016  4:11 PM EDT ----- Regarding: Dr. Burr Medico chemo follow up call Patient of Dr. Burr Medico first time Oxaliplatin

## 2016-09-16 NOTE — Telephone Encounter (Signed)
lvm to call if any problems. F/u chemo.

## 2016-09-30 ENCOUNTER — Encounter: Payer: Self-pay | Admitting: Hematology

## 2016-09-30 ENCOUNTER — Telehealth: Payer: Self-pay | Admitting: *Deleted

## 2016-09-30 ENCOUNTER — Encounter: Payer: Self-pay | Admitting: Nurse Practitioner

## 2016-09-30 ENCOUNTER — Ambulatory Visit (HOSPITAL_BASED_OUTPATIENT_CLINIC_OR_DEPARTMENT_OTHER): Payer: BLUE CROSS/BLUE SHIELD | Admitting: Nurse Practitioner

## 2016-09-30 ENCOUNTER — Other Ambulatory Visit (HOSPITAL_BASED_OUTPATIENT_CLINIC_OR_DEPARTMENT_OTHER): Payer: BLUE CROSS/BLUE SHIELD

## 2016-09-30 ENCOUNTER — Telehealth: Payer: Self-pay | Admitting: Hematology

## 2016-09-30 ENCOUNTER — Ambulatory Visit (HOSPITAL_BASED_OUTPATIENT_CLINIC_OR_DEPARTMENT_OTHER): Payer: BLUE CROSS/BLUE SHIELD | Admitting: Hematology

## 2016-09-30 ENCOUNTER — Ambulatory Visit (HOSPITAL_BASED_OUTPATIENT_CLINIC_OR_DEPARTMENT_OTHER): Payer: BLUE CROSS/BLUE SHIELD

## 2016-09-30 VITALS — BP 135/75 | HR 87 | Temp 97.8°F | Resp 17 | Ht 76.0 in | Wt 202.6 lb

## 2016-09-30 VITALS — BP 171/101 | HR 78 | Resp 16

## 2016-09-30 DIAGNOSIS — R6884 Jaw pain: Secondary | ICD-10-CM

## 2016-09-30 DIAGNOSIS — C20 Malignant neoplasm of rectum: Secondary | ICD-10-CM | POA: Diagnosis not present

## 2016-09-30 DIAGNOSIS — D509 Iron deficiency anemia, unspecified: Secondary | ICD-10-CM | POA: Diagnosis not present

## 2016-09-30 DIAGNOSIS — Z5111 Encounter for antineoplastic chemotherapy: Secondary | ICD-10-CM

## 2016-09-30 DIAGNOSIS — D500 Iron deficiency anemia secondary to blood loss (chronic): Secondary | ICD-10-CM | POA: Diagnosis not present

## 2016-09-30 DIAGNOSIS — T7840XA Allergy, unspecified, initial encounter: Secondary | ICD-10-CM

## 2016-09-30 LAB — FERRITIN: Ferritin: 105 ng/ml (ref 22–316)

## 2016-09-30 LAB — CBC WITH DIFFERENTIAL/PLATELET
BASO%: 1 % (ref 0.0–2.0)
Basophils Absolute: 0 10*3/uL (ref 0.0–0.1)
EOS ABS: 0.1 10*3/uL (ref 0.0–0.5)
EOS%: 1.6 % (ref 0.0–7.0)
HCT: 37.1 % — ABNORMAL LOW (ref 38.4–49.9)
HEMOGLOBIN: 13.1 g/dL (ref 13.0–17.1)
LYMPH%: 20.7 % (ref 14.0–49.0)
MCH: 30.3 pg (ref 27.2–33.4)
MCHC: 35.3 g/dL (ref 32.0–36.0)
MCV: 85.7 fL (ref 79.3–98.0)
MONO#: 0.6 10*3/uL (ref 0.1–0.9)
MONO%: 18.2 % — AB (ref 0.0–14.0)
NEUT%: 58.5 % (ref 39.0–75.0)
NEUTROS ABS: 1.8 10*3/uL (ref 1.5–6.5)
Platelets: 134 10*3/uL — ABNORMAL LOW (ref 140–400)
RBC: 4.33 10*6/uL (ref 4.20–5.82)
RDW: 14.9 % — AB (ref 11.0–14.6)
WBC: 3.1 10*3/uL — AB (ref 4.0–10.3)
lymph#: 0.7 10*3/uL — ABNORMAL LOW (ref 0.9–3.3)

## 2016-09-30 LAB — COMPREHENSIVE METABOLIC PANEL
ALBUMIN: 3.6 g/dL (ref 3.5–5.0)
ALK PHOS: 100 U/L (ref 40–150)
ALT: 56 U/L — AB (ref 0–55)
AST: 40 U/L — AB (ref 5–34)
Anion Gap: 10 mEq/L (ref 3–11)
BILIRUBIN TOTAL: 0.98 mg/dL (ref 0.20–1.20)
BUN: 14.9 mg/dL (ref 7.0–26.0)
CO2: 21 mEq/L — ABNORMAL LOW (ref 22–29)
CREATININE: 1.1 mg/dL (ref 0.7–1.3)
Calcium: 9.6 mg/dL (ref 8.4–10.4)
Chloride: 107 mEq/L (ref 98–109)
EGFR: 74 mL/min/{1.73_m2} — ABNORMAL LOW (ref >90)
GLUCOSE: 130 mg/dL (ref 70–140)
Potassium: 4.3 mEq/L (ref 3.5–5.1)
SODIUM: 138 meq/L (ref 136–145)
TOTAL PROTEIN: 6.9 g/dL (ref 6.4–8.3)

## 2016-09-30 MED ORDER — DEXTROSE 5 % IV SOLN
Freq: Once | INTRAVENOUS | Status: AC
  Administered 2016-09-30: 12:00:00 via INTRAVENOUS

## 2016-09-30 MED ORDER — OXALIPLATIN CHEMO INJECTION 100 MG/20ML
112.0000 mg/m2 | Freq: Once | INTRAVENOUS | Status: AC
  Administered 2016-09-30: 250 mg via INTRAVENOUS
  Filled 2016-09-30: qty 40

## 2016-09-30 MED ORDER — METHYLPREDNISOLONE SODIUM SUCC 125 MG IJ SOLR
125.0000 mg | Freq: Once | INTRAMUSCULAR | Status: AC
  Administered 2016-09-30: 125 mg via INTRAVENOUS

## 2016-09-30 MED ORDER — DIPHENHYDRAMINE HCL 50 MG/ML IJ SOLN
25.0000 mg | Freq: Once | INTRAMUSCULAR | Status: AC
  Administered 2016-09-30: 25 mg via INTRAVENOUS

## 2016-09-30 MED ORDER — SODIUM CHLORIDE 0.9 % IV SOLN
Freq: Once | INTRAVENOUS | Status: AC
  Administered 2016-09-30: 13:00:00 via INTRAVENOUS
  Filled 2016-09-30: qty 8

## 2016-09-30 MED ORDER — SODIUM CHLORIDE 0.9 % IV SOLN
Freq: Once | INTRAVENOUS | Status: AC
  Administered 2016-09-30: 16:00:00 via INTRAVENOUS

## 2016-09-30 MED ORDER — FAMOTIDINE IN NACL 20-0.9 MG/50ML-% IV SOLN
20.0000 mg | Freq: Once | INTRAVENOUS | Status: AC
  Administered 2016-09-30: 20 mg via INTRAVENOUS

## 2016-09-30 MED ORDER — SODIUM CHLORIDE 0.9 % IV SOLN
Freq: Once | INTRAVENOUS | Status: DC
  Filled 2016-09-30: qty 8

## 2016-09-30 MED ORDER — CAPECITABINE 500 MG PO TABS: 850.0000 mg/m2 | ORAL_TABLET | Freq: Two times a day (BID) | ORAL | 1 refills | Status: DC

## 2016-09-30 MED ORDER — DEXAMETHASONE SODIUM PHOSPHATE 10 MG/ML IJ SOLN: 10.0000 mg | Freq: Once | INTRAMUSCULAR | Status: DC

## 2016-09-30 NOTE — Patient Instructions (Addendum)
[facility] Discharge Instructions for Patients Receiving Chemotherapy  Today you received the following chemotherapy agents Oxaliplatin  To help prevent nausea and vomiting after your treatment, we encourage you to take your nausea medication as directed.   If you develop nausea and vomiting that is not controlled by your nausea medication, call the clinic.   BELOW ARE SYMPTOMS THAT SHOULD BE REPORTED IMMEDIATELY:  *FEVER GREATER THAN 100.5 F  *CHILLS WITH OR WITHOUT FEVER  NAUSEA AND VOMITING THAT IS NOT CONTROLLED WITH YOUR NAUSEA MEDICATION  *UNUSUAL SHORTNESS OF BREATH  *UNUSUAL BRUISING OR BLEEDING  TENDERNESS IN MOUTH AND THROAT WITH OR WITHOUT PRESENCE OF ULCERS  *URINARY PROBLEMS  *BOWEL PROBLEMS  UNUSUAL RASH Items with * indicate a potential emergency and should be followed up as soon as possible.  Feel free to call the clinic you have any questions or concerns. The clinic phone number is [phone].  Please show the CHEMO ALERT CARD at check-in to the Emergency Department and triage nurse.    

## 2016-09-30 NOTE — Telephone Encounter (Signed)
Xeloda script sent to [facility].

## 2016-09-30 NOTE — Progress Notes (Signed)
SYMPTOM MANAGEMENT CLINIC    Chief Complaint: Hypersensitivity reaction  HPI:  Jermaine Smith 65 y.o. male diagnosed with rectal cancer.  Currently undergoing oxaliplatin chemotherapy and Xeloda oral therapy.   Oncology History   Rectal cancer [facility])   Staging form: Colon and Rectum, AJCC 7th Edition     Clinical stage from 03/19/2016: Stage IIIB (T3, N1, M0) - Signed by Truitt Merle, MD on 04/02/2016       Rectal cancer [id number] s/p robotic LAR 08/04/2016   03/19/2016 Initial Diagnosis    Rectal cancer ([facility])      03/19/2016 Initial Biopsy    Rectal mass biopsy showed invasive adenocarcinoma. Sigmoid colon polyps showed tubular adenoma.       03/19/2016 Procedure    Colonoscopy showed a fungating partially obstructing mass in the proximal rectum, partially circumferential involving two thirds of the lumen circumference, measuring 6 x 4 cm, 10-16 cm from the anal verge. A 10 mm polyps was found in the sigmoid colon.      03/22/2016 Tumor Marker    CEA=1.1      03/23/2016 Imaging    CT chest, abdomen and pelvis with contrast showed asymmetric rectal mass approximately 10 cm from the anus, 2 suspicious lymph nodes in the deep pelvis concerning for local nodal metastasis. No other evidence of metastatic disease.      03/25/2016 Procedure    EUS showed a T3 proximal rectal mass, there was one small 6 mm suspicious for rectal node that was suspicious for malignancy (uN1)      04/14/2016 - 05/24/2016 Chemotherapy    Xeloda 1800 mg every 12 hours, Monday through Friday, with concurrent radiation      04/14/2016 - 05/24/2016 Radiation Therapy     Adjuvant irradiation to his rectal cancer.      08/04/2016 Surgery    Robotic-assisted low anterior resection of rectal cancer.      08/04/2016 Pathology Results    Rectosigmoid segmental resection showed adenocarcinoma of the rectum, grade 2, tumor invades muscularis propria, margins were negative. Metastatic adenocarcinoma was seen treatment effect  in 2 lymph nodes, a total of 4 nodes.         Review of Systems  Constitutional: Positive for diaphoresis.  Respiratory: Positive for shortness of breath and wheezing.   All other systems reviewed and are negative.   Past Medical History:  Diagnosis Date  . FHx: brain aneurysm   . GERD (gastroesophageal reflux disease)   . Hiatal hernia   . Left inguinal hernia   . Skin cancer    hx basal cell carcinoma    Past Surgical History:  Procedure Laterality Date  . CLAVICLE SURGERY    . EUS N/A 03/25/2016   Procedure: LOWER ENDOSCOPIC ULTRASOUND (EUS);  Surgeon: Milus Banister, MD;  Location: [facility];  Service: Endoscopy;  Laterality: N/A;  . INGUINAL HERNIA REPAIR  1961   open right inguinal age 86 y/o  . lap bilateral ing. hernia repair Bilateral 05/09/12  . XI ROBOTIC ASSISTED LOWER ANTERIOR RESECTION N/A 08/04/2016   Procedure: XI ROBOTIC ASSISTED LOWER ANTERIOR RESECTION WITH RIGID PROCTOSCOPY;  Surgeon: Leighton Ruff, MD;  Location: [facility];  Service: General;  Laterality: N/A;    has Left inguinal hernia; Recurrent right inguinal hernia; Rectal cancer [id number] s/p robotic LAR 08/04/2016; Iron deficiency anemia; and Hypersensitivity reaction on his problem list.    has No Known Allergies.    Medication List       Accurate as of 09/30/16  6:26 PM. Always use your most recent med list.          aspirin 81 MG tablet Take 81 mg by mouth every morning.   capecitabine 500 MG tablet Commonly known as:  XELODA Take 4 tablets (2,000 mg total) by mouth 2 (two) times daily after a meal. Take on days 1-14 of chemotherapy.   dexlansoprazole 60 MG capsule Commonly known as:  DEXILANT Take 60 mg by mouth every morning.   ferrous sulfate 325 (65 FE) MG tablet Take 325 mg by mouth daily with breakfast.   multivitamin tablet Take 1 tablet by mouth every morning.   ondansetron 8 MG tablet Commonly known as:  ZOFRAN Take 1 tablet (8 mg total) by mouth every 8 (eight)  hours as needed for nausea.   prochlorperazine 10 MG tablet Commonly known as:  COMPAZINE Take 1 tablet (10 mg total) by mouth every 6 (six) hours as needed for nausea or vomiting.   traMADol 50 MG tablet Commonly known as:  ULTRAM Take 1-2 tablets (50-100 mg total) by mouth every 6 (six) hours as needed for moderate pain or severe pain.   VITAMIN D PO Take 1 tablet by mouth daily.        PHYSICAL EXAMINATION  Oncology Vitals 09/30/2016 09/30/2016  Height - -  Weight - -  Weight (lbs) - -  BMI (kg/m2) - -  Temp - -  Pulse 78 75  Resp 16 16  SpO2 98 99  BSA (m2) - -   BP Readings from Last 2 Encounters:  09/30/16 (!) 171/101  09/30/16 135/75    Physical Exam  Constitutional: He is oriented to person, place, and time and well-developed, well-nourished, and in no distress.  HENT:  Head: Normocephalic and atraumatic.  Mouth/Throat: Oropharynx is clear and moist.  Eyes: Conjunctivae and EOM are normal. Pupils are equal, round, and reactive to light. Right eye exhibits no discharge. Left eye exhibits no discharge. No scleral icterus.  Neck: Normal range of motion. Neck supple. No JVD present. No tracheal deviation present. No thyromegaly present.  Cardiovascular: Normal rate, regular rhythm, normal heart sounds and intact distal pulses.   Pulmonary/Chest: Effort normal and breath sounds normal. No respiratory distress. He has no wheezes. He has no rales. He exhibits no tenderness.  Trace wheezes to bilateral lower lung field; which resolved.  Abdominal: Soft. Bowel sounds are normal. He exhibits no distension and no mass. There is no tenderness. There is no rebound and no guarding.  Musculoskeletal: Normal range of motion. He exhibits no edema, tenderness or deformity.  Lymphadenopathy:    He has no cervical adenopathy.  Neurological: He is alert and oriented to person, place, and time. Gait normal.  Skin: Skin is warm and dry. No rash noted. No erythema. No pallor.    Psychiatric: Affect normal.  Nursing note and vitals reviewed.   LABORATORY DATA:. Appointment on 09/30/2016  Component Date Value Ref Range Status  . WBC 09/30/2016 3.1* 4.0 - 10.3 10e3/uL Final  . NEUT# 09/30/2016 1.8  1.5 - 6.5 10e3/uL Final  . HGB 09/30/2016 13.1  13.0 - 17.1 g/dL Final  . HCT 09/30/2016 37.1* 38.4 - 49.9 % Final  . Platelets 09/30/2016 134* 140 - 400 10e3/uL Final  . MCV 09/30/2016 85.7  79.3 - 98.0 fL Final  . MCH 09/30/2016 30.3  27.2 - 33.4 pg Final  . MCHC 09/30/2016 35.3  32.0 - 36.0 g/dL Final  . RBC 09/30/2016 4.33  4.20 - 5.82 10e6/uL  Final  . RDW 09/30/2016 14.9* 11.0 - 14.6 % Final  . lymph# 09/30/2016 0.7* 0.9 - 3.3 10e3/uL Final  . MONO# 09/30/2016 0.6  0.1 - 0.9 10e3/uL Final  . Eosinophils Absolute 09/30/2016 0.1  0.0 - 0.5 10e3/uL Final  . Basophils Absolute 09/30/2016 0.0  0.0 - 0.1 10e3/uL Final  . NEUT% 09/30/2016 58.5  39.0 - 75.0 % Final  . LYMPH% 09/30/2016 20.7  14.0 - 49.0 % Final  . MONO% 09/30/2016 18.2* 0.0 - 14.0 % Final  . EOS% 09/30/2016 1.6  0.0 - 7.0 % Final  . BASO% 09/30/2016 1.0  0.0 - 2.0 % Final  . Sodium 09/30/2016 138  136 - 145 mEq/L Final  . Potassium 09/30/2016 4.3  3.5 - 5.1 mEq/L Final  . Chloride 09/30/2016 107  98 - 109 mEq/L Final  . CO2 09/30/2016 21* 22 - 29 mEq/L Final  . Glucose 09/30/2016 130  70 - 140 mg/dl Final  . BUN 09/30/2016 14.9  7.0 - 26.0 mg/dL Final  . Creatinine 09/30/2016 1.1  0.7 - 1.3 mg/dL Final  . Total Bilirubin 09/30/2016 0.98  0.20 - 1.20 mg/dL Final  . Alkaline Phosphatase 09/30/2016 100  40 - 150 U/L Final  . AST 09/30/2016 40* 5 - 34 U/L Final  . ALT 09/30/2016 56* 0 - 55 U/L Final  . Total Protein 09/30/2016 6.9  6.4 - 8.3 g/dL Final  . Albumin 09/30/2016 3.6  3.5 - 5.0 g/dL Final  . Calcium 09/30/2016 9.6  8.4 - 10.4 mg/dL Final  . Anion Gap 09/30/2016 10  3 - 11 mEq/L Final  . EGFR 09/30/2016 74* >90 ml/min/1.73 m2 Final  . Ferritin 09/30/2016 105  22 - 316 ng/ml Final     RADIOGRAPHIC STUDIES: No results found.  ASSESSMENT/PLAN:    Rectal cancer [id number] s/p robotic LAR 08/04/2016 Patient presented to the [facility] today.  Receive cycle 2 of his oxaliplatin chemotherapy regimen.  He also continues to take Xeloda oral therapy as directed.  See further notes for details of today's visit.  Patient is scheduled to return on 10/21/2016 for labs and a follow-up visit.  Hypersensitivity reaction Patient presented to the [facility] today.  Receive cycle 2 of the sella platinum chemotherapy regimen.  He also continues taking Xeloda oral therapy as directed.  He has completely finished the oxaliplatin portion; and his peripheral IV had been discontinued.  He developed some diaphoresis, mild shortness of breath, and anxiety when he began to leave the cancer Center.  A new peripheral IV was inserted; patient was given Benadryl, Pepcid, and Solu-Medrol.  All symptoms essentially resolved; with the exception of continued elevated blood pressure.  Patient states that he typically has a low blood pressure.  See vital signs for documentation.  Patient stated that he has a blood pressure cuff at home; and would recheck his blood pressure once he got back home.  Patient was advised to return to the emergency department if his blood pressure remains elevated and he is symptomatic.  Also, patient was given both verbal and written instructions regarding the use of both Benadryl and Pepcid if needed for mild reaction symptoms.  He was encouraged to go directly to the emergency department for any worsening symptoms whatsoever.   Patient stated understanding of all instructions; and was in agreement with this plan of care. The patient knows to call the clinic with any problems, questions or concerns.   Total time spent with patient was 25 minutes;  with  greater than 75 percent of that time spent in face to face counseling regarding patient's symptoms,  and coordination of  care and follow up.  Disclaimer:This dictation was prepared with Dragon/digital dictation along with [employer]. Any transcriptional errors that result from this process are unintentional.  Drue Second, NP 09/30/2016

## 2016-09-30 NOTE — Assessment & Plan Note (Signed)
Patient presented to the [facility] today.  Receive cycle 2 of the sella platinum chemotherapy regimen.  He also continues taking Xeloda oral therapy as directed.  He has completely finished the oxaliplatin portion; and his peripheral IV had been discontinued.  He developed some diaphoresis, mild shortness of breath, and anxiety when he began to leave the cancer Center.  A new peripheral IV was inserted; patient was given Benadryl, Pepcid, and Solu-Medrol.  All symptoms essentially resolved; with the exception of continued elevated blood pressure.  Patient states that he typically has a low blood pressure.  See vital signs for documentation.  Patient stated that he has a blood pressure cuff at home; and would recheck his blood pressure once he got back home.  Patient was advised to return to the emergency department if his blood pressure remains elevated and he is symptomatic.  Also, patient was given both verbal and written instructions regarding the use of both Benadryl and Pepcid if needed for mild reaction symptoms.  He was encouraged to go directly to the emergency department for any worsening symptoms whatsoever.

## 2016-09-30 NOTE — Progress Notes (Signed)
[place]  [phone] [phone]  Clinic Follow Up Note   Patient Care Team: Maury Dus, MD as PCP - General (Family Medicine) Ladene Artist, MD as Consulting Physician (Gastroenterology) Leighton Ruff, MD as Consulting Physician (General Surgery) Truitt Merle, MD as Consulting Physician (Hematology) Kyung Rudd, MD as Consulting Physician (Radiation Oncology) Tania Ade, RN as Registered Nurse Michael Boston, MD as Consulting Physician (General Surgery) 09/30/2016   CHIEF COMPLAINTS:  Follow up proximal rectal cancer  Oncology History   Rectal cancer [facility])   Staging form: Colon and Rectum, AJCC 7th Edition     Clinical stage from 03/19/2016: Stage IIIB (T3, N1, M0) - Signed by Truitt Merle, MD on 04/02/2016       Rectal cancer [id number] s/p robotic LAR 08/04/2016   03/19/2016 Initial Diagnosis    Rectal cancer ([place])      03/19/2016 Initial Biopsy    Rectal mass biopsy showed invasive adenocarcinoma. Sigmoid colon polyps showed tubular adenoma.       03/19/2016 Procedure    Colonoscopy showed a fungating partially obstructing mass in the proximal rectum, partially circumferential involving two thirds of the lumen circumference, measuring 6 x 4 cm, 10-16 cm from the anal verge. A 10 mm polyps was found in the sigmoid colon.      03/22/2016 Tumor Marker    CEA=1.1      03/23/2016 Imaging    CT chest, abdomen and pelvis with contrast showed asymmetric rectal mass approximately 10 cm from the anus, 2 suspicious lymph nodes in the deep pelvis concerning for local nodal metastasis. No other evidence of metastatic disease.      03/25/2016 Procedure    EUS showed a T3 proximal rectal mass, there was one small 6 mm suspicious for rectal node that was suspicious for malignancy (uN1)      04/14/2016 - 05/24/2016 Chemotherapy    Xeloda 1800 mg every 12 hours, Monday through Friday, with concurrent radiation      04/14/2016 - 05/24/2016 Radiation  Therapy     Adjuvant irradiation to his rectal cancer.      08/04/2016 Surgery    Robotic-assisted low anterior resection of rectal cancer.      08/04/2016 Pathology Results    Rectosigmoid segmental resection showed adenocarcinoma of the rectum, grade 2, tumor invades muscularis propria, margins were negative. Metastatic adenocarcinoma was seen treatment effect in 2 lymph nodes, a total of 4 nodes.         HISTORY OF PRESENTING ILLNESS (04/02/2016):  Jermaine Smith 65 y.o. male is here because of his recently diagnosed rectal adenocarcinoma. He is accompanied by his wife to our multidisciplinary GI clinic today.  He has been having rectal bleeding for the past 4 months, intermittent, small amount, mixed with stool, BM is regular, but smaller caliber, no rectal pain, nausea, bloating, he has lost 5 lbs during his recent colonoscopy procedures, his appetite is normal, has been eating less, since the diagnosis of cancer. His appetite and energy level are normal. He saw gastroenterologist Dr. Fuller Plan in the past for his GERD, and self-referred back to Dr. Fuller Plan and underwent colonoscopy in early May 2017. The colonoscopy showed a polyp in sigmoid colon, and a fungating mass in the proximal rectum, biopsy showed adenocarcinoma. He underwent EUS Colonoscopy, which showed a T3 N1 disease. CT scan was negative for distant metastasis.  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 He has been very healthy,  only takes PPI for acid reflux and baby aspirin. Exercise regularly. He feels well well overall.  CURRENT THERAPY: Adjuvant chemo CAPOX every 3 weeks, started on 09/09/2016,Oxaliplatin dose reduced from cycle 2 due to side effects.  INTERIM HISTORY: Jermaine Smith returns for follow up and second cycle chemotherapy. He overall tolerated the first cycle chemotherapy moderate well. He had moderate cold sensitivity after chemotherapy, a mild tingling, which has nearly resolved this  week. He developed jaw pain when he chews food, which radiates to his ears, it resolves after a few minutes of eating, but it Recurs every time he is. This has improved much this week also. He had no appetite, fatigue, for for 5 days after chemotherapy. He tolerated Xeloda well overall, mild fatigue. He overall feels much better last week when he was off chemotherapy, his weight is stable. No fever or chills.   MEDICAL HISTORY:  Past Medical History:  Diagnosis Date  . FHx: brain aneurysm   . GERD (gastroesophageal reflux disease)   . Hiatal hernia   . Left inguinal hernia   . Skin cancer    hx basal cell carcinoma    SURGICAL HISTORY: Past Surgical History:  Procedure Laterality Date  . CLAVICLE SURGERY    . EUS N/A 03/25/2016   Procedure: LOWER ENDOSCOPIC ULTRASOUND (EUS);  Surgeon: Milus Banister, MD;  Location: [facility];  Service: Endoscopy;  Laterality: N/A;  . INGUINAL HERNIA REPAIR  1961   open right inguinal age 50 y/o  . lap bilateral ing. hernia repair Bilateral 05/09/12  . XI ROBOTIC ASSISTED LOWER ANTERIOR RESECTION N/A 08/04/2016   Procedure: XI ROBOTIC ASSISTED LOWER ANTERIOR RESECTION WITH RIGID PROCTOSCOPY;  Surgeon: Leighton Ruff, MD;  Location: [facility];  Service: General;  Laterality: N/A;    SOCIAL HISTORY: Social History   Social History  . Marital status: Married    Spouse name: N/A  . Number of children: 2  . Years of education: N/A   Occupational History  . endoscope specialist [employer]    Works on [place] [employer]  . Smoking status: Former Smoker    Packs/day: 0.50    Years: 5.00    Types: Cigarettes    Quit date: 09/03/1978  . Smokeless tobacco: Never Used  . Alcohol use 0.0 oz/week     Comment: ocassional, once a month   . Drug use: No     Comment: still smoking 1ppd,had quit in 1978,   . Sexual activity: Not on file   Other Topics Concern  . 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   FAMILY HISTORY: Family History  Problem Relation Age of Onset  . Heart disease Father   . Clotting disorder Mother     PE  . Hypotension Mother   . Colon cancer Neg Hx                              He has two adopted children, 72 and 49 yo  ALLERGIES:  has No Known Allergies.  MEDICATIONS:  Current Outpatient Prescriptions  Medication Sig Dispense Refill  . aspirin 81 MG tablet Take 81 mg by mouth every morning.     . capecitabine (XELODA) 500 MG tablet Take 4 tablets (  2,000 mg total) by mouth 2 (two) times daily after a meal. Take on days 1-14 of chemotherapy. 112 tablet 1  . Cholecalciferol (VITAMIN D PO) Take 1 tablet by mouth daily.    Marland Kitchen dexlansoprazole (DEXILANT) 60 MG capsule Take 60 mg by mouth every morning.     . ferrous sulfate 325 (65 FE) MG tablet Take 325 mg by mouth daily with breakfast.    . Multiple Vitamin (MULTIVITAMIN) tablet Take 1 tablet by mouth every morning.     . ondansetron (ZOFRAN) 8 MG tablet Take 1 tablet (8 mg total) by mouth every 8 (eight) hours as needed for nausea. 30 tablet 2  . prochlorperazine (COMPAZINE) 10 MG tablet Take 1 tablet (10 mg total) by mouth every 6 (six) hours as needed for nausea or vomiting. 30 tablet 1  . traMADol (ULTRAM) 50 MG tablet Take 1-2 tablets (50-100 mg total) by mouth every 6 (six) hours as needed for moderate pain or severe pain. 30 tablet 0   No current facility-administered medications for this visit.     REVIEW OF SYSTEMS:   Constitutional: Denies fevers, chills or abnormal night sweats Eyes: Denies blurriness of vision, double vision or watery eyes Ears, nose, mouth, throat, and face: Denies mucositis or sore throat Respiratory: Denies cough, dyspnea or wheezes Cardiovascular: Denies palpitation, chest discomfort or lower extremity  swelling Gastrointestinal:  Denies nausea, heartburn or change in bowel habits Skin: Denies abnormal skin rashes Lymphatics: Denies new lymphadenopathy or easy bruising Neurological:Denies numbness, tingling or new weaknesses Behavioral/Psych: Mood is stable, no new changes  All other systems were reviewed with the patient and are negative.  PHYSICAL EXAMINATION: ECOG PERFORMANCE STATUS: 1  Vitals:   09/30/16 1102  BP: 135/75  Pulse: 87  Resp: 17  Temp: 97.8 F (36.6 C)   Filed Weights   09/30/16 1102  Weight: 202 lb 9.6 oz (91.9 kg)    GENERAL:alert, no distress and comfortable SKIN: skin color, texture, turgor are normal, no rashes or significant lesions except some scatter macular rash at the upper inner thighs.  EYES: normal, conjunctiva are pink and non-injected, sclera clear OROPHARYNX:no exudate, no erythema and lips, buccal mucosa, and tongue normal  NECK: supple, thyroid normal size, non-tender, without nodularity LYMPH:  no palpable lymphadenopathy in the cervical, axillary or inguinal LUNGS: clear to auscultation and percussion with normal breathing effort HEART: regular rate & rhythm and no murmurs and no lower extremity edema ABDOMEN:abdomen soft, non-tender and normal bowel sounds, No organomegaly. Rectal exam was not performed. Musculoskeletal:no cyanosis of digits and no clubbing  PSYCH: alert & oriented x 3 with fluent speech NEURO: no focal motor/sensory deficits  LABORATORY DATA:  I have reviewed the data as listed CBC Latest Ref Rng & Units 09/30/2016 09/09/2016 08/26/2016  WBC 4.0 - 10.3 10e3/uL 3.1(L) 3.5(L) 3.9(L)  Hemoglobin 13.0 - 17.1 g/dL 13.1 13.0 12.7(L)  Hematocrit 38.4 - 49.9 % 37.1(L) 36.1(L) 35.7(L)  Platelets 140 - 400 10e3/uL 134(L) 145 182    CMP Latest Ref Rng & Units 09/30/2016 09/09/2016 08/26/2016  Glucose 70 - 140 mg/dl 130 119 104  BUN 7.0 - 26.0 mg/dL 14.9 13.3 14.6  Creatinine 0.7 - 1.3 mg/dL 1.1 1.0 1.0  Sodium 136 - 145  mEq/L 138 140 139  Potassium 3.5 - 5.1 mEq/L 4.3 3.9 4.0  Chloride 101 - 111 mmol/L - - -  CO2 22 - 29 mEq/L 21(L) 23 23  Calcium 8.4 - 10.4 mg/dL 9.6 9.2 9.2  Total Protein 6.4 -  8.3 g/dL 6.9 6.8 6.8  Total Bilirubin 0.20 - 1.20 mg/dL 0.98 0.87 0.82  Alkaline Phos 40 - 150 U/L 100 91 95  AST 5 - 34 U/L 40(H) 18 16  ALT 0 - 55 U/L 56(H) 16 11    CEA (ng/ml) 03/19/2016: 1.1 04/15/2016: 1.9 08/26/2016: 1.2    Diagnosis 03/19/2016 1. Colon, polyp(s), sigmoid - TUBULAR ADENOMA(S). - HIGH GRADE DYSPLASIA IS NOT IDENTIFIED. 2. Rectum, biopsy, mass - INVASIVE ADENOCARCINOMA.  Diagnosis 08/04/2016 1. Colon, segmental resection for tumor, rectosigmoid ADENOCARCINOMA OF THE RECTUM, GRADE 2, THE TUMOR INVADES MUSCULARIS PROPRIA ALL MARGINS OF RESECTION ARE NEGATIVE FOR CARCINOMA METASTATIC ADENOCARCINOMA WITH TREATMENT EFFECT IN TWO LYMPH NODES (2/4) 2. Colon, resection margin (donut), final distal BENIGN COLONIC TISSUE Microscopic Comment 1. COLON AND RECTUM (INCLUDING TRANS-ANAL RESECTION): Specimen: Rectosigmoid colon Procedure: Segmental resection Tumor site: Rectum Specimen integrity: Intact Macroscopic intactness of mesorectum: Not applicable: NA Complete: x Near complete: NA Incomplete: NA Cannot be determined (specify): NA Macroscopic tumor perforation: Muscularis propria Invasive tumor: Maximum size: 2.1 cm Histologic type(s): Adenocarcinoma Histologic grade and differentiation: G1: well differentiated/low grade G2: moderately differentiated/low grade G3: poorly differentiated/high grade G4: undifferentiated/high grade Type of polyp in which invasive carcinoma arose: Tubular adenoma Microscopic extension of invasive tumor: Muscularis propria Lymph-Vascular invasion: Negative Peri-neural invasion: Negative Tumor deposit(s) (discontinuous extramural extension): Negative Resection margins: Proximal margin: Negative Distal margin: Negative Circumferential (radial)  (posterior ascending, posterior descending; lateral and posterior mid-rectum; and entire lower 1/3 rectum):Negative Mesenteric margin (sigmoid and transverse): Negative Distance closest margin (if all above margins negative): 3.2 cm Trans-anal resection margins only: Deep margin: NA Mucosal Margin: NA Distance closest mucosal margin (if negative): NA Treatment effect (neo-adjuvant therapy): Partial Additional polyp(s): Negative Non-neoplastic findings: Unremarkable Lymph nodes: number examined 4; number positive: 2 Pathologic Staging: ypT2, ypN1, Mx Ancillary studies: Per request   RADIOGRAPHIC STUDIES: I have personally reviewed the radiological images as listed and agreed with the findings in the report. No results found.   EUS by Dr. Ardis Hughs 03/25/2016 Endosonographic Finding 1. The mass above correlated with a hyoechoic mass that clearly invades into and through the muscularis propria layer of the rectal wall (uT3). 2. The was one small (54m) suspicious perirectal lymphnode that was suspicious for malignant involvement (uN1) 3. The right obturator lymphnode that was described on recent CT scan was not visible on this exam 5cm long, non-circumferential uT3N1 (stage IIIb) proximal rectal adenocarcinoma with distal edge located 9-10cm from the anal verge. Following EUS staging, the mass was labeled with submucosal injection of SPOT.   COLONOSCOPY by Dr. SFuller Plan5/03/2016 Findings:  The digital rectal exam was normal. - A fungating partially obstructing mass was found in the proximal rectum. The mass was partially circumferential (involving two-thirds of the lumen circumference). The mass measured six cm in length by 4 cm in width. It extended from 10-16 cm from the anal verge. Oozing was present. This was biopsied with a cold forceps for histology. - A 10 mm polyp was found in the sigmoid colon. The polyp was semi-pedunculated. The polyp was removed with a hot snare. Resection and  retrieval were complete. - Internal hemorrhoids were found during retroflexion. The hemorrhoids were small and Grade I (internal hemorrhoids that do not prolapse). - The exam was otherwise normal throughout the examined colon.  IMPRESSION: - Malignant partially obstructing tumor in the proximal rectum. Biopsied. - One 10 mm polyp in the sigmoid colon, removed with a hot snare. Resected and retrieved. - Internal hemorrhoids.  ASSESSMENT & PLAN: 66 year old occasion male, presented with mild intermittent rectal bleeding for 4 months.  1. Proximal rectal adenocarcinoma, uT3N1M0, stag IIIB, ypT2N1M0,  MMR normal -I discussed his CT scan finding, colonoscopy and biopsy results in great details with patient and his wife. -Staging results was discussed with him also, the risk of cancer recurrence for stage III rectal cancer is high, the standard care is neoadjuvant chemotherapy and radiation, followed by surgery and adjuvant chemotherapy. This was discussed with him in great details -He has completed neoadjuvant chemoradiation with Xeloda, tolerated well overall. -I reviewed his surgical pathology findings. He had a partial response to neoadjuvant chemoradiation, still has significant residual disease, especially with 2 positive lymph nodes (out of 4) -I spoke with pathologist Dr. Orene Desanctis, she exam to the surgical sample again, there are only total of 4 lymph nodes on the surgical sample. -We discussed the role of adjuvant chemotherapy after surgery. Giving the significant residual disease, especially positive lymph nodes, I recommend him to have adjuvant chemotherapy with FOLFOX or CAPOX. He opted CAPOX, for a total of 4 cycles.  -due to his moderate side effects from oxaliplatin, I'll reduce his occiput and dose by 15% today, and continue Xeloda at full dose. -Potential side effects from chemotherapy and management strategies were reviewed with him again.  2. Could sensitivity and jaw  pain -Secondary to oxaliplatin, we'll dose reduce by 50% today -I encouraged him to stay warm, avoid any cold drinks or food  3. Iron deficient anemia from rectal bleeding. -He had mild anemia with hemoglobin 11.9 when he was diagnosed with rectal cancer. -His iron study showed low ferritin  and transferrin saturation, consistent with iron deficiency. This is from his rectal bleeding. -He has started ferrous sulfate twice daily, I encouraged him to take the pill with orange juice or vitamin C after meal, he is tolerating well. He is on once daily -His anemia is resolved    Plan -Lab reviewed, adequately for treatment, we'll start cycle 2 CAPOX today -I will see him back in 3 weeks   All questions were answered. The patient knows to call the clinic with any problems, questions or concerns.  I spent 25 minutes counseling the patient face to face. The total time spent in the appointment was 30 minutes and more than 50% was on counseling.    Truitt Merle, MD 09/30/2016

## 2016-09-30 NOTE — Telephone Encounter (Signed)
Message sent to chemo scheduler to be added. Appointments scheduled per 09/30/16 los. Copy of AVS report and appointment schedule was given to patient, per 09/30/16 los. °

## 2016-09-30 NOTE — Assessment & Plan Note (Signed)
Patient presented to the [facility] today.  Receive cycle 2 of his oxaliplatin chemotherapy regimen.  He also continues to take Xeloda oral therapy as directed.  See further notes for details of today's visit.  Patient is scheduled to return on 10/21/2016 for labs and a follow-up visit.

## 2016-09-30 NOTE — Progress Notes (Signed)
1558 pt stood up after d/c from oxaliplatin tx. Began feeling lightheaded and SOB. Sat back down and started on 2L [place]. Became diaphoretic and wheezing noted upon auscultation. 1600 IV access regained. NS wide open. Selena Lesser, NP notified. [employer] chairside. 25mg  IV benadryl at 1601. 125mg  IV solu-medrol at 1602. 20mg  Pepcid IVPB at 1603. 1606 O2 stopped and pt maintained O2 sats on RA. Pt stated SOB resolving and diaphoresis resolved. NS KVO at 1607. Selena Lesser at bedside [phone]. Satisfied with improvement. Information given to pt and wife about benadryl 25mg  every 6 hours and pepcid  20mg  every 12 hours at home with reaction symptoms. Selena Lesser, NP reinforced importance of rechecking BP at home and notifying symptom management or coming to ED during off hours in the event of any reaction symptoms. BP 171/101 at 1654. Called Selena Lesser, NP and okay to d/c. Pt left with wife at 8 in wheelchair. Given mask to help with cold air in transition to the car.

## 2016-10-01 ENCOUNTER — Telehealth: Payer: Self-pay | Admitting: *Deleted

## 2016-10-01 NOTE — Telephone Encounter (Signed)
Per LOS I have scheduled appts and notified the scheduler 

## 2016-10-01 NOTE — Telephone Encounter (Signed)
TCT patient to follow up on his oxaliplatin reaction yesterday in the infusion room.  Spoke with patient. He staes he is doing better today.  He thinks he breathed in too much cool air from the vents in the infusion room yesterday at the end of his treatment.  He states he has done well since getting home. He did haveone small episode of going out to the garage and breathing in the cold air in there and had some wheezing. He immediately went inside his home and stayed in the warmer temperature rooms.  Wheezing resolved. Taking the pepcid and benadryl for another day. He voices understanding of when to call us and his upcoming appts. No other questions or concerns.

## 2016-10-19 ENCOUNTER — Telehealth: Payer: Self-pay | Admitting: *Deleted

## 2016-10-19 NOTE — Telephone Encounter (Signed)
Received fax from [facility] stating that capecitabine script has been shipped via [employer] 10/18/16.

## 2016-10-20 NOTE — Progress Notes (Signed)
[place]  [phone] [phone]  Clinic Follow Up Note   Patient Care Team: Maury Dus, MD as PCP - General (Family Medicine) Ladene Artist, MD as Consulting Physician (Gastroenterology) Leighton Ruff, MD as Consulting Physician (General Surgery) Truitt Merle, MD as Consulting Physician (Hematology) Kyung Rudd, MD as Consulting Physician (Radiation Oncology) Tania Ade, RN as Registered Nurse Michael Boston, MD as Consulting Physician (General Surgery) 10/21/2016   CHIEF COMPLAINTS:  Follow up proximal rectal cancer  Oncology History   Rectal cancer [facility])   Staging form: Colon and Rectum, AJCC 7th Edition     Clinical stage from 03/19/2016: Stage IIIB (T3, N1, M0) - Signed by Truitt Merle, MD on 04/02/2016       Rectal cancer [id number] s/p robotic LAR 08/04/2016   03/19/2016 Initial Diagnosis    Rectal cancer ([place])      03/19/2016 Initial Biopsy    Rectal mass biopsy showed invasive adenocarcinoma. Sigmoid colon polyps showed tubular adenoma.       03/19/2016 Procedure    Colonoscopy showed a fungating partially obstructing mass in the proximal rectum, partially circumferential involving two thirds of the lumen circumference, measuring 6 x 4 cm, 10-16 cm from the anal verge. A 10 mm polyps was found in the sigmoid colon.      03/22/2016 Tumor Marker    CEA=1.1      03/23/2016 Imaging    CT chest, abdomen and pelvis with contrast showed asymmetric rectal mass approximately 10 cm from the anus, 2 suspicious lymph nodes in the deep pelvis concerning for local nodal metastasis. No other evidence of metastatic disease.      03/25/2016 Procedure    EUS showed a T3 proximal rectal mass, there was one small 6 mm suspicious for rectal node that was suspicious for malignancy (uN1)      04/14/2016 - 05/24/2016 Chemotherapy    Xeloda 1800 mg every 12 hours, Monday through Friday, with concurrent radiation      04/14/2016 - 05/24/2016 Radiation Therapy      Adjuvant irradiation to his rectal cancer.      08/04/2016 Surgery    Robotic-assisted low anterior resection of rectal cancer.      08/04/2016 Pathology Results    Rectosigmoid segmental resection showed adenocarcinoma of the rectum, grade 2, tumor invades muscularis propria, margins were negative. Metastatic adenocarcinoma was seen treatment effect in 2 lymph nodes, a total of 4 nodes.         HISTORY OF PRESENTING ILLNESS (04/02/2016):  [name] 65 y.o. male is here because of his recently diagnosed rectal adenocarcinoma. He is accompanied by his wife to our multidisciplinary GI clinic today.  He has been having rectal bleeding for the past 4 months, intermittent, small amount, mixed with stool, BM is regular, but smaller caliber, no rectal pain, nausea, bloating, he has lost 5 lbs during his recent colonoscopy procedures, his appetite is normal, has been eating less, since the diagnosis of cancer. His appetite and energy level are normal. He saw gastroenterologist Dr. Fuller Plan in the past for his GERD, and self-referred back to Dr. Fuller Plan and underwent colonoscopy in early May 2017. The colonoscopy showed a polyp in sigmoid colon, and a fungating mass in the proximal rectum, biopsy showed adenocarcinoma. He underwent EUS Colonoscopy, which showed a T3 N1 disease. CT scan was negative for distant metastasis.  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 He has been very healthy,  only takes PPI for acid reflux and baby aspirin. Exercise regularly. He feels well well overall.  CURRENT THERAPY: Adjuvant chemo CAPOX every 3 weeks, started on 09/09/2016,Oxaliplatin dose reduced from cycle 2 due to side effects.  INTERIM HISTORY: Mr. Claiborne returns for follow up and third cycle chemo. He developed wheezing and difficulty breathing after he completed the oxaliplatin infusion, when he stood up from chair, probably triggered by cold air above his head. He was treated  with hypersensitivity reaction protocol, and his symptoms completely resolved quickly. He did not have more episodes of difficulty breathing afterwards. However he had prolonged fatigue, cold sensitivity, and sluggish for a few days, eventually recovered last week. No significant neuropathy, he has been eating well this week, and able tolerate his routine work daily.   MEDICAL HISTORY:  Past Medical History:  Diagnosis Date  . FHx: brain aneurysm   . GERD (gastroesophageal reflux disease)   . Hiatal hernia   . Left inguinal hernia   . Skin cancer    hx basal cell carcinoma    SURGICAL HISTORY: Past Surgical History:  Procedure Laterality Date  . CLAVICLE SURGERY    . EUS N/A 03/25/2016   Procedure: LOWER ENDOSCOPIC ULTRASOUND (EUS);  Surgeon: Milus Banister, MD;  Location: [facility];  Service: Endoscopy;  Laterality: N/A;  . INGUINAL HERNIA REPAIR  1961   open right inguinal age 2 y/o  . lap bilateral ing. hernia repair Bilateral 05/09/12  . XI ROBOTIC ASSISTED LOWER ANTERIOR RESECTION N/A 08/04/2016   Procedure: XI ROBOTIC ASSISTED LOWER ANTERIOR RESECTION WITH RIGID PROCTOSCOPY;  Surgeon: Leighton Ruff, MD;  Location: [facility];  Service: General;  Laterality: N/A;    SOCIAL HISTORY: Social History   Social History  . Marital status: Married    Spouse name: N/A  . Number of children: 2  . Years of education: N/A   Occupational History  . endoscope specialist [employer]    Works on [place] [employer]  . Smoking status: Former Smoker    Packs/day: 0.50    Years: 5.00    Types: Cigarettes    Quit date: 09/03/1978  . Smokeless tobacco: Never Used  . Alcohol use 0.0 oz/week     Comment: ocassional, once a month   . Drug use: No     Comment: still smoking 1ppd,had quit in 1978,   . Sexual activity: Not on file   Other Topics Concern  . 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   FAMILY HISTORY: Family History  Problem Relation Age of Onset  . Heart disease Father   . Clotting disorder Mother     PE  . Hypotension Mother   . Colon cancer Neg Hx                              He has two adopted children, 38 and 62 yo  ALLERGIES:  has No Known Allergies.  MEDICATIONS:  Current Outpatient Prescriptions  Medication Sig Dispense Refill  . aspirin 81 MG tablet Take 81 mg by mouth every morning.     . capecitabine (XELODA) 500 MG tablet Take 5 tab in the morning and 4 tab in the evening, on days 1-14, every 21 days.  135 tablet 0  . Cholecalciferol (VITAMIN D PO) Take 1 tablet by mouth daily.    Marland Kitchen dexlansoprazole (DEXILANT) 60 MG capsule Take 60 mg by mouth every morning.     . ferrous sulfate 325 (65 FE) MG tablet Take 325 mg by mouth daily with breakfast.    . Multiple Vitamin (MULTIVITAMIN) tablet Take 1 tablet by mouth every morning.     . ondansetron (ZOFRAN) 8 MG tablet Take 1 tablet (8 mg total) by mouth every 8 (eight) hours as needed for nausea. 30 tablet 2  . prochlorperazine (COMPAZINE) 10 MG tablet Take 1 tablet (10 mg total) by mouth every 6 (six) hours as needed for nausea or vomiting. 30 tablet 1  . traMADol (ULTRAM) 50 MG tablet Take 1-2 tablets (50-100 mg total) by mouth every 6 (six) hours as needed for moderate pain or severe pain. 30 tablet 0   No current facility-administered medications for this visit.     REVIEW OF SYSTEMS:   Constitutional: Denies fevers, chills or abnormal night sweats Eyes: Denies blurriness of vision, double vision or watery eyes Ears, nose, mouth, throat, and face: Denies mucositis or sore throat Respiratory: Denies cough, dyspnea or wheezes Cardiovascular: Denies palpitation, chest discomfort or lower extremity swelling Gastrointestinal:  Denies nausea, heartburn or change in bowel  habits Skin: Denies abnormal skin rashes Lymphatics: Denies new lymphadenopathy or easy bruising Neurological:Denies numbness, tingling or new weaknesses Behavioral/Psych: Mood is stable, no new changes  All other systems were reviewed with the patient and are negative.  PHYSICAL EXAMINATION: ECOG PERFORMANCE STATUS: 1  Vitals:   10/21/16 1124  BP: (!) 142/72  Pulse: 62  Resp: 17  Temp: 97.8 F (36.6 C)   Filed Weights   10/21/16 1124  Weight: 203 lb 3.2 oz (92.2 kg)    GENERAL:alert, no distress and comfortable SKIN: skin color, texture, turgor are normal, no rashes or significant lesions except some scatter macular rash at the upper inner thighs.  EYES: normal, conjunctiva are pink and non-injected, sclera clear OROPHARYNX:no exudate, no erythema and lips, buccal mucosa, and tongue normal  NECK: supple, thyroid normal size, non-tender, without nodularity LYMPH:  no palpable lymphadenopathy in the cervical, axillary or inguinal LUNGS: clear to auscultation and percussion with normal breathing effort HEART: regular rate & rhythm and no murmurs and no lower extremity edema ABDOMEN:abdomen soft, non-tender and normal bowel sounds, No organomegaly. Rectal exam was not performed. Musculoskeletal:no cyanosis of digits and no clubbing  PSYCH: alert & oriented x 3 with fluent speech NEURO: no focal motor/sensory deficits  LABORATORY DATA:  I have reviewed the data as listed CBC Latest Ref Rng & Units 10/21/2016 09/30/2016 09/09/2016  WBC 4.0 - 10.3 10e3/uL 2.9(L) 3.1(L) 3.5(L)  Hemoglobin 13.0 - 17.1 g/dL 12.2(L) 13.1 13.0  Hematocrit 38.4 - 49.9 % 35.4(L) 37.1(L) 36.1(L)  Platelets 140 - 400 10e3/uL 105(L) 134(L) 145    CMP Latest Ref Rng & Units 10/21/2016 09/30/2016 09/09/2016  Glucose 70 - 140 mg/dl 97 130 119  BUN 7.0 - 26.0 mg/dL 13.4 14.9 13.3  Creatinine 0.7 - 1.3 mg/dL 0.9 1.1 1.0  Sodium 136 - 145 mEq/L 137 138 140  Potassium 3.5 - 5.1 mEq/L 4.3 4.3 3.9  Chloride  101 - 111 mmol/L - - -  CO2 22 - 29 mEq/L 24 21(L) 23  Calcium 8.4 - 10.4 mg/dL 9.3 9.6 9.2  Total Protein 6.4 - 8.3 g/dL 6.6 6.9 6.8  Total Bilirubin 0.20 - 1.20 mg/dL 1.02 0.98 0.87  Alkaline Phos 40 - 150 U/L 99 100 91  AST 5 - 34 U/L 32 40(H) 18  ALT 0 - 55 U/L 26 56(H) 16    CEA (ng/ml) 03/19/2016: 1.1 04/15/2016: 1.9 08/26/2016: 1.2    Diagnosis 03/19/2016 1. Colon, polyp(s), sigmoid - TUBULAR ADENOMA(S). - HIGH GRADE DYSPLASIA IS NOT IDENTIFIED. 2. Rectum, biopsy, mass - INVASIVE ADENOCARCINOMA.  Diagnosis 08/04/2016 1. Colon, segmental resection for tumor, rectosigmoid ADENOCARCINOMA OF THE RECTUM, GRADE 2, THE TUMOR INVADES MUSCULARIS PROPRIA ALL MARGINS OF RESECTION ARE NEGATIVE FOR CARCINOMA METASTATIC ADENOCARCINOMA WITH TREATMENT EFFECT IN TWO LYMPH NODES (2/4) 2. Colon, resection margin (donut), final distal BENIGN COLONIC TISSUE Microscopic Comment 1. COLON AND RECTUM (INCLUDING TRANS-ANAL RESECTION): Specimen: Rectosigmoid colon Procedure: Segmental resection Tumor site: Rectum Specimen integrity: Intact Macroscopic intactness of mesorectum: Not applicable: NA Complete: x Near complete: NA Incomplete: NA Cannot be determined (specify): NA Macroscopic tumor perforation: Muscularis propria Invasive tumor: Maximum size: 2.1 cm Histologic type(s): Adenocarcinoma Histologic grade and differentiation: G1: well differentiated/low grade G2: moderately differentiated/low grade G3: poorly differentiated/high grade G4: undifferentiated/high grade Type of polyp in which invasive carcinoma arose: Tubular adenoma Microscopic extension of invasive tumor: Muscularis propria Lymph-Vascular invasion: Negative Peri-neural invasion: Negative Tumor deposit(s) (discontinuous extramural extension): Negative Resection margins: Proximal margin: Negative Distal margin: Negative Circumferential (radial) (posterior ascending, posterior descending; lateral and posterior  mid-rectum; and entire lower 1/3 rectum):Negative Mesenteric margin (sigmoid and transverse): Negative Distance closest margin (if all above margins negative): 3.2 cm Trans-anal resection margins only: Deep margin: NA Mucosal Margin: NA Distance closest mucosal margin (if negative): NA Treatment effect (neo-adjuvant therapy): Partial Additional polyp(s): Negative Non-neoplastic findings: Unremarkable Lymph nodes: number examined 4; number positive: 2 Pathologic Staging: ypT2, ypN1, Mx Ancillary studies: Per request   RADIOGRAPHIC STUDIES: I have personally reviewed the radiological images as listed and agreed with the findings in the report. No results found.   EUS by Dr. Ardis Hughs 03/25/2016 Endosonographic Finding 1. The mass above correlated with a hyoechoic mass that clearly invades into and through the muscularis propria layer of the rectal wall (uT3). 2. The was one small (7m) suspicious perirectal lymphnode that was suspicious for malignant involvement (uN1) 3. The right obturator lymphnode that was described on recent CT scan was not visible on this exam 5cm long, non-circumferential uT3N1 (stage IIIb) proximal rectal adenocarcinoma with distal edge located 9-10cm from the anal verge. Following EUS staging, the mass was labeled with submucosal injection of SPOT.   COLONOSCOPY by Dr. SFuller Plan5/03/2016 Findings:  The digital rectal exam was normal. - A fungating partially obstructing mass was found in the proximal rectum. The mass was partially circumferential (involving two-thirds of the lumen circumference). The mass measured six cm in length by 4 cm in width. It extended from 10-16 cm from the anal verge. Oozing was present. This was biopsied with a cold forceps for histology. - A 10 mm polyp was found in the sigmoid colon. The polyp was semi-pedunculated. The polyp was removed with a hot snare. Resection and retrieval were complete. - Internal hemorrhoids were found during  retroflexion. The hemorrhoids were small and Grade I (internal hemorrhoids that do not prolapse). - The exam was otherwise normal throughout the examined colon.  IMPRESSION: - Malignant partially obstructing tumor in the proximal rectum. Biopsied. - One 10 mm polyp in the sigmoid colon, removed with a hot snare. Resected and retrieved. - Internal hemorrhoids.   ASSESSMENT & PLAN: 65year old occasion male, presented with mild intermittent rectal bleeding for 4 months.  1. Proximal rectal adenocarcinoma, uT3N1M0, stag IIIB, ypT2N1M0,  MMR normal -I discussed his CT scan finding, colonoscopy and biopsy results in great details with patient and his wife. -Staging results was discussed with him also, the risk of cancer recurrence for stage III rectal cancer is high, the standard care is neoadjuvant chemotherapy and radiation, followed by surgery and adjuvant chemotherapy. This was discussed with him in great details -He has completed neoadjuvant chemoradiation with Xeloda, tolerated well overall. -I reviewed his surgical pathology findings. He had a partial response to neoadjuvant chemoradiation, still has significant residual disease, especially with 2 positive lymph nodes (out of 4) -I spoke with pathologist Dr. Orene Desanctis, she exam to the surgical sample again, there are only total of 4 lymph nodes on the surgical sample. -We discussed the role of adjuvant chemotherapy after surgery. Giving the significant residual disease, especially positive lymph nodes, I recommend him to have adjuvant chemotherapy with FOLFOX or CAPOX. He opted CAPOX, for a total of 4 cycles.  -due to his moderate side effects from oxaliplatin,  especially hypersensitive reaction after second infusion, patient is very concerned about the side effects, we decided to hold oxaliplatin.  -will change his adjuvant chemo to xeloda 1050m bid (25035min am, 200034mn om), 2 weeks on and 1 week off, starting today. Will increase his  adjuvant chemo to a total of 5 cycles  -Potential side effects from chemotherapy and management strategies were reviewed with him again.  2. Iron deficient anemia from rectal bleeding. -He had mild anemia with hemoglobin 11.9 when he was diagnosed with rectal cancer. -His iron study showed low ferritin  and transferrin saturation, consistent with iron deficiency. This is from his rectal bleeding. -He has started ferrous sulfate twice daily, I encouraged him to take the pill with orange juice or vitamin C after meal, he is tolerating well. He is on once daily -His anemia is resolved  3. Fatigue  -secondary to chemo, improved now  4. Pancytopenia -Secondary to chemotherapy, mild overall -We'll continue monitoring   Plan -Due to his hypersensitivity to oxaliplatin, we'll hold oxaliplatin, changed to Xeloda alone from this cycle, will slightly increase the dose to 1000m4m bid, 2 weeks on, 1 week off, start cycle 3 today  -I will see him back in 3 weeks   All questions were answered. The patient knows to call the clinic with any problems, questions or concerns.  I spent 25 minutes counseling the patient face to face. The total time spent in the appointment was 30 minutes and more than 50% was on counseling.    FengTruitt Merle 10/21/2016

## 2016-10-21 ENCOUNTER — Ambulatory Visit: Payer: BLUE CROSS/BLUE SHIELD

## 2016-10-21 ENCOUNTER — Other Ambulatory Visit (HOSPITAL_BASED_OUTPATIENT_CLINIC_OR_DEPARTMENT_OTHER): Payer: BLUE CROSS/BLUE SHIELD

## 2016-10-21 ENCOUNTER — Telehealth: Payer: Self-pay | Admitting: Hematology

## 2016-10-21 ENCOUNTER — Encounter: Payer: Self-pay | Admitting: Hematology

## 2016-10-21 ENCOUNTER — Ambulatory Visit (HOSPITAL_BASED_OUTPATIENT_CLINIC_OR_DEPARTMENT_OTHER): Payer: BLUE CROSS/BLUE SHIELD | Admitting: Hematology

## 2016-10-21 VITALS — BP 142/72 | HR 62 | Temp 97.8°F | Resp 17 | Ht 76.0 in | Wt 203.2 lb

## 2016-10-21 DIAGNOSIS — R530 Neoplastic (malignant) related fatigue: Secondary | ICD-10-CM | POA: Diagnosis not present

## 2016-10-21 DIAGNOSIS — C20 Malignant neoplasm of rectum: Secondary | ICD-10-CM

## 2016-10-21 DIAGNOSIS — D61810 Antineoplastic chemotherapy induced pancytopenia: Secondary | ICD-10-CM | POA: Diagnosis not present

## 2016-10-21 DIAGNOSIS — D509 Iron deficiency anemia, unspecified: Secondary | ICD-10-CM

## 2016-10-21 LAB — CBC WITH DIFFERENTIAL/PLATELET
BASO%: 0.7 % (ref 0.0–2.0)
Basophils Absolute: 0 10*3/uL (ref 0.0–0.1)
EOS%: 1.4 % (ref 0.0–7.0)
Eosinophils Absolute: 0 10*3/uL (ref 0.0–0.5)
HEMATOCRIT: 35.4 % — AB (ref 38.4–49.9)
HGB: 12.2 g/dL — ABNORMAL LOW (ref 13.0–17.1)
LYMPH#: 0.5 10*3/uL — AB (ref 0.9–3.3)
LYMPH%: 15.4 % (ref 14.0–49.0)
MCH: 30.8 pg (ref 27.2–33.4)
MCHC: 34.5 g/dL (ref 32.0–36.0)
MCV: 89.3 fL (ref 79.3–98.0)
MONO#: 0.7 10*3/uL (ref 0.1–0.9)
MONO%: 24.8 % — ABNORMAL HIGH (ref 0.0–14.0)
NEUT%: 57.7 % (ref 39.0–75.0)
NEUTROS ABS: 1.7 10*3/uL (ref 1.5–6.5)
PLATELETS: 105 10*3/uL — AB (ref 140–400)
RBC: 3.96 10*6/uL — ABNORMAL LOW (ref 4.20–5.82)
RDW: 17.1 % — AB (ref 11.0–14.6)
WBC: 2.9 10*3/uL — AB (ref 4.0–10.3)

## 2016-10-21 LAB — COMPREHENSIVE METABOLIC PANEL
ALT: 26 U/L (ref 0–55)
ANION GAP: 8 meq/L (ref 3–11)
AST: 32 U/L (ref 5–34)
Albumin: 3.3 g/dL — ABNORMAL LOW (ref 3.5–5.0)
Alkaline Phosphatase: 99 U/L (ref 40–150)
BUN: 13.4 mg/dL (ref 7.0–26.0)
CALCIUM: 9.3 mg/dL (ref 8.4–10.4)
CHLORIDE: 106 meq/L (ref 98–109)
CO2: 24 mEq/L (ref 22–29)
Creatinine: 0.9 mg/dL (ref 0.7–1.3)
EGFR: >90 mL/min/{1.73_m2} (ref >90)
Glucose: 97 mg/dl (ref 70–140)
POTASSIUM: 4.3 meq/L (ref 3.5–5.1)
Sodium: 137 mEq/L (ref 136–145)
Total Bilirubin: 1.02 mg/dL (ref 0.20–1.20)
Total Protein: 6.6 g/dL (ref 6.4–8.3)

## 2016-10-21 LAB — FERRITIN: FERRITIN: 137 ng/mL (ref 22–316)

## 2016-10-21 MED ORDER — CAPECITABINE 500 MG PO TABS: ORAL_TABLET | ORAL | 0 refills | Status: DC

## 2016-10-21 NOTE — Telephone Encounter (Signed)
Appointments scheduled per 10/21/16 los. A copy of the AVS report and appointment schedule was given to the patient, per 10/21/16 los.  °

## 2016-11-02 ENCOUNTER — Telehealth: Payer: Self-pay | Admitting: Hematology

## 2016-11-02 NOTE — Telephone Encounter (Signed)
per YF r/s list moved 12/26 fu to 11am and lab to 10:30 am....... left message for patient and mailed schedule.

## 2016-11-09 ENCOUNTER — Ambulatory Visit (HOSPITAL_BASED_OUTPATIENT_CLINIC_OR_DEPARTMENT_OTHER): Payer: BLUE CROSS/BLUE SHIELD | Admitting: Hematology

## 2016-11-09 ENCOUNTER — Other Ambulatory Visit (HOSPITAL_BASED_OUTPATIENT_CLINIC_OR_DEPARTMENT_OTHER): Payer: BLUE CROSS/BLUE SHIELD

## 2016-11-09 ENCOUNTER — Telehealth: Payer: Self-pay | Admitting: Hematology

## 2016-11-09 ENCOUNTER — Encounter: Payer: Self-pay | Admitting: Hematology

## 2016-11-09 VITALS — BP 136/76 | HR 67 | Temp 97.5°F | Resp 18 | Ht 76.0 in | Wt 204.2 lb

## 2016-11-09 DIAGNOSIS — R5383 Other fatigue: Secondary | ICD-10-CM

## 2016-11-09 DIAGNOSIS — C20 Malignant neoplasm of rectum: Secondary | ICD-10-CM

## 2016-11-09 DIAGNOSIS — D61810 Antineoplastic chemotherapy induced pancytopenia: Secondary | ICD-10-CM | POA: Diagnosis not present

## 2016-11-09 DIAGNOSIS — L270 Generalized skin eruption due to drugs and medicaments taken internally: Secondary | ICD-10-CM

## 2016-11-09 DIAGNOSIS — K625 Hemorrhage of anus and rectum: Secondary | ICD-10-CM | POA: Diagnosis not present

## 2016-11-09 DIAGNOSIS — D500 Iron deficiency anemia secondary to blood loss (chronic): Secondary | ICD-10-CM

## 2016-11-09 LAB — COMPREHENSIVE METABOLIC PANEL
ALT: 20 U/L (ref 0–55)
ANION GAP: 8 meq/L (ref 3–11)
AST: 29 U/L (ref 5–34)
Albumin: 3.7 g/dL (ref 3.5–5.0)
Alkaline Phosphatase: 88 U/L (ref 40–150)
BILIRUBIN TOTAL: 1.11 mg/dL (ref 0.20–1.20)
BUN: 12.6 mg/dL (ref 7.0–26.0)
CHLORIDE: 107 meq/L (ref 98–109)
CO2: 24 meq/L (ref 22–29)
Calcium: 9.7 mg/dL (ref 8.4–10.4)
Creatinine: 1 mg/dL (ref 0.7–1.3)
EGFR: 78 mL/min/{1.73_m2} — AB (ref >90)
Glucose: 109 mg/dl (ref 70–140)
POTASSIUM: 4.4 meq/L (ref 3.5–5.1)
Sodium: 138 mEq/L (ref 136–145)
Total Protein: 6.9 g/dL (ref 6.4–8.3)

## 2016-11-09 LAB — CBC WITH DIFFERENTIAL/PLATELET
BASO%: 0.5 % (ref 0.0–2.0)
Basophils Absolute: 0 10*3/uL (ref 0.0–0.1)
EOS ABS: 0.1 10*3/uL (ref 0.0–0.5)
EOS%: 1.9 % (ref 0.0–7.0)
HCT: 35.7 % — ABNORMAL LOW (ref 38.4–49.9)
HGB: 12.7 g/dL — ABNORMAL LOW (ref 13.0–17.1)
LYMPH%: 13.5 % — AB (ref 14.0–49.0)
MCH: 32.3 pg (ref 27.2–33.4)
MCHC: 35.6 g/dL (ref 32.0–36.0)
MCV: 90.8 fL (ref 79.3–98.0)
MONO#: 0.7 10*3/uL (ref 0.1–0.9)
MONO%: 17.3 % — ABNORMAL HIGH (ref 0.0–14.0)
NEUT#: 2.8 10*3/uL (ref 1.5–6.5)
NEUT%: 66.8 % (ref 39.0–75.0)
PLATELETS: 130 10*3/uL — AB (ref 140–400)
RBC: 3.93 10*6/uL — AB (ref 4.20–5.82)
RDW: 20.1 % — ABNORMAL HIGH (ref 11.0–14.6)
WBC: 4.2 10*3/uL (ref 4.0–10.3)
lymph#: 0.6 10*3/uL — ABNORMAL LOW (ref 0.9–3.3)

## 2016-11-09 MED ORDER — UREA 10 % EX LOTN: 1.0000 "application " | TOPICAL_LOTION | Freq: Two times a day (BID) | CUTANEOUS | 1 refills | Status: DC

## 2016-11-09 NOTE — Progress Notes (Signed)
[place]  [phone] [phone]  Clinic Follow Up Note   Patient Care Team: Maury Dus, MD as PCP - General (Family Medicine) Ladene Artist, MD as Consulting Physician (Gastroenterology) Leighton Ruff, MD as Consulting Physician (General Surgery) Truitt Merle, MD as Consulting Physician (Hematology) Kyung Rudd, MD as Consulting Physician (Radiation Oncology) Tania Ade, RN as Registered Nurse Michael Boston, MD as Consulting Physician (General Surgery) 11/09/2016   CHIEF COMPLAINTS:  Follow up proximal rectal cancer  Oncology History   Rectal cancer [facility])   Staging form: Colon and Rectum, AJCC 7th Edition     Clinical stage from 03/19/2016: Stage IIIB (T3, N1, M0) - Signed by Truitt Merle, MD on 04/02/2016       Rectal cancer [id number] s/p robotic LAR 08/04/2016   03/19/2016 Initial Diagnosis    Rectal cancer ([place])      03/19/2016 Initial Biopsy    Rectal mass biopsy showed invasive adenocarcinoma. Sigmoid colon polyps showed tubular adenoma.       03/19/2016 Procedure    Colonoscopy showed a fungating partially obstructing mass in the proximal rectum, partially circumferential involving two thirds of the lumen circumference, measuring 6 x 4 cm, 10-16 cm from the anal verge. A 10 mm polyps was found in the sigmoid colon.      03/22/2016 Tumor Marker    CEA=1.1      03/23/2016 Imaging    CT chest, abdomen and pelvis with contrast showed asymmetric rectal mass approximately 10 cm from the anus, 2 suspicious lymph nodes in the deep pelvis concerning for local nodal metastasis. No other evidence of metastatic disease.      03/25/2016 Procedure    EUS showed a T3 proximal rectal mass, there was one small 6 mm suspicious for rectal node that was suspicious for malignancy (uN1)      04/14/2016 - 05/24/2016 Chemotherapy    Xeloda 1800 mg every 12 hours, Monday through Friday, with concurrent radiation      04/14/2016 - 05/24/2016 Radiation  Therapy     Adjuvant irradiation to his rectal cancer.      08/04/2016 Surgery    Robotic-assisted low anterior resection of rectal cancer.      08/04/2016 Pathology Results    Rectosigmoid segmental resection showed adenocarcinoma of the rectum, grade 2, tumor invades muscularis propria, margins were negative. Metastatic adenocarcinoma was seen treatment effect in 2 lymph nodes, a total of 4 nodes.        09/09/2016 -  Chemotherapy    Adjuvant chemo with CAPOX. He has hypersensitive reaction to oxaliplatin (difficulty breathing), and oxaliplatin was held after cycle 2, changed to Xeloda 1054m/m2 (25081min am and 200067mn pm), 2 weeks on and one week off, for additional 3 cycles         HISTORY OF PRESENTING ILLNESS (04/02/2016):  Jermaine Smith 65o. male is here because of his recently diagnosed rectal adenocarcinoma. He is accompanied by his wife to our multidisciplinary GI clinic today.  He has been having rectal bleeding for the past 4 months, intermittent, small amount, mixed with stool, BM is regular, but smaller caliber, no rectal pain, nausea, bloating, he has lost 5 lbs during his recent colonoscopy procedures, his appetite is normal, has been eating less, since the diagnosis of cancer. His appetite and energy level are normal. He saw gastroenterologist Dr. StaFuller Plan the past for his GERD, and self-referred back to Dr. StaFuller Pland underwent colonoscopy in early May 2017. The colonoscopy showed  a polyp in sigmoid colon, and a fungating mass in the proximal rectum, biopsy showed adenocarcinoma. He underwent EUS Colonoscopy, which showed a T3 N1 disease. CT scan was negative for distant metastasis.  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 He has been very healthy, only takes PPI for acid reflux and baby aspirin. Exercise regularly. He feels well well overall.  CURRENT THERAPY: Adjuvant chemo CAPOX every 3 weeks, started on 09/09/2016,Oxaliplatin dose  reduced from cycle 2 due to side effects, and held after cycle 2  INTERIM HISTORY: Jermaine Smith returns for follow up. She tolerated cycle 3 chemotherapy with Xeloda alone better, her neuropathy has improved, although he still has mild numbness and tingling on his fingers and toes, tolerable. He noticed dry skin and a few skin cracks on his fingers. He has mild to moderate fatigue, improved this week when he is off Xeloda. No other new complaints.  MEDICAL HISTORY:  Past Medical History:  Diagnosis Date  . FHx: brain aneurysm   . GERD (gastroesophageal reflux disease)   . Hiatal hernia   . Left inguinal hernia   . Skin cancer    hx basal cell carcinoma    SURGICAL HISTORY: Past Surgical History:  Procedure Laterality Date  . CLAVICLE SURGERY    . EUS N/A 03/25/2016   Procedure: LOWER ENDOSCOPIC ULTRASOUND (EUS);  Surgeon: Milus Banister, MD;  Location: [facility];  Service: Endoscopy;  Laterality: N/A;  . INQUINAL HERNIA REPAIR  1961   open right inguinal age 65 y/o  . lap bilateral ing. hernia repair Bilateral 05/09/12  . XI ROBOTIC ASSISTED LOWER ANTERIOR RESECTION N/A 08/04/2016   Procedure: XI ROBOTIC ASSISTED LOWER ANTERIOR RESECTION WITH RIGID PROCTOSCOPY;  Surgeon: Leighton Ruff, MD;  Location: [facility];  Service: General;  Laterality: N/A;    SOCIAL HISTORY: Social History   Social History  . Marital status: Married    Spouse name: N/A  . Number of children: 2  . Years of education: N/A   Occupational History  . endoscope specialist [employer]    Works on [place] [employer]  . Smoking status: Former Smoker    Packs/day: 0.50    Years: 5.00    Types: Cigarettes    Quit date: 09/03/1978  . Smokeless tobacco: Never Used  . Alcohol use 0.0 oz/week     Comment: ocassional, once a month   . Drug use: No     Comment: still smoking 1ppd,had quit in 1978,   . Sexual activity: Not on file   Other Topics Concern  . 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   FAMILY HISTORY: Family History  Problem Relation Age of Onset  . Heart disease Father   . Clotting disorder Mother     PE  . Hypotension Mother   . Colon cancer Neg Hx                              He has two adopted children, 52 and 31 yo  ALLERGIES:  has No Known Allergies.  MEDICATIONS:  Current Outpatient Prescriptions  Medication Sig Dispense Refill  . aspirin 81 MG tablet Take 81 mg by mouth every  morning.     . capecitabine (XELODA) 500 MG tablet Take 5 tab in the morning and 4 tab in the evening, on days 1-14, every 21 days. 135 tablet 0  . Cholecalciferol (VITAMIN D PO) Take 1 tablet by mouth daily.    Marland Kitchen dexlansoprazole (DEXILANT) 60 MG capsule Take 60 mg by mouth every morning.     . ferrous sulfate 325 (65 FE) MG tablet Take 325 mg by mouth daily with breakfast.    . Multiple Vitamin (MULTIVITAMIN) tablet Take 1 tablet by mouth every morning.     . ondansetron (ZOFRAN) 8 MG tablet Take 1 tablet (8 mg total) by mouth every 8 (eight) hours as needed for nausea. 30 tablet 2  . prochlorperazine (COMPAZINE) 10 MG tablet Take 1 tablet (10 mg total) by mouth every 6 (six) hours as needed for nausea or vomiting. 30 tablet 1  . traMADol (ULTRAM) 50 MG tablet Take 1-2 tablets (50-100 mg total) by mouth every 6 (six) hours as needed for moderate pain or severe pain. 30 tablet 0  . urea 10 % lotion Apply 1 application topically 2 (two) times daily. 237 mL 1   No current facility-administered medications for this visit.     REVIEW OF SYSTEMS:   Constitutional: Denies fevers, chills or abnormal night sweats Eyes: Denies blurriness of vision, double vision or watery eyes Ears, nose, mouth, throat, and face: Denies mucositis or sore throat Respiratory: Denies cough, dyspnea or  wheezes Cardiovascular: Denies palpitation, chest discomfort or lower extremity swelling Gastrointestinal:  Denies nausea, heartburn or change in bowel habits Skin: Denies abnormal skin rashes Lymphatics: Denies new lymphadenopathy or easy bruising Neurological:Denies numbness, tingling or new weaknesses Behavioral/Psych: Mood is stable, no new changes  All other systems were reviewed with the patient and are negative.  PHYSICAL EXAMINATION: ECOG PERFORMANCE STATUS: 1  Vitals:   11/09/16 1137  BP: 136/76  Pulse: 67  Resp: 18  Temp: 97.5 F (36.4 C)   Filed Weights   11/09/16 1137  Weight: 204 lb 3.2 oz (92.6 kg)    GENERAL:alert, no distress and comfortable SKIN: skin color, texture, turgor are normal, no rashes or significant lesions except some scatter macular rash at the upper inner thighs.  EYES: normal, conjunctiva are pink and non-injected, sclera clear OROPHARYNX:no exudate, no erythema and lips, buccal mucosa, and tongue normal  NECK: supple, thyroid normal size, non-tender, without nodularity LYMPH:  no palpable lymphadenopathy in the cervical, axillary or inguinal LUNGS: clear to auscultation and percussion with normal breathing effort HEART: regular rate & rhythm and no murmurs and no lower extremity edema ABDOMEN:abdomen soft, non-tender and normal bowel sounds, No organomegaly. Rectal exam was not performed. Musculoskeletal:no cyanosis of digits and no clubbing  PSYCH: alert & oriented x 3 with fluent speech NEURO: no focal motor/sensory deficits  LABORATORY DATA:  I have reviewed the data as listed CBC Latest Ref Rng & Units 11/09/2016 10/21/2016 09/30/2016  WBC 4.0 - 10.3 10e3/uL 4.2 2.9(L) 3.1(L)  Hemoglobin 13.0 - 17.1 g/dL 12.7(L) 12.2(L) 13.1  Hematocrit 38.4 - 49.9 % 35.7(L) 35.4(L) 37.1(L)  Platelets 140 - 400 10e3/uL 130(L) 105(L) 134(L)    CMP Latest Ref Rng & Units 11/09/2016 10/21/2016 09/30/2016  Glucose 70 - 140 mg/dl 109 97 130  BUN 7.0 -  26.0 mg/dL 12.6 13.4 14.9  Creatinine 0.7 - 1.3 mg/dL 1.0 0.9 1.1  Sodium 136 - 145 mEq/L 138 137 138  Potassium 3.5 - 5.1 mEq/L 4.4 4.3 4.3  Chloride 101 -  111 mmol/L - - -  CO2 22 - 29 mEq/L 24 24 21(L)  Calcium 8.4 - 10.4 mg/dL 9.7 9.3 9.6  Total Protein 6.4 - 8.3 g/dL 6.9 6.6 6.9  Total Bilirubin 0.20 - 1.20 mg/dL 1.11 1.02 0.98  Alkaline Phos 40 - 150 U/L 88 99 100  AST 5 - 34 U/L 29 32 40(H)  ALT 0 - 55 U/L 20 26 56(H)    CEA (ng/ml) 03/19/2016: 1.1 04/15/2016: 1.9 08/26/2016: 1.2    Diagnosis 03/19/2016 1. Colon, polyp(s), sigmoid - TUBULAR ADENOMA(S). - HIGH GRADE DYSPLASIA IS NOT IDENTIFIED. 2. Rectum, biopsy, mass - INVASIVE ADENOCARCINOMA.  Diagnosis 08/04/2016 1. Colon, segmental resection for tumor, rectosigmoid ADENOCARCINOMA OF THE RECTUM, GRADE 2, THE TUMOR INVADES MUSCULARIS PROPRIA ALL MARGINS OF RESECTION ARE NEGATIVE FOR CARCINOMA METASTATIC ADENOCARCINOMA WITH TREATMENT EFFECT IN TWO LYMPH NODES (2/4) 2. Colon, resection margin (donut), final distal BENIGN COLONIC TISSUE Microscopic Comment 1. COLON AND RECTUM (INCLUDING TRANS-ANAL RESECTION): Specimen: Rectosigmoid colon Procedure: Segmental resection Tumor site: Rectum Specimen integrity: Intact Macroscopic intactness of mesorectum: Not applicable: NA Complete: x Near complete: NA Incomplete: NA Cannot be determined (specify): NA Macroscopic tumor perforation: Muscularis propria Invasive tumor: Maximum size: 2.1 cm Histologic type(s): Adenocarcinoma Histologic grade and differentiation: G1: well differentiated/low grade G2: moderately differentiated/low grade G3: poorly differentiated/high grade G4: undifferentiated/high grade Type of polyp in which invasive carcinoma arose: Tubular adenoma Microscopic extension of invasive tumor: Muscularis propria Lymph-Vascular invasion: Negative Peri-neural invasion: Negative Tumor deposit(s) (discontinuous extramural extension): Negative Resection  margins: Proximal margin: Negative Distal margin: Negative Circumferential (radial) (posterior ascending, posterior descending; lateral and posterior mid-rectum; and entire lower 1/3 rectum):Negative Mesenteric margin (sigmoid and transverse): Negative Distance closest margin (if all above margins negative): 3.2 cm Trans-anal resection margins only: Deep margin: NA Mucosal Margin: NA Distance closest mucosal margin (if negative): NA Treatment effect (neo-adjuvant therapy): Partial Additional polyp(s): Negative Non-neoplastic findings: Unremarkable Lymph nodes: number examined 4; number positive: 2 Pathologic Staging: ypT2, ypN1, Mx Ancillary studies: Per request   RADIOGRAPHIC STUDIES: I have personally reviewed the radiological images as listed and agreed with the findings in the report. No results found.   EUS by Dr. Ardis Hughs 03/25/2016 Endosonographic Finding 1. The mass above correlated with a hyoechoic mass that clearly invades into and through the muscularis propria layer of the rectal wall (uT3). 2. The was one small (57m) suspicious perirectal lymphnode that was suspicious for malignant involvement (uN1) 3. The right obturator lymphnode that was described on recent CT scan was not visible on this exam 5cm long, non-circumferential uT3N1 (stage IIIb) proximal rectal adenocarcinoma with distal edge located 9-10cm from the anal verge. Following EUS staging, the mass was labeled with submucosal injection of SPOT.   COLONOSCOPY by Dr. SFuller Plan5/03/2016 Findings:  The digital rectal exam was normal. - A fungating partially obstructing mass was found in the proximal rectum. The mass was partially circumferential (involving two-thirds of the lumen circumference). The mass measured six cm in length by 4 cm in width. It extended from 10-16 cm from the anal verge. Oozing was present. This was biopsied with a cold forceps for histology. - A 10 mm polyp was found in the sigmoid colon.  The polyp was semi-pedunculated. The polyp was removed with a hot snare. Resection and retrieval were complete. - Internal hemorrhoids were found during retroflexion. The hemorrhoids were small and Grade I (internal hemorrhoids that do not prolapse). - The exam was otherwise normal throughout the examined colon.  IMPRESSION: - Malignant partially  obstructing tumor in the proximal rectum. Biopsied. - One 10 mm polyp in the sigmoid colon, removed with a hot snare. Resected and retrieved. - Internal hemorrhoids.   ASSESSMENT & PLAN: 65 y.o. male, presented with mild intermittent rectal bleeding for 4 months.  1. Proximal rectal adenocarcinoma, uT3N1M0, stag IIIB, ypT2N1M0,  MMR normal -I discussed his CT scan finding, colonoscopy and biopsy results in great details with patient and his wife. -Staging results was discussed with him also, the risk of cancer recurrence for stage III rectal cancer is high, the standard care is neoadjuvant chemotherapy and radiation, followed by surgery and adjuvant chemotherapy. This was discussed with him in great details -He has completed neoadjuvant chemoradiation with Xeloda, tolerated well overall. -I reviewed his surgical pathology findings. He had a partial response to neoadjuvant chemoradiation, still has significant residual disease, especially with 2 positive lymph nodes (out of 4) -I spoke with pathologist Dr. Orene Desanctis, she exam to the surgical sample again, there are only total of 4 lymph nodes on the surgical sample. -We discussed the role of adjuvant chemotherapy after surgery. Giving the significant residual disease, especially positive lymph nodes, I recommend him to have adjuvant chemotherapy with FOLFOX or CAPOX. He opted CAPOX, for a total of 4 cycles.  -due to his moderate side effects from oxaliplatin,  especially hypersensitive reaction after second infusion, patient is very concerned about the side effects, we decided to hold oxaliplatin.  -will  change his adjuvant chemo to xeloda 1065m bid (25055min am, 200054mn om), 2 weeks on and 1 week off, starting today. Will increase his adjuvant chemo to a total of 5 cycles  -Lab reviewed, adequate for treatment, he is tolerating Xeloda well overall, he will start cycle 4 Xeloda on 12/28.   2. Iron deficient anemia from rectal bleeding. -He had mild anemia with hemoglobin 11.9 when he was diagnosed with rectal cancer. -His iron study showed low ferritin  and transferrin saturation, consistent with iron deficiency. This is from his rectal bleeding. -He has started ferrous sulfate twice daily, I encouraged him to take the pill with orange juice or vitamin C after meal, he is tolerating well. He is on once daily -His anemia is resolved  3. Fatigue  -secondary to chemo, improved now  4. Pancytopenia -Secondary to chemotherapy, mild overall -We'll continue monitoring   5. Skin toxicity from Xeloda -He has mild skin erythema and a few cracks on his fingers, likely secondary to Xeloda -He will continue use Vaseline several times a day, I encouraged him to use over-the-counter hydrocortisone once or twice a day, and I called in urea cream for him   Plan -He will start cycle 4 Xeloda on 11/11/2069 -I will see him back in 3 weeks before cycle 5 (last cycle)  All questions were answered. The patient knows to call the clinic with any problems, questions or concerns.  I spent 25 minutes counseling the patient face to face. The total time spent in the appointment was 30 minutes and more than 50% was on counseling.    FenTruitt MerleD 11/09/2016

## 2016-11-09 NOTE — Telephone Encounter (Signed)
Appointments scheduled per 12/26 LOS. Patient given AVS report and calendars with future scheduled appointments. °

## 2016-11-30 ENCOUNTER — Other Ambulatory Visit: Payer: Self-pay | Admitting: *Deleted

## 2016-11-30 DIAGNOSIS — C20 Malignant neoplasm of rectum: Secondary | ICD-10-CM

## 2016-11-30 MED ORDER — CAPECITABINE 500 MG PO TABS: ORAL_TABLET | ORAL | 0 refills | Status: DC

## 2016-11-30 NOTE — Progress Notes (Signed)
[place]  [phone] [phone]  Clinic Follow Up Note   Patient Care Team: Maury Dus, MD as PCP - General (Family Medicine) Ladene Artist, MD as Consulting Physician (Gastroenterology) Leighton Ruff, MD as Consulting Physician (General Surgery) Truitt Merle, MD as Consulting Physician (Hematology) Kyung Rudd, MD as Consulting Physician (Radiation Oncology) Tania Ade, RN as Registered Nurse Michael Boston, MD as Consulting Physician (General Surgery) 12/02/2016   CHIEF COMPLAINTS:  Follow up proximal rectal cancer  Oncology History   Cancer Staging Rectal cancer ypT2ypN1cM0 s/p robotic LAR 08/04/2016 Staging form: Colon and Rectum, AJCC 7th Edition - Clinical stage from 03/19/2016: Stage IIIB (T3, N1, M0) - Signed by Truitt Merle, MD on 04/02/2016 - Pathologic stage from 08/04/2016: Stage IIIA (T2, N1b, cM0) - Signed by Truitt Merle, MD on 08/26/2016        Rectal cancer ypT2ypN1cM0 s/p robotic LAR 08/04/2016   03/19/2016 Initial Diagnosis    Rectal cancer ([place])      03/19/2016 Initial Biopsy    Rectal mass biopsy showed invasive adenocarcinoma. Sigmoid colon polyps showed tubular adenoma.       03/19/2016 Procedure    Colonoscopy showed a fungating partially obstructing mass in the proximal rectum, partially circumferential involving two thirds of the lumen circumference, measuring 6 x 4 cm, 10-16 cm from the anal verge. A 10 mm polyps was found in the sigmoid colon.      03/22/2016 Tumor Marker    CEA=1.1      03/23/2016 Imaging    CT chest, abdomen and pelvis with contrast showed asymmetric rectal mass approximately 10 cm from the anus, 2 suspicious lymph nodes in the deep pelvis concerning for local nodal metastasis. No other evidence of metastatic disease.      03/25/2016 Procedure    EUS showed a T3 proximal rectal mass, there was one small 6 mm suspicious for rectal node that was suspicious for malignancy (uN1)      04/14/2016 -  05/24/2016 Chemotherapy    Xeloda 1800 mg every 12 hours, Monday through Friday, with concurrent radiation      04/14/2016 - 05/24/2016 Radiation Therapy     Adjuvant irradiation to his rectal cancer.      08/04/2016 Surgery    Robotic-assisted low anterior resection of rectal cancer.      08/04/2016 Pathology Results    Rectosigmoid segmental resection showed adenocarcinoma of the rectum, grade 2, tumor invades muscularis propria, margins were negative. Metastatic adenocarcinoma was seen treatment effect in 2 lymph nodes, a total of 4 nodes.        09/09/2016 -  Chemotherapy    Adjuvant chemo with CAPOX. He has hypersensitive reaction to oxaliplatin (difficulty breathing), and oxaliplatin was held after cycle 2, changed to Xeloda 1083m/m2 (25072min am and 200077mn pm), 2 weeks on and one week off, for additional 3 cycles         HISTORY OF PRESENTING ILLNESS (04/02/2016):  Jermaine Smith 66o. male is here because of his recently diagnosed rectal adenocarcinoma. He is accompanied by his wife to our multidisciplinary GI clinic today.  He has been having rectal bleeding for the past 4 months, intermittent, small amount, mixed with stool, BM is regular, but smaller caliber, no rectal pain, nausea, bloating, he has lost 5 lbs during his recent colonoscopy procedures, his appetite is normal, has been eating less, since the diagnosis of cancer. His appetite and energy level are normal. He saw gastroenterologist Dr. StaFuller Plan the  past for his GERD, and self-referred back to Dr. Fuller Plan and underwent colonoscopy in early May 2017. The colonoscopy showed a polyp in sigmoid colon, and a fungating mass in the proximal rectum, biopsy showed adenocarcinoma. He underwent EUS Colonoscopy, which showed a T3 N1 disease. CT scan was negative for distant metastasis.  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 He has been very healthy, only takes PPI for acid reflux and baby  aspirin. Exercise regularly. He feels well well overall.  CURRENT THERAPY: Adjuvant chemo CAPOX every 3 weeks, started on 09/09/2016,Oxaliplatin dose reduced from cycle 2 due to side effects, and held after cycle 2  INTERIM HISTORY: Jermaine Smith returns for follow up. He completed cycle 4 Xeloda one week ago, tolerated well overall, his main complaint is skin erythema is palms, peeling off and a few crackers on the tip of the fingers. He has being wearing gloves and a using moisturizer and urea cream. He has numbness, and tingling and cold sensation on his fingers and toes, has mild difficulty with his hand function. No issues with walking, mild fatigue, appetite is decent, no other new complaints.  MEDICAL HISTORY:  Past Medical History:  Diagnosis Date  . FHx: brain aneurysm   . GERD (gastroesophageal reflux disease)   . Hiatal hernia   . Left inguinal hernia   . Skin cancer    hx basal cell carcinoma    SURGICAL HISTORY: Past Surgical History:  Procedure Laterality Date  . CLAVICLE SURGERY    . EUS N/A 03/25/2016   Procedure: LOWER ENDOSCOPIC ULTRASOUND (EUS);  Surgeon: Milus Banister, MD;  Location: [facility];  Service: Endoscopy;  Laterality: N/A;  . INGUINAL HERNIA REPAIR  1961   open right inguinal age 66 y/o  . lap bilateral ing. hernia repair Bilateral 05/09/12  . XI ROBOTIC ASSISTED LOWER ANTERIOR RESECTION N/A 08/04/2016   Procedure: XI ROBOTIC ASSISTED LOWER ANTERIOR RESECTION WITH RIGID PROCTOSCOPY;  Surgeon: Leighton Ruff, MD;  Location: [facility];  Service: General;  Laterality: N/A;    SOCIAL HISTORY: Social History   Social History  . Marital status: Married    Spouse name: N/A  . Number of children: 2  . Years of education: N/A   Occupational History  . endoscope specialist [employer]    Works on [employer]  . Smoking status: Former Smoker    Packs/day: 0.50    Years: 5.00    Types: Cigarettes     Quit date: 09/03/1978  . Smokeless tobacco: Never Used  . Alcohol use 0.0 oz/week     Comment: ocassional, once a month   . Drug use: No     Comment: still smoking 1ppd,had quit in 1978,   . Sexual activity: Not on file   Other Topics Concern  . 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   FAMILY HISTORY: Family History  Problem Relation Age of Onset  . Heart disease Father   . Clotting disorder Mother     PE  . Hypotension Mother   . Colon cancer Neg Hx  He has two adopted children, 2 and 15 yo  ALLERGIES:  has No Known Allergies.  MEDICATIONS:  Current Outpatient Prescriptions  Medication Sig Dispense Refill  . aspirin 81 MG tablet Take 81 mg by mouth every morning.     . Cholecalciferol (VITAMIN D PO) Take 1 tablet by mouth daily.    Marland Kitchen dexlansoprazole (DEXILANT) 60 MG capsule Take 60 mg by mouth every morning.     . ferrous sulfate 325 (65 FE) MG tablet Take 325 mg by mouth daily with breakfast.    . Multiple Vitamin (MULTIVITAMIN) tablet Take 1 tablet by mouth every morning.     . ondansetron (ZOFRAN) 8 MG tablet Take 1 tablet (8 mg total) by mouth every 8 (eight) hours as needed for nausea. 30 tablet 2  . prochlorperazine (COMPAZINE) 10 MG tablet Take 1 tablet (10 mg total) by mouth every 6 (six) hours as needed for nausea or vomiting. 30 tablet 1  . traMADol (ULTRAM) 50 MG tablet Take 1-2 tablets (50-100 mg total) by mouth every 6 (six) hours as needed for moderate pain or severe pain. 30 tablet 0  . urea 10 % lotion Apply 1 application topically 2 (two) times daily. 237 mL 1  . capecitabine (XELODA) 500 MG tablet Take 5 tab in the morning and 4 tab in the evening, on days 1-14, every 21 days. (Patient not taking: Reported on 12/02/2016) 135 tablet 0   No current  facility-administered medications for this visit.     REVIEW OF SYSTEMS:   Constitutional: Denies fevers, chills or abnormal night sweats Eyes: Denies blurriness of vision, double vision or watery eyes Ears, nose, mouth, throat, and face: Denies mucositis or sore throat Respiratory: Denies cough, dyspnea or wheezes Cardiovascular: Denies palpitation, chest discomfort or lower extremity swelling Gastrointestinal:  Denies nausea, heartburn or change in bowel habits Skin: Denies abnormal skin rashes Lymphatics: Denies new lymphadenopathy or easy bruising Neurological:Denies numbness, tingling or new weaknesses Behavioral/Psych: Mood is stable, no new changes  All other systems were reviewed with the patient and are negative.  PHYSICAL EXAMINATION: ECOG PERFORMANCE STATUS: 1  Vitals:   12/02/16 1013  BP: 126/62  Pulse: 65  Resp: 17  Temp: 97.6 F (36.4 C)   Filed Weights   12/02/16 1013  Weight: 205 lb 6.4 oz (93.2 kg)   GENERAL:alert, no distress and comfortable SKIN: skin color, texture, turgor are normal, (+) skin erythema on both palms, skin peeling on the tip of the fingers, a few cracks  EYES: normal, conjunctiva are pink and non-injected, sclera clear OROPHARYNX:no exudate, no erythema and lips, buccal mucosa, and tongue normal  NECK: supple, thyroid normal size, non-tender, without nodularity LYMPH:  no palpable lymphadenopathy in the cervical, axillary or inguinal LUNGS: clear to auscultation and percussion with normal breathing effort HEART: regular rate & rhythm and no murmurs and no lower extremity edema ABDOMEN:abdomen soft, non-tender and normal bowel sounds, No organomegaly. Rectal exam was not performed. Musculoskeletal:no cyanosis of digits and no clubbing  PSYCH: alert & oriented x 3 with fluent speech NEURO: no focal motor/sensory deficits  LABORATORY DATA:  I have reviewed the data as listed CBC Latest Ref Rng & Units 12/02/2016 11/09/2016 10/21/2016  WBC  4.0 - 10.3 10e3/uL 3.9(L) 4.2 2.9(L)  Hemoglobin 13.0 - 17.1 g/dL 12.2(L) 12.7(L) 12.2(L)  Hematocrit 38.4 - 49.9 % 34.7(L) 35.7(L) 35.4(L)  Platelets 140 - 400 10e3/uL 117(L) 130(L) 105(L)    CMP Latest Ref Rng & Units 12/02/2016 11/09/2016 10/21/2016  Glucose  70 - 140 mg/dl 91 109 97  BUN 7.0 - 26.0 mg/dL 15.4 12.6 13.4  Creatinine 0.7 - 1.3 mg/dL 1.0 1.0 0.9  Sodium 136 - 145 mEq/L 140 138 137  Potassium 3.5 - 5.1 mEq/L 4.0 4.4 4.3  Chloride 101 - 111 mmol/L - - -  CO2 22 - 29 mEq/L 23 24 24   Calcium 8.4 - 10.4 mg/dL 9.4 9.7 9.3  Total Protein 6.4 - 8.3 g/dL 6.7 6.9 6.6  Total Bilirubin 0.20 - 1.20 mg/dL 1.42(H) 1.11 1.02  Alkaline Phos 40 - 150 U/L 87 88 99  AST 5 - 34 U/L 28 29 32  ALT 0 - 55 U/L 18 20 26     CEA (ng/ml) 03/19/2016: 1.1 04/15/2016: 1.9 08/26/2016: 1.2    CT chest w/ contrast 03/26/16 IMPRESSION: 1. No definite metastatic disease in the chest. The patient has scattered calcified granulomata in both lungs with two very tiny non calcified nodules identified. These noncalcified lesions are also likely benign, but close attention on follow-up recommended.  Diagnosis 03/19/2016 1. Colon, polyp(s), sigmoid - TUBULAR ADENOMA(S). - HIGH GRADE DYSPLASIA IS NOT IDENTIFIED. 2. Rectum, biopsy, mass - INVASIVE ADENOCARCINOMA.  Diagnosis 08/04/2016 1. Colon, segmental resection for tumor, rectosigmoid ADENOCARCINOMA OF THE RECTUM, GRADE 2, THE TUMOR INVADES MUSCULARIS PROPRIA ALL MARGINS OF RESECTION ARE NEGATIVE FOR CARCINOMA METASTATIC ADENOCARCINOMA WITH TREATMENT EFFECT IN TWO LYMPH NODES (2/4) 2. Colon, resection margin (donut), final distal BENIGN COLONIC TISSUE Microscopic Comment 1. COLON AND RECTUM (INCLUDING TRANS-ANAL RESECTION): Specimen: Rectosigmoid colon Procedure: Segmental resection Tumor site: Rectum Specimen integrity: Intact Macroscopic intactness of mesorectum: Not applicable: NA Complete: x Near complete: NA Incomplete: NA Cannot be  determined (specify): NA Macroscopic tumor perforation: Muscularis propria Invasive tumor: Maximum size: 2.1 cm Histologic type(s): Adenocarcinoma Histologic grade and differentiation: G1: well differentiated/low grade G2: moderately differentiated/low grade G3: poorly differentiated/high grade G4: undifferentiated/high grade Type of polyp in which invasive carcinoma arose: Tubular adenoma Microscopic extension of invasive tumor: Muscularis propria Lymph-Vascular invasion: Negative Peri-neural invasion: Negative Tumor deposit(s) (discontinuous extramural extension): Negative Resection margins: Proximal margin: Negative Distal margin: Negative Circumferential (radial) (posterior ascending, posterior descending; lateral and posterior mid-rectum; and entire lower 1/3 rectum):Negative Mesenteric margin (sigmoid and transverse): Negative Distance closest margin (if all above margins negative): 3.2 cm Trans-anal resection margins only: Deep margin: NA Mucosal Margin: NA Distance closest mucosal margin (if negative): NA Treatment effect (neo-adjuvant therapy): Partial Additional polyp(s): Negative Non-neoplastic findings: Unremarkable Lymph nodes: number examined 4; number positive: 2 Pathologic Staging: ypT2, ypN1, Mx Ancillary studies: Per request   RADIOGRAPHIC STUDIES: I have personally reviewed the radiological images as listed and agreed with the findings in the report. No results found.   EUS by Dr. Ardis Hughs 03/25/2016 Endosonographic Finding 1. The mass above correlated with a hyoechoic mass that clearly invades into and through the muscularis propria layer of the rectal wall (uT3). 2. The was one small (65m) suspicious perirectal lymphnode that was suspicious for malignant involvement (uN1) 3. The right obturator lymphnode that was described on recent CT scan was not visible on this exam 5cm long, non-circumferential uT3N1 (stage IIIb) proximal rectal adenocarcinoma  with distal edge located 9-10cm from the anal verge. Following EUS staging, the mass was labeled with submucosal injection of SPOT.   COLONOSCOPY by Dr. SFuller Plan5/03/2016 Findings:  The digital rectal exam was normal. - A fungating partially obstructing mass was found in the proximal rectum. The mass was partially circumferential (involving two-thirds of the lumen circumference). The mass measured six  cm in length by 4 cm in width. It extended from 10-16 cm from the anal verge. Oozing was present. This was biopsied with a cold forceps for histology. - A 10 mm polyp was found in the sigmoid colon. The polyp was semi-pedunculated. The polyp was removed with a hot snare. Resection and retrieval were complete. - Internal hemorrhoids were found during retroflexion. The hemorrhoids were small and Grade I (internal hemorrhoids that do not prolapse). - The exam was otherwise normal throughout the examined colon.  IMPRESSION: - Malignant partially obstructing tumor in the proximal rectum. Biopsied. - One 10 mm polyp in the sigmoid colon, removed with a hot snare. Resected and retrieved. - Internal hemorrhoids.   ASSESSMENT & PLAN: 65 y.o. male, presented with mild intermittent rectal bleeding for 4 months.  1. Proximal rectal adenocarcinoma, uT3N1M0, stag IIIB, ypT2N1M0,  MMR normal -I previously discussed his CT scan finding, colonoscopy and biopsy results in great details with patient and his wife. -Staging results was discussed with him also, the risk of cancer recurrence for stage III rectal cancer is high, the standard care is neoadjuvant chemotherapy and radiation, followed by surgery and adjuvant chemotherapy. This was discussed with him in great details -He has completed neoadjuvant chemoradiation with Xeloda, tolerated well overall. -I previously reviewed his surgical pathology findings. He had a partial response to neoadjuvant chemoradiation, still has significant residual disease,  especially with 2 positive lymph nodes (out of 4) -We previously discussed the role of adjuvant chemotherapy after surgery. Giving the significant residual disease, especially positive lymph nodes, I recommend him to have adjuvant chemotherapy with FOLFOX or CAPOX. He opted CAPOX, for a total of 4 cycles.  -due to his moderate side effects from oxaliplatin,  especially hypersensitive reaction after second infusion, oxaliplatin has been held after cycle 2 -He is tolerating Xeloda alone better, with main side effects of hand-foot syndrome. We'll postpone his next cycle for one week -Lab results reviewed, mild pancytopenia, slightly elevated total bilirubin, likely related to Xeloda, we'll monitoring. -He will start last cycle Xeloda in a week. --I discussed the risk of cancer recurrence in the future. I discussed the surveillance plan, which is a physical exam and lab test (including CBC, CMP and CEA) every 3 months for the first 2 years, then every 6-12 months, colonoscopy in one year, and surveilliance CT scan every 6-12 month for up to 5 year. We plan to repeat colonoscopy in a CT scan in May.  2. Iron deficient anemia from rectal bleeding. -He had mild anemia with hemoglobin 11.9 when he was diagnosed with rectal cancer. -His iron study showed low ferritin and transferrin saturation, consistent with iron deficiency. This is from his rectal bleeding. -He has started ferrous sulfate twice daily, I encouraged him to take the pill with orange juice or vitamin C after meal, he is tolerating well. He is on once daily -Repeat his iron study results are still pending from today  3. Fatigue  -secondary to chemo, mild overall  -I encouraged him to gradually increase his activity level and exercise, after he completes chemotherapy.  4. Pancytopenia -Secondary to chemotherapy, mild overall -We'll continue monitoring   5. Skin toxicity from Xeloda -He has mild skin erythema and a few cracks on his  fingers, secondary to Xeloda -He will continue use Vaseline several times a day and Urea cream, he knows to use gloves   Plan -He will start cycle 5 (last cycle) Xeloda in a week (postpone due to skin toxicity) -I  will see him back in 5-6 weeks for follow up   All questions were answered. The patient knows to call the clinic with any problems, questions or concerns.  I spent 25 minutes counseling the patient face to face. The total time spent in the appointment was 30 minutes and more than 50% was on counseling.   Truitt Merle, MD 12/02/2016

## 2016-12-02 ENCOUNTER — Ambulatory Visit (HOSPITAL_BASED_OUTPATIENT_CLINIC_OR_DEPARTMENT_OTHER): Payer: BLUE CROSS/BLUE SHIELD | Admitting: Hematology

## 2016-12-02 ENCOUNTER — Encounter: Payer: Self-pay | Admitting: Hematology

## 2016-12-02 ENCOUNTER — Telehealth: Payer: Self-pay | Admitting: Hematology

## 2016-12-02 ENCOUNTER — Other Ambulatory Visit (HOSPITAL_BASED_OUTPATIENT_CLINIC_OR_DEPARTMENT_OTHER): Payer: BLUE CROSS/BLUE SHIELD

## 2016-12-02 VITALS — BP 126/62 | HR 65 | Temp 97.6°F | Resp 17 | Ht 76.0 in | Wt 205.4 lb

## 2016-12-02 DIAGNOSIS — D509 Iron deficiency anemia, unspecified: Secondary | ICD-10-CM

## 2016-12-02 DIAGNOSIS — C20 Malignant neoplasm of rectum: Secondary | ICD-10-CM

## 2016-12-02 DIAGNOSIS — R530 Neoplastic (malignant) related fatigue: Secondary | ICD-10-CM

## 2016-12-02 DIAGNOSIS — D500 Iron deficiency anemia secondary to blood loss (chronic): Secondary | ICD-10-CM

## 2016-12-02 DIAGNOSIS — G620 Drug-induced polyneuropathy: Secondary | ICD-10-CM

## 2016-12-02 DIAGNOSIS — D61818 Other pancytopenia: Secondary | ICD-10-CM

## 2016-12-02 LAB — COMPREHENSIVE METABOLIC PANEL
ALBUMIN: 3.7 g/dL (ref 3.5–5.0)
ALK PHOS: 87 U/L (ref 40–150)
ALT: 18 U/L (ref 0–55)
AST: 28 U/L (ref 5–34)
Anion Gap: 10 mEq/L (ref 3–11)
BUN: 15.4 mg/dL (ref 7.0–26.0)
CO2: 23 mEq/L (ref 22–29)
Calcium: 9.4 mg/dL (ref 8.4–10.4)
Chloride: 107 mEq/L (ref 98–109)
Creatinine: 1 mg/dL (ref 0.7–1.3)
EGFR: 77 mL/min/{1.73_m2} — AB (ref >90)
GLUCOSE: 91 mg/dL (ref 70–140)
POTASSIUM: 4 meq/L (ref 3.5–5.1)
SODIUM: 140 meq/L (ref 136–145)
Total Bilirubin: 1.42 mg/dL — ABNORMAL HIGH (ref 0.20–1.20)
Total Protein: 6.7 g/dL (ref 6.4–8.3)

## 2016-12-02 LAB — CBC WITH DIFFERENTIAL/PLATELET
BASO%: 0.3 % (ref 0.0–2.0)
BASOS ABS: 0 10*3/uL (ref 0.0–0.1)
EOS ABS: 0.1 10*3/uL (ref 0.0–0.5)
EOS%: 2.3 % (ref 0.0–7.0)
HCT: 34.7 % — ABNORMAL LOW (ref 38.4–49.9)
HEMOGLOBIN: 12.2 g/dL — AB (ref 13.0–17.1)
LYMPH%: 13.4 % — AB (ref 14.0–49.0)
MCH: 33.2 pg (ref 27.2–33.4)
MCHC: 35.2 g/dL (ref 32.0–36.0)
MCV: 94.6 fL (ref 79.3–98.0)
MONO#: 0.6 10*3/uL (ref 0.1–0.9)
MONO%: 15.2 % — AB (ref 0.0–14.0)
NEUT#: 2.7 10*3/uL (ref 1.5–6.5)
NEUT%: 68.8 % (ref 39.0–75.0)
Platelets: 117 10*3/uL — ABNORMAL LOW (ref 140–400)
RBC: 3.67 10*6/uL — AB (ref 4.20–5.82)
RDW: 19.8 % — ABNORMAL HIGH (ref 11.0–14.6)
WBC: 3.9 10*3/uL — ABNORMAL LOW (ref 4.0–10.3)
lymph#: 0.5 10*3/uL — ABNORMAL LOW (ref 0.9–3.3)

## 2016-12-02 LAB — IRON AND TIBC
%SAT: 38 % (ref 20–55)
Iron: 134 ug/dL (ref 42–163)
TIBC: 350 ug/dL (ref 202–409)
UIBC: 216 ug/dL (ref 117–376)

## 2016-12-02 LAB — CEA (IN HOUSE-CHCC): CEA (CHCC-In House): 2.88 ng/mL (ref 0.00–5.00)

## 2016-12-02 LAB — FERRITIN: Ferritin: 90 ng/ml (ref 22–316)

## 2016-12-02 NOTE — Telephone Encounter (Signed)
Gave patient avs report and appointments for February.  °

## 2016-12-03 LAB — CEA: CEA: 3.2 ng/mL (ref 0.0–4.7)

## 2016-12-07 ENCOUNTER — Telehealth: Payer: Self-pay | Admitting: *Deleted

## 2016-12-07 NOTE — Telephone Encounter (Signed)
Received call from pt's wife stating that the copay would be $1300 per [employer] & this would be a little high for them.  Discussed with Pharmacist here & have some samples of xeloda that we can get for pt since this is last treatment.  She will pick up tomorrow.

## 2017-01-05 NOTE — Progress Notes (Signed)
[place]  [phone] [phone]  Clinic Follow Up Note   Patient Care Team: Maury Dus, MD as PCP - General (Family Medicine) Ladene Artist, MD as Consulting Physician (Gastroenterology) Leighton Ruff, MD as Consulting Physician (General Surgery) Truitt Merle, MD as Consulting Physician (Hematology) Kyung Rudd, MD as Consulting Physician (Radiation Oncology) Tania Ade, RN as Registered Nurse Michael Boston, MD as Consulting Physician (General Surgery) 01/06/2017   CHIEF COMPLAINTS:  Follow up proximal rectal cancer  Oncology History   Cancer Staging Rectal cancer ypT2ypN1cM0 s/p robotic LAR 08/04/2016 Staging form: Colon and Rectum, AJCC 7th Edition - Clinical stage from 03/19/2016: Stage IIIB (T3, N1, M0) - Signed by Truitt Merle, MD on 04/02/2016 - Pathologic stage from 08/04/2016: Stage IIIA (T2, N1b, cM0) - Signed by Truitt Merle, MD on 08/26/2016        Rectal cancer ypT2ypN1cM0 s/p robotic LAR 08/04/2016   03/19/2016 Initial Diagnosis    Rectal cancer ([place])      03/19/2016 Initial Biopsy    Rectal mass biopsy showed invasive adenocarcinoma. Sigmoid colon polyps showed tubular adenoma.       03/19/2016 Procedure    Colonoscopy showed a fungating partially obstructing mass in the proximal rectum, partially circumferential involving two thirds of the lumen circumference, measuring 6 x 4 cm, 10-16 cm from the anal verge. A 10 mm polyps was found in the sigmoid colon.      03/22/2016 Tumor Marker    CEA=1.1      03/23/2016 Imaging    CT chest, abdomen and pelvis with contrast showed asymmetric rectal mass approximately 10 cm from the anus, 2 suspicious lymph nodes in the deep pelvis concerning for local nodal metastasis. No other evidence of metastatic disease.      03/25/2016 Procedure    EUS showed a T3 proximal rectal mass, there was one small 6 mm suspicious for rectal node that was suspicious for malignancy (uN1)      04/14/2016 -  05/24/2016 Chemotherapy    Xeloda 1800 mg every 12 hours, Monday through Friday, with concurrent radiation      04/14/2016 - 05/24/2016 Radiation Therapy     Adjuvant irradiation to his rectal cancer.      08/04/2016 Surgery    Robotic-assisted low anterior resection of rectal cancer.      08/04/2016 Pathology Results    Rectosigmoid segmental resection showed adenocarcinoma of the rectum, grade 2, tumor invades muscularis propria, margins were negative. Metastatic adenocarcinoma was seen treatment effect in 2 lymph nodes, a total of 4 nodes.        09/09/2016 - 12/23/2016 Chemotherapy    Adjuvant chemo with CAPOX. He has hypersensitive reaction to oxaliplatin (difficulty breathing), and oxaliplatin was held after cycle 2, changed to Xeloda 1056m/m2 (25057min am and 200048mn pm), 2 weeks on and one week off, for additional 3 cycles         HISTORY OF PRESENTING ILLNESS (04/02/2016):  Jermaine Smith 66o. male is here because of his recently diagnosed rectal adenocarcinoma. He is accompanied by his wife to our multidisciplinary GI clinic today.  He has been having rectal bleeding for the past 4 months, intermittent, small amount, mixed with stool, BM is regular, but smaller caliber, no rectal pain, nausea, bloating, he has lost 5 lbs during his recent colonoscopy procedures, his appetite is normal, has been eating less, since the diagnosis of cancer. His appetite and energy level are normal. He saw gastroenterologist Dr. StaFuller Plan the  past for his GERD, and self-referred back to Dr. Fuller Plan and underwent colonoscopy in early May 2017. The colonoscopy showed a polyp in sigmoid colon, and a fungating mass in the proximal rectum, biopsy showed adenocarcinoma. He underwent EUS Colonoscopy, which showed a T3 N1 disease. CT scan was negative for distant metastasis.  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 He has been very healthy, only takes PPI for acid reflux and  baby aspirin. Exercise regularly. He feels well well overall.  CURRENT THERAPY: Surveillance  INTERIM HISTORY: Jermaine Smith returns for follow up. He has finished chemotherapy 2 weeks ago. He is doing well today. He has gained some of his weight back. His energy is getting back to normal; he occasionally still naps. He hasn't had to wake up and urinate multiple times a night. The tingling in his fingers and toes is still present, but is improving. He drinks Ensure and takes multivitamins daily. Denies any other concerns.   MEDICAL HISTORY:  Past Medical History:  Diagnosis Date  . FHx: brain aneurysm   . GERD (gastroesophageal reflux disease)   . Hiatal hernia   . Left inguinal hernia   . Skin cancer    hx basal cell carcinoma    SURGICAL HISTORY: Past Surgical History:  Procedure Laterality Date  . CLAVICLE SURGERY    . EUS N/A 03/25/2016   Procedure: LOWER ENDOSCOPIC ULTRASOUND (EUS);  Surgeon: Milus Banister, MD;  Location: [facility];  Service: Endoscopy;  Laterality: N/A;  . INGUINAL HERNIA REPAIR  1961   open right inguinal age 66 y/o  . lap bilateral ing. hernia repair Bilateral 05/09/12  . XI ROBOTIC ASSISTED LOWER ANTERIOR RESECTION N/A 08/04/2016   Procedure: XI ROBOTIC ASSISTED LOWER ANTERIOR RESECTION WITH RIGID PROCTOSCOPY;  Surgeon: Leighton Ruff, MD;  Location: [facility];  Service: General;  Laterality: N/A;    SOCIAL HISTORY: Social History   Social History  . Marital status: Married    Spouse name: N/A  . Number of children: 2  . Years of education: N/A   Occupational History  . endoscope specialist [employer]    [place] on [place]  . Smoking status: Former Smoker    Packs/day: 0.50    Years: 5.00    Types: Cigarettes    Quit date: 09/03/1978  . Smokeless tobacco: Never Used  . Alcohol use 0.0 oz/week     Comment: ocassional, once a month   . Drug use: No     Comment: still smoking 1ppd,had  quit in 1978,   . Sexual activity: Not on file   Other Topics Concern  . 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   FAMILY HISTORY: Family History  Problem Relation Age of Onset  . Heart disease Father   . Clotting disorder Mother     PE  . Hypotension Mother   . Colon cancer Neg Hx                              He has two adopted children, 15 and 40 yo  ALLERGIES:  has No Known Allergies.  MEDICATIONS:  Current Outpatient Prescriptions  Medication Sig Dispense Refill  . aspirin  81 MG tablet Take 81 mg by mouth every morning.     . capecitabine (XELODA) 500 MG tablet Take 5 tab in the morning and 4 tab in the evening, on days 1-14, every 21 days. (Patient not taking: Reported on 12/02/2016) 135 tablet 0  . Cholecalciferol (VITAMIN D PO) Take 1 tablet by mouth daily.    Marland Kitchen dexlansoprazole (DEXILANT) 60 MG capsule Take 60 mg by mouth every morning.     . ferrous sulfate 325 (65 FE) MG tablet Take 325 mg by mouth daily with breakfast.    . Multiple Vitamin (MULTIVITAMIN) tablet Take 1 tablet by mouth every morning.     . ondansetron (ZOFRAN) 8 MG tablet Take 1 tablet (8 mg total) by mouth every 8 (eight) hours as needed for nausea. 30 tablet 2  . prochlorperazine (COMPAZINE) 10 MG tablet Take 1 tablet (10 mg total) by mouth every 6 (six) hours as needed for nausea or vomiting. 30 tablet 1  . traMADol (ULTRAM) 50 MG tablet Take 1-2 tablets (50-100 mg total) by mouth every 6 (six) hours as needed for moderate pain or severe pain. 30 tablet 0  . urea 10 % lotion Apply 1 application topically 2 (two) times daily. 237 mL 1   No current facility-administered medications for this visit.     REVIEW OF SYSTEMS:   Constitutional: Denies fevers, chills or abnormal night sweats Eyes: Denies blurriness of vision, double vision or  watery eyes Ears, nose, mouth, throat, and face: Denies mucositis or sore throat Respiratory: Denies cough, dyspnea or wheezes Cardiovascular: Denies palpitation, chest discomfort or lower extremity swelling Gastrointestinal:  Denies nausea, heartburn or change in bowel habits Skin: Denies abnormal skin rashes Lymphatics: Denies new lymphadenopathy or easy bruising Neurological:Denies numbness, new weaknesses (+) tingling fingers and feet, improving  Behavioral/Psych: Mood is stable, no new changes  All other systems were reviewed with the patient and are negative.  PHYSICAL EXAMINATION: ECOG PERFORMANCE STATUS: 1  Vitals:   01/06/17 1012  BP: 134/77  Pulse: 80  Resp: 18  Temp: 97.9 F (36.6 C)   Filed Weights   01/06/17 1012  Weight: 211 lb 3.2 oz (95.8 kg)    GENERAL:alert, no distress and comfortable SKIN: skin color, texture, turgor are normal EYES: normal, conjunctiva are pink and non-injected, sclera clear OROPHARYNX:no exudate, no erythema and lips, buccal mucosa, and tongue normal  NECK: supple, thyroid normal size, non-tender, without nodularity LYMPH:  no palpable lymphadenopathy in the cervical, axillary or inguinal LUNGS: clear to auscultation and percussion with normal breathing effort HEART: regular rate & rhythm and no murmurs and no lower extremity edema ABDOMEN:abdomen soft, non-tender and normal bowel sounds, No organomegaly. Rectal exam was not performed. Musculoskeletal:no cyanosis of digits and no clubbing  PSYCH: alert & oriented x 3 with fluent speech NEURO: no focal motor/sensory deficits  LABORATORY DATA:  I have reviewed the data as listed CBC Latest Ref Rng & Units 01/06/2017 12/02/2016 11/09/2016  WBC 4.0 - 10.3 10e3/uL 3.1(L) 3.9(L) 4.2  Hemoglobin 13.0 - 17.1 g/dL 12.4(L) 12.2(L) 12.7(L)  Hematocrit 38.4 - 49.9 % 34.1(L) 34.7(L) 35.7(L)  Platelets 140 - 400 10e3/uL 106(L) 117(L) 130(L)    CMP Latest Ref Rng & Units 01/06/2017 12/02/2016  11/09/2016  Glucose 70 - 140 mg/dl 131 91 109  BUN 7.0 - 26.0 mg/dL 14.3 15.4 12.6  Creatinine 0.7 - 1.3 mg/dL 1.1 1.0 1.0  Sodium 136 - 145 mEq/L 139 140 138  Potassium 3.5 - 5.1 mEq/L 3.7 4.0  4.4  Chloride 101 - 111 mmol/L - - -  CO2 22 - 29 mEq/L 22 23 24   Calcium 8.4 - 10.4 mg/dL 9.3 9.4 9.7  Total Protein 6.4 - 8.3 g/dL 6.6 6.7 6.9  Total Bilirubin 0.20 - 1.20 mg/dL 1.59(H) 1.42(H) 1.11  Alkaline Phos 40 - 150 U/L 83 87 88  AST 5 - 34 U/L 26 28 29   ALT 0 - 55 U/L 19 18 20     CEA (ng/ml) 03/19/2016: 1.1 04/15/2016: 1.9 08/26/2016: 1.2  12/02/2016: 3.2  CT chest w/ contrast 03/26/16 IMPRESSION: 1. No definite metastatic disease in the chest. The patient has scattered calcified granulomata in both lungs with two very tiny non calcified nodules identified. These noncalcified lesions are also likely benign, but close attention on follow-up recommended.  Diagnosis 03/19/2016 1. Colon, polyp(s), sigmoid - TUBULAR ADENOMA(S). - HIGH GRADE DYSPLASIA IS NOT IDENTIFIED. 2. Rectum, biopsy, mass - INVASIVE ADENOCARCINOMA.  Diagnosis 08/04/2016 1. Colon, segmental resection for tumor, rectosigmoid ADENOCARCINOMA OF THE RECTUM, GRADE 2, THE TUMOR INVADES MUSCULARIS PROPRIA ALL MARGINS OF RESECTION ARE NEGATIVE FOR CARCINOMA METASTATIC ADENOCARCINOMA WITH TREATMENT EFFECT IN TWO LYMPH NODES (2/4) 2. Colon, resection margin (donut), final distal BENIGN COLONIC TISSUE Microscopic Comment 1. COLON AND RECTUM (INCLUDING TRANS-ANAL RESECTION): Specimen: Rectosigmoid colon Procedure: Segmental resection Tumor site: Rectum Specimen integrity: Intact Macroscopic intactness of mesorectum: Not applicable: NA Complete: x Near complete: NA Incomplete: NA Cannot be determined (specify): NA Macroscopic tumor perforation: Muscularis propria Invasive tumor: Maximum size: 2.1 cm Histologic type(s): Adenocarcinoma Histologic grade and differentiation: G1: well differentiated/low grade G2:  moderately differentiated/low grade G3: poorly differentiated/high grade G4: undifferentiated/high grade Type of polyp in which invasive carcinoma arose: Tubular adenoma Microscopic extension of invasive tumor: Muscularis propria Lymph-Vascular invasion: Negative Peri-neural invasion: Negative Tumor deposit(s) (discontinuous extramural extension): Negative Resection margins: Proximal margin: Negative Distal margin: Negative Circumferential (radial) (posterior ascending, posterior descending; lateral and posterior mid-rectum; and entire lower 1/3 rectum):Negative Mesenteric margin (sigmoid and transverse): Negative Distance closest margin (if all above margins negative): 3.2 cm Trans-anal resection margins only: Deep margin: NA Mucosal Margin: NA Distance closest mucosal margin (if negative): NA Treatment effect (neo-adjuvant therapy): Partial Additional polyp(s): Negative Non-neoplastic findings: Unremarkable Lymph nodes: number examined 4; number positive: 2 Pathologic Staging: ypT2, ypN1, Mx Ancillary studies: Per request   RADIOGRAPHIC STUDIES: I have personally reviewed the radiological images as listed and agreed with the findings in the report. No results found.   EUS by Dr. Ardis Hughs 03/25/2016 Endosonographic Finding 1. The mass above correlated with a hyoechoic mass that clearly invades into and through the muscularis propria layer of the rectal wall (uT3). 2. The was one small (63m) suspicious perirectal lymphnode that was suspicious for malignant involvement (uN1) 3. The right obturator lymphnode that was described on recent CT scan was not visible on this exam 5cm long, non-circumferential uT3N1 (stage IIIb) proximal rectal adenocarcinoma with distal edge located 9-10cm from the anal verge. Following EUS staging, the mass was labeled with submucosal injection of SPOT.  COLONOSCOPY by Dr. SFuller Plan5/03/2016 IMPRESSION: - Malignant partially obstructing tumor in the  proximal rectum. Biopsied. - One 10 mm polyp in the sigmoid colon, removed with a hot snare. Resected and retrieved. - Internal hemorrhoids.   ASSESSMENT & PLAN: 66y.o. male, presented with mild intermittent rectal bleeding for 4 months.  1. Proximal rectal adenocarcinoma, uT3N1M0, stag IIIB, ypT2N1M0,  MMR normal -I previously discussed his CT scan finding, colonoscopy and biopsy results in great details with patient and  his wife. -Staging results was discussed with him also, the risk of cancer recurrence for stage III rectal cancer is high, the standard care is neoadjuvant chemotherapy and radiation, followed by surgery and adjuvant chemotherapy. This was discussed with him in great details -He has completed neoadjuvant chemoradiation with Xeloda, tolerated well overall. -I previously reviewed his surgical pathology findings. He had a partial response to neoadjuvant chemoradiation, still has significant residual disease, especially with 2 positive lymph nodes (out of 4) -We previously discussed the role of adjuvant chemotherapy after surgery. Giving the significant residual disease, especially positive lymph nodes, I recommend him to have adjuvant chemotherapy with FOLFOX or CAPOX. He opted CAPOX, for a total of 4 cycles.  -due to his moderate side effects from oxaliplatin,  especially hypersensitive reaction after second infusion, oxaliplatin has been held after cycle 2, he has completed adjuvant chemo now  -Lab results reviewed -I previously discussed the risk of cancer recurrence in the future. I discussed the surveillance plan, which is a physical exam and lab test (including CBC, CMP and CEA) every 3 months for the first 2 years, then every 6-12 months, colonoscopy in one year, and surveilliance CT scan every 6-12 month for up to 5 year. We plan to repeat colonoscopy in a CT scan in May. -He has been eating well again and has gained some weight back. Now that he is done chemo, I have  encouraged him to eat a healthier diet.  -Order CT in 3 months.  -He sees Dr. Marcello Moores next month. He will see me in 3 months, then Dr. Marcello Moores again 3 months after.  -Colonoscopy in August 2018, once he has medicare.   2. Iron deficient anemia from rectal bleeding. -He had mild anemia with hemoglobin 11.9 when he was diagnosed with rectal cancer. -His iron study showed low ferritin and transferrin saturation, consistent with iron deficiency. This is from his rectal bleeding. -He has started ferrous sulfate twice daily, I encouraged him to take the pill with orange juice or vitamin C after meal, he is tolerating well. He is on once daily -Hgb 12.4 today, improving -Continue vitamin D.  -Continue iron for 3-4 more months.   3. Fatigue  -secondary to chemo, much improved lately  -I encouraged him to gradually increase his activity level and exercise, after he completes chemotherapy. Resolved since finishing chemo.   4. Pancytopenia -Secondary to chemotherapy, mild overall -We'll continue monitoring   5. Hyperbilirubinemia  -Likely secondary to chemotherapy Xeloda, mild -I discuss the signs of jaundice and suggest pt to watch  -repeat lab on next visit   Plan -I will see him back in 3 months with lab and CT scan a few days before    All questions were answered. The patient knows to call the clinic with any problems, questions or concerns.  I spent 25 minutes counseling the patient face to face. The total time spent in the appointment was 30 minutes and more than 50% was on counseling.  This document serves as a record of services personally performed by Truitt Merle, MD. It was created on her behalf by Martinique Casey, a trained medical scribe. The creation of this record is based on the scribe's personal observations and the provider's statements to them. This document has been checked and approved by the attending provider.  I have reviewed the above documentation for accuracy and  completeness and I agree with the above.   Martinique M Casey 01/06/2017

## 2017-01-06 ENCOUNTER — Telehealth: Payer: Self-pay | Admitting: Hematology

## 2017-01-06 ENCOUNTER — Encounter: Payer: Self-pay | Admitting: Hematology

## 2017-01-06 ENCOUNTER — Other Ambulatory Visit (HOSPITAL_BASED_OUTPATIENT_CLINIC_OR_DEPARTMENT_OTHER): Payer: BLUE CROSS/BLUE SHIELD

## 2017-01-06 ENCOUNTER — Ambulatory Visit (HOSPITAL_BASED_OUTPATIENT_CLINIC_OR_DEPARTMENT_OTHER): Payer: BLUE CROSS/BLUE SHIELD | Admitting: Hematology

## 2017-01-06 VITALS — BP 134/77 | HR 80 | Temp 97.9°F | Resp 18 | Ht 76.0 in | Wt 211.2 lb

## 2017-01-06 DIAGNOSIS — D61818 Other pancytopenia: Secondary | ICD-10-CM | POA: Diagnosis not present

## 2017-01-06 DIAGNOSIS — R530 Neoplastic (malignant) related fatigue: Secondary | ICD-10-CM

## 2017-01-06 DIAGNOSIS — D500 Iron deficiency anemia secondary to blood loss (chronic): Secondary | ICD-10-CM

## 2017-01-06 DIAGNOSIS — C20 Malignant neoplasm of rectum: Secondary | ICD-10-CM

## 2017-01-06 DIAGNOSIS — D509 Iron deficiency anemia, unspecified: Secondary | ICD-10-CM

## 2017-01-06 LAB — COMPREHENSIVE METABOLIC PANEL
ALK PHOS: 83 U/L (ref 40–150)
ALT: 19 U/L (ref 0–55)
ANION GAP: 9 meq/L (ref 3–11)
AST: 26 U/L (ref 5–34)
Albumin: 3.9 g/dL (ref 3.5–5.0)
BUN: 14.3 mg/dL (ref 7.0–26.0)
CO2: 22 meq/L (ref 22–29)
Calcium: 9.3 mg/dL (ref 8.4–10.4)
Chloride: 109 mEq/L (ref 98–109)
Creatinine: 1.1 mg/dL (ref 0.7–1.3)
EGFR: 74 mL/min/{1.73_m2} — AB (ref >90)
Glucose: 131 mg/dl (ref 70–140)
Potassium: 3.7 mEq/L (ref 3.5–5.1)
Sodium: 139 mEq/L (ref 136–145)
TOTAL PROTEIN: 6.6 g/dL (ref 6.4–8.3)
Total Bilirubin: 1.59 mg/dL — ABNORMAL HIGH (ref 0.20–1.20)

## 2017-01-06 LAB — CBC WITH DIFFERENTIAL/PLATELET
BASO%: 0.3 % (ref 0.0–2.0)
BASOS ABS: 0 10*3/uL (ref 0.0–0.1)
EOS ABS: 0.1 10*3/uL (ref 0.0–0.5)
EOS%: 1.9 % (ref 0.0–7.0)
HCT: 34.1 % — ABNORMAL LOW (ref 38.4–49.9)
HGB: 12.4 g/dL — ABNORMAL LOW (ref 13.0–17.1)
LYMPH%: 16.9 % (ref 14.0–49.0)
MCH: 34.6 pg — AB (ref 27.2–33.4)
MCHC: 36.4 g/dL — ABNORMAL HIGH (ref 32.0–36.0)
MCV: 95.3 fL (ref 79.3–98.0)
MONO#: 0.5 10*3/uL (ref 0.1–0.9)
MONO%: 14.6 % — ABNORMAL HIGH (ref 0.0–14.0)
NEUT%: 66.3 % (ref 39.0–75.0)
NEUTROS ABS: 2 10*3/uL (ref 1.5–6.5)
PLATELETS: 106 10*3/uL — AB (ref 140–400)
RBC: 3.58 10*6/uL — AB (ref 4.20–5.82)
RDW: 15.6 % — ABNORMAL HIGH (ref 11.0–14.6)
WBC: 3.1 10*3/uL — AB (ref 4.0–10.3)
lymph#: 0.5 10*3/uL — ABNORMAL LOW (ref 0.9–3.3)

## 2017-01-06 LAB — FERRITIN: Ferritin: 80 ng/ml (ref 22–316)

## 2017-01-06 NOTE — Telephone Encounter (Signed)
Appointments scheduled per 2/22 LOS. Patient given AVS report and calendars with future scheduled appointments. °

## 2017-02-09 ENCOUNTER — Encounter: Payer: Self-pay | Admitting: Gastroenterology

## 2017-04-04 ENCOUNTER — Other Ambulatory Visit: Payer: BLUE CROSS/BLUE SHIELD

## 2017-04-14 ENCOUNTER — Telehealth: Payer: Self-pay | Admitting: *Deleted

## 2017-04-14 ENCOUNTER — Other Ambulatory Visit: Payer: BLUE CROSS/BLUE SHIELD

## 2017-04-14 ENCOUNTER — Ambulatory Visit: Payer: BLUE CROSS/BLUE SHIELD | Admitting: Hematology

## 2017-04-14 NOTE — Telephone Encounter (Signed)
Spoke with wife Gwyndolyn Saxon and was informed that pt could not do CT scans until after June 16, 2017.  Stated pt will be on Medicare after this date, and can do CT scans as ordered.  Asked Gwyndolyn Saxon how pt is feeling;  Gwyndolyn Saxon stated pt feeling fine, no issues at present.  Informed Gwyndolyn Saxon that CT scans, lab, and office visit will be rescheduled in August.   Gwyndolyn Saxon understood to call office back if pt has any issues or problems prior to  August. Per Gwyndolyn Saxon, pt will have to pay a lot  out of pocket deductible now - which pt could not afford at present.

## 2017-06-03 ENCOUNTER — Telehealth: Payer: Self-pay | Admitting: Hematology

## 2017-06-03 NOTE — Telephone Encounter (Signed)
R/s appt per 7/20 sch message from [facility] - patient is aware.

## 2017-06-20 ENCOUNTER — Other Ambulatory Visit (HOSPITAL_BASED_OUTPATIENT_CLINIC_OR_DEPARTMENT_OTHER): Payer: Medicare Other

## 2017-06-20 ENCOUNTER — Ambulatory Visit (HOSPITAL_COMMUNITY)
Admission: RE | Admit: 2017-06-20 | Discharge: 2017-06-20 | Disposition: A | Discharge status: Home / Self Care | Payer: Medicare Other | Source: Ambulatory Visit | Attending: Hematology | Admitting: Hematology

## 2017-06-20 DIAGNOSIS — R938 Abnormal findings on diagnostic imaging of other specified body structures: Secondary | ICD-10-CM | POA: Diagnosis not present

## 2017-06-20 DIAGNOSIS — K449 Diaphragmatic hernia without obstruction or gangrene: Secondary | ICD-10-CM | POA: Insufficient documentation

## 2017-06-20 DIAGNOSIS — R918 Other nonspecific abnormal finding of lung field: Secondary | ICD-10-CM | POA: Diagnosis not present

## 2017-06-20 DIAGNOSIS — C20 Malignant neoplasm of rectum: Secondary | ICD-10-CM

## 2017-06-20 DIAGNOSIS — D509 Iron deficiency anemia, unspecified: Secondary | ICD-10-CM

## 2017-06-20 DIAGNOSIS — I700 Atherosclerosis of aorta: Secondary | ICD-10-CM | POA: Diagnosis not present

## 2017-06-20 DIAGNOSIS — Z9889 Other specified postprocedural states: Secondary | ICD-10-CM | POA: Diagnosis not present

## 2017-06-20 LAB — COMPREHENSIVE METABOLIC PANEL
ALBUMIN: 4.1 g/dL (ref 3.5–5.0)
ALK PHOS: 104 U/L (ref 40–150)
ALT: 25 U/L (ref 0–55)
AST: 27 U/L (ref 5–34)
Anion Gap: 7 mEq/L (ref 3–11)
BILIRUBIN TOTAL: 1.06 mg/dL (ref 0.20–1.20)
BUN: 17.1 mg/dL (ref 7.0–26.0)
CALCIUM: 9.8 mg/dL (ref 8.4–10.4)
CO2: 24 mEq/L (ref 22–29)
CREATININE: 1.1 mg/dL (ref 0.7–1.3)
Chloride: 107 mEq/L (ref 98–109)
EGFR: 71 mL/min/{1.73_m2} — ABNORMAL LOW (ref >90)
Glucose: 101 mg/dl (ref 70–140)
Potassium: 4.3 mEq/L (ref 3.5–5.1)
Sodium: 138 mEq/L (ref 136–145)
TOTAL PROTEIN: 7.1 g/dL (ref 6.4–8.3)

## 2017-06-20 LAB — CBC WITH DIFFERENTIAL/PLATELET
BASO%: 1.1 % (ref 0.0–2.0)
Basophils Absolute: 0 10e3/uL (ref 0.0–0.1)
EOS%: 1.7 % (ref 0.0–7.0)
Eosinophils Absolute: 0.1 10e3/uL (ref 0.0–0.5)
HCT: 41.6 % (ref 38.4–49.9)
HGB: 14.6 g/dL (ref 13.0–17.1)
LYMPH%: 17.5 % (ref 14.0–49.0)
MCH: 30.4 pg (ref 27.2–33.4)
MCHC: 35 g/dL (ref 32.0–36.0)
MCV: 86.6 fL (ref 79.3–98.0)
MONO#: 0.5 10e3/uL (ref 0.1–0.9)
MONO%: 12 % (ref 0.0–14.0)
NEUT#: 2.9 10e3/uL (ref 1.5–6.5)
NEUT%: 67.7 % (ref 39.0–75.0)
Platelets: 145 10e3/uL (ref 140–400)
RBC: 4.8 10e6/uL (ref 4.20–5.82)
RDW: 13.7 % (ref 11.0–14.6)
WBC: 4.2 10e3/uL (ref 4.0–10.3)
lymph#: 0.7 10e3/uL — ABNORMAL LOW (ref 0.9–3.3)

## 2017-06-20 LAB — IRON AND TIBC
%SAT: 36 % (ref 20–55)
Iron: 102 ug/dL (ref 42–163)
TIBC: 286 ug/dL (ref 202–409)
UIBC: 184 ug/dL (ref 117–376)

## 2017-06-20 LAB — FERRITIN: FERRITIN: 92 ng/mL (ref 22–316)

## 2017-06-20 LAB — CEA (IN HOUSE-CHCC): CEA (CHCC-In House): 1.31 ng/mL (ref 0.00–5.00)

## 2017-06-20 MED ORDER — IOPAMIDOL (ISOVUE-300) INJECTION 61%
INTRAVENOUS | Status: AC
  Administered 2017-06-20: 100 mL
  Filled 2017-06-20: qty 100

## 2017-06-23 ENCOUNTER — Other Ambulatory Visit: Payer: BLUE CROSS/BLUE SHIELD

## 2017-06-24 ENCOUNTER — Telehealth: Payer: Self-pay | Admitting: *Deleted

## 2017-06-24 NOTE — Telephone Encounter (Signed)
Called pt & informed of good CT scan results per Dr Ernestina Penna instructions & reminded of 06/30/17 appt.  Pt expressed understanding.

## 2017-06-24 NOTE — Telephone Encounter (Signed)
-----   Message from Truitt Merle, MD sent at 06/21/2017  1:50 PM EDT ----- Please let pt know that his CT scan looks good, no concerns for cancer recurrence. I will see him back on 06/30/17 as it's scheduled. Thanks.   Truitt Merle  06/21/2017 d

## 2017-06-29 NOTE — Progress Notes (Signed)
[employer]  [phone] [phone]  Clinic Follow Up Note   Patient Care Team: Maury Dus, MD as PCP - General (Family Medicine) Ladene Artist, MD as Consulting Physician (Gastroenterology) Leighton Ruff, MD as Consulting Physician (General Surgery) Truitt Merle, MD as Consulting Physician (Hematology) Kyung Rudd, MD as Consulting Physician (Radiation Oncology) Tania Ade, RN as Registered Nurse Michael Boston, MD as Consulting Physician (General Surgery) 06/30/2017   CHIEF COMPLAINTS:  Follow up proximal rectal cancer  Oncology History   Cancer Staging Rectal cancer ypT2ypN1cM0 s/p robotic LAR 08/04/2016 Staging form: Colon and Rectum, AJCC 7th Edition - Clinical stage from 03/19/2016: Stage IIIB (T3, N1, M0) - Signed by Truitt Merle, MD on 04/02/2016 - Pathologic stage from 08/04/2016: Stage IIIA (T2, N1b, cM0) - Signed by Truitt Merle, MD on 08/26/2016        Rectal cancer ypT2ypN1cM0 s/p robotic LAR 08/04/2016   03/19/2016 Initial Diagnosis    Rectal cancer ([place])      03/19/2016 Initial Biopsy    Rectal mass biopsy showed invasive adenocarcinoma. Sigmoid colon polyps showed tubular adenoma.       03/19/2016 Procedure    Colonoscopy showed a fungating partially obstructing mass in the proximal rectum, partially circumferential involving two thirds of the lumen circumference, measuring 6 x 4 cm, 10-16 cm from the anal verge. A 10 mm polyps was found in the sigmoid colon.      03/22/2016 Tumor Marker    CEA=1.1      03/23/2016 Imaging    CT chest, abdomen and pelvis with contrast showed asymmetric rectal mass approximately 10 cm from the anus, 2 suspicious lymph nodes in the deep pelvis concerning for local nodal metastasis. No other evidence of metastatic disease.      03/25/2016 Procedure    EUS showed a T3 proximal rectal mass, there was one small 6 mm suspicious for rectal node that was suspicious for malignancy (uN1)      04/14/2016 -  05/24/2016 Chemotherapy    Xeloda 1800 mg every 12 hours, Monday through Friday, with concurrent radiation      04/14/2016 - 05/24/2016 Radiation Therapy     Adjuvant irradiation to his rectal cancer.      08/04/2016 Surgery    Robotic-assisted low anterior resection of rectal cancer.      08/04/2016 Pathology Results    Rectosigmoid segmental resection showed adenocarcinoma of the rectum, grade 2, tumor invades muscularis propria, margins were negative. Metastatic adenocarcinoma was seen treatment effect in 2 lymph nodes, a total of 4 nodes.        09/09/2016 - 12/23/2016 Chemotherapy    Adjuvant chemo with CAPOX. He has hypersensitive reaction to oxaliplatin (difficulty breathing), and oxaliplatin was held after cycle 2, changed to Xeloda 1019m/m2 (25014min am and 200075mn pm), 2 weeks on and one week off, for additional 3 cycles        06/20/2017 Imaging    CT CAP W Contrast 06/20/17  IMPRESSION: 1. Post therapy related findings in the pelvis including anastomotic staple line in the rectum, along with perirectal and presacral density likely the result of prior radiation therapy. 2. Oval-shaped right inferior gluteal lymph node 1.8 by 1.3 cm, formerly 1.5 by 2.3 cm. 3. Mildly prominent in size appendix, somewhat low-density, appendiceal tip diameter 1.0 cm. Difficult to exclude appendiceal mucocele, surveillance versus appendectomy suggested. 4. New lucency along the right posterior inferior margin of the L4 vertebral body, probably a Schmorl's node given the contiguity  with the disc. 5. Small to moderate-sized type 1 hiatal hernia. 6.  Aortic Atherosclerosis (ICD10-I70.0).       HISTORY OF PRESENTING ILLNESS (04/02/2016):  Jermaine Smith 66 y.o. male is here because of his recently diagnosed rectal adenocarcinoma. He is accompanied by his wife to our multidisciplinary GI clinic today.  He has been having rectal bleeding for the past 4 months, intermittent, small amount, mixed with  stool, BM is regular, but smaller caliber, no rectal pain, nausea, bloating, he has lost 5 lbs during his recent colonoscopy procedures, his appetite is normal, has been eating less, since the diagnosis of cancer. His appetite and energy level are normal. He saw gastroenterologist Dr. Fuller Plan in the past for his GERD, and self-referred back to Dr. Fuller Plan and underwent colonoscopy in early May 2017. The colonoscopy showed a polyp in sigmoid colon, and a fungating mass in the proximal rectum, biopsy showed adenocarcinoma. He underwent EUS Colonoscopy, which showed a T3 N1 disease. CT scan was negative for distant metastasis.  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 He has been very healthy, only takes PPI for acid reflux and baby aspirin. Exercise regularly. He feels well well overall.  CURRENT THERAPY: Surveillance  INTERIM HISTORY:  Mr. Neenan returns for follow up. He presents to the clinic today with his wife. He reports to tingling in his toes but it is improving. His fingers are overall back to normal. He is eating well and he gained 8 pounds and near his normal weight. He reports his energy is back to normal. He just had a birthday 2 weeks ago. He is still busy with work. His BM is not loose and he overall just goes more frequently due to his shorter colon. He is getting use to it. He has had no accident but sometimes is worried it can occur. He denies pain in appendix and lumbar regions.    MEDICAL HISTORY:  Past Medical History:  Diagnosis Date  . FHx: brain aneurysm   . GERD (gastroesophageal reflux disease)   . Hiatal hernia   . Left inguinal hernia   . Skin cancer    hx basal cell carcinoma    SURGICAL HISTORY: Past Surgical History:  Procedure Laterality Date  . CLAVICLE SURGERY    . EUS N/A 03/25/2016   Procedure: LOWER ENDOSCOPIC ULTRASOUND (EUS);  Surgeon: Milus Banister, MD;  Location: [facility];  Service: Endoscopy;  Laterality: N/A;  .  INGUINAL HERNIA REPAIR  1961   open right inguinal age 66 y/o  . lap bilateral ing. hernia repair Bilateral 05/09/12  . XI ROBOTIC ASSISTED LOWER ANTERIOR RESECTION N/A 08/04/2016   Procedure: XI ROBOTIC ASSISTED LOWER ANTERIOR RESECTION WITH RIGID PROCTOSCOPY;  Surgeon: Leighton Ruff, MD;  Location: [facility];  Service: General;  Laterality: N/A;    SOCIAL HISTORY: Social History   Social History  . Marital status: Married    Spouse name: N/A  . Number of children: 2  . Years of education: N/A   Occupational History  . endoscope specialist [employer]    Works on [employer]  . Smoking status: Former Smoker    Packs/day: 0.50    Years: 5.00    Types: Cigarettes    Quit date: 09/03/1978  . Smokeless tobacco: Never Used  . Alcohol use 0.0 oz/week     Comment: ocassional, once a month   . Drug use: No  Comment: still smoking 1ppd,had quit in 1978,   . Sexual activity: Not on file   Other Topics Concern  . 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   FAMILY HISTORY: Family History  Problem Relation Age of Onset  . Heart disease Father   . Clotting disorder Mother        PE  . Hypotension Mother   . Colon cancer Neg Hx                              He has two adopted children, 23 and 40 yo  ALLERGIES:  has No Known Allergies.  MEDICATIONS:  Current Outpatient Prescriptions  Medication Sig Dispense Refill  . aspirin 81 MG tablet Take 81 mg by mouth every morning.     . Cholecalciferol (VITAMIN D PO) Take 1 tablet by mouth daily.    Marland Kitchen dexlansoprazole (DEXILANT) 60 MG capsule Take 60 mg by mouth every morning.     . ferrous sulfate 325 (65 FE) MG tablet Take 325 mg by mouth daily with breakfast.    . Multiple Vitamin (MULTIVITAMIN) tablet Take 1  tablet by mouth every morning.      No current facility-administered medications for this visit.     REVIEW OF SYSTEMS:   Constitutional: Denies fevers, chills or abnormal night sweats (+) purposeful weight gain  Eyes: Denies blurriness of vision, double vision or watery eyes Ears, nose, mouth, throat, and face: Denies mucositis or sore throat Respiratory: Denies cough, dyspnea or wheezes Cardiovascular: Denies palpitation, chest discomfort or lower extremity swelling Gastrointestinal:  Denies nausea, heartburn or change in bowel habits Skin: Denies abnormal skin rashes Lymphatics: Denies new lymphadenopathy or easy bruising Neurological:Denies numbness, new weaknesses (+) tingling fingers, resolved and feet, improving Behavioral/Psych: Mood is stable, no new changes  All other systems were reviewed with the patient and are negative.  PHYSICAL EXAMINATION: ECOG PERFORMANCE STATUS: 1  Vitals:   06/30/17 1023  BP: (!) 160/92  Pulse: 81  Resp: 20  Temp: 98.2 F (36.8 C)  SpO2: 99%   Filed Weights   06/30/17 1023  Weight: 219 lb 8 oz (99.6 kg)     GENERAL:alert, no distress and comfortable SKIN: skin color, texture, turgor are normal EYES: normal, conjunctiva are pink and non-injected, sclera clear OROPHARYNX:no exudate, no erythema and lips, buccal mucosa, and tongue normal  NECK: supple, thyroid normal size, non-tender, without nodularity LYMPH:  no palpable lymphadenopathy in the cervical, axillary or inguinal LUNGS: clear to auscultation and percussion with normal breathing effort HEART: regular rate & rhythm and no murmurs and no lower extremity edema ABDOMEN: abdomen soft, non-tender and normal bowel sounds, No organomegaly. Rectal exam was not performed. Musculoskeletal:no cyanosis of digits and no clubbing  PSYCH: alert & oriented x 3 with fluent speech NEURO: no focal motor/sensory deficits  LABORATORY DATA:  I have reviewed the data as listed CBC Latest Ref Rng  & Units 06/20/2017 01/06/2017 12/02/2016  WBC 4.0 - 10.3 10e3/uL 4.2 3.1(L) 3.9(L)  Hemoglobin 13.0 - 17.1 g/dL 14.6 12.4(L) 12.2(L)  Hematocrit 38.4 - 49.9 % 41.6 34.1(L) 34.7(L)  Platelets 140 - 400 10e3/uL 145 106(L) 117(L)    CMP Latest Ref Rng & Units 06/20/2017 01/06/2017 12/02/2016  Glucose 70 - 140 mg/dl  101 131 91  BUN 7.0 - 26.0 mg/dL 17.1 14.3 15.4  Creatinine 0.7 - 1.3 mg/dL 1.1 1.1 1.0  Sodium 136 - 145 mEq/L 138 139 140  Potassium 3.5 - 5.1 mEq/L 4.3 3.7 4.0  Chloride 101 - 111 mmol/L - - -  CO2 22 - 29 mEq/L 24 22 23   Calcium 8.4 - 10.4 mg/dL 9.8 9.3 9.4  Total Protein 6.4 - 8.3 g/dL 7.1 6.6 6.7  Total Bilirubin 0.20 - 1.20 mg/dL 1.06 1.59(H) 1.42(H)  Alkaline Phos 40 - 150 U/L 104 83 87  AST 5 - 34 U/L 27 26 28   ALT 0 - 55 U/L 25 19 18     CEA (ng/ml) 03/19/2016: 1.1 04/15/2016: 1.9 08/26/2016: 1.2  12/02/2016: 3.2 06/20/17: 1.31   Diagnosis 03/19/2016 1. Colon, polyp(s), sigmoid - TUBULAR ADENOMA(S). - HIGH GRADE DYSPLASIA IS NOT IDENTIFIED. 2. Rectum, biopsy, mass - INVASIVE ADENOCARCINOMA.  Diagnosis 08/04/2016 1. Colon, segmental resection for tumor, rectosigmoid ADENOCARCINOMA OF THE RECTUM, GRADE 2, THE TUMOR INVADES MUSCULARIS PROPRIA ALL MARGINS OF RESECTION ARE NEGATIVE FOR CARCINOMA METASTATIC ADENOCARCINOMA WITH TREATMENT EFFECT IN TWO LYMPH NODES (2/4) 2. Colon, resection margin (donut), final distal BENIGN COLONIC TISSUE Microscopic Comment 1. COLON AND RECTUM (INCLUDING TRANS-ANAL RESECTION): Specimen: Rectosigmoid colon Procedure: Segmental resection Tumor site: Rectum Specimen integrity: Intact Macroscopic intactness of mesorectum: Not applicable: NA Complete: x Near complete: NA Incomplete: NA Cannot be determined (specify): NA Macroscopic tumor perforation: Muscularis propria Invasive tumor: Maximum size: 2.1 cm Histologic type(s): Adenocarcinoma Histologic grade and differentiation: G1: well differentiated/low grade G2: moderately  differentiated/low grade G3: poorly differentiated/high grade G4: undifferentiated/high grade Type of polyp in which invasive carcinoma arose: Tubular adenoma Microscopic extension of invasive tumor: Muscularis propria Lymph-Vascular invasion: Negative Peri-neural invasion: Negative Tumor deposit(s) (discontinuous extramural extension): Negative Resection margins: Proximal margin: Negative Distal margin: Negative Circumferential (radial) (posterior ascending, posterior descending; lateral and posterior mid-rectum; and entire lower 1/3 rectum):Negative Mesenteric margin (sigmoid and transverse): Negative Distance closest margin (if all above margins negative): 3.2 cm Trans-anal resection margins only: Deep margin: NA Mucosal Margin: NA Distance closest mucosal margin (if negative): NA Treatment effect (neo-adjuvant therapy): Partial Additional polyp(s): Negative Non-neoplastic findings: Unremarkable Lymph nodes: number examined 4; number positive: 2 Pathologic Staging: ypT2, ypN1, Mx Ancillary studies: Per request   RADIOGRAPHIC STUDIES: I have personally reviewed the radiological images as listed and agreed with the findings in the report. Ct Chest W Contrast  Result Date: 06/20/2017 CLINICAL DATA:  Restaging/ surveillance of rectal cancer diagnosed may 2017 with resection, completed chemotherapy and radiation therapy. EXAM: CT CHEST, ABDOMEN, AND PELVIS WITH CONTRAST TECHNIQUE: Multidetector CT imaging of the chest, abdomen and pelvis was performed following the standard protocol during bolus administration of intravenous contrast. CONTRAST:  13m ISOVUE-300 IOPAMIDOL (ISOVUE-300) INJECTION 61% COMPARISON:  03/26/2016 and 03/23/2016 FINDINGS: CT CHEST FINDINGS Cardiovascular: Atherosclerotic calcification of the aortic arch and descending thoracic aorta. Mediastinum/Nodes: Small to moderate-sized type 1 hiatal hernia. No pathologic thoracic adenopathy. Lungs/Pleura: Biapical  pleuroparenchymal scarring. Several calcified granulomas are observed, most notably in the right lower lobe and lingula. Musculoskeletal: Degenerative subcortical cyst formation inferiorly along the right glenoid, similar to prior. Multiple thoracic hemangiomas including at the T4, T5, T6, and T9 levels. CT ABDOMEN PELVIS FINDINGS Hepatobiliary: Unremarkable Pancreas: Unremarkable Spleen: Unremarkable Adrenals/Urinary Tract: Stable hypodense lesion of the left kidney upper pole, probably a cyst but technically too small to characterize. Small bilateral peripelvic cysts. Adrenal glands normal. Stomach/Bowel: Anastomotic staple line in the rectum. Perirectal stranding and presacral  soft tissue density, likely both secondary to radiation therapy related findings. In the right hemipelvis an oval-shaped lesion measuring 1.3 by 1.8 cm on image 122/2 previously measured 1.5 by 2.3 cm. Mildly prominent appendix, somewhat low-density, appendiceal diameter at the tip 1.0 cm, previously 0.9 cm. No periappendiceal stranding. Vascular/Lymphatic: Aortoiliac atherosclerotic vascular disease. No pathologic adenopathy. Reproductive: Mildly indistinct margins of the prostate gland likely due to regional radiation therapy. Other: Anterior pelvic hernia mesh. Musculoskeletal: Suspected bone island in the L3 vertebral measuring up to 1.4 cm. Spondylosis and degenerative disc disease at L5-S1. New lucency along the right posterior L4 vertebral body, 1 cm in diameter, extends to the posterior inferior vertebral margin, probably a small Schmorl's node. IMPRESSION: 1. Post therapy related findings in the pelvis including anastomotic staple line in the rectum, along with perirectal and presacral density likely the result of prior radiation therapy. 2. Oval-shaped right inferior gluteal lymph node 1.8 by 1.3 cm, formerly 1.5 by 2.3 cm. 3. Mildly prominent in size appendix, somewhat low-density, appendiceal tip diameter 1.0 cm. Difficult to  exclude appendiceal mucocele, surveillance versus appendectomy suggested. 4. New lucency along the right posterior inferior margin of the L4 vertebral body, probably a Schmorl's node given the contiguity with the disc. 5. Small to moderate-sized type 1 hiatal hernia. 6.  Aortic Atherosclerosis (ICD10-I70.0). Electronically Signed   By: Van Clines M.D.   On: 06/20/2017 14:23   Ct Abdomen Pelvis W Contrast  Result Date: 06/20/2017 CLINICAL DATA:  Restaging/ surveillance of rectal cancer diagnosed may 2017 with resection, completed chemotherapy and radiation therapy. EXAM: CT CHEST, ABDOMEN, AND PELVIS WITH CONTRAST TECHNIQUE: Multidetector CT imaging of the chest, abdomen and pelvis was performed following the standard protocol during bolus administration of intravenous contrast. CONTRAST:  156m ISOVUE-300 IOPAMIDOL (ISOVUE-300) INJECTION 61% COMPARISON:  03/26/2016 and 03/23/2016 FINDINGS: CT CHEST FINDINGS Cardiovascular: Atherosclerotic calcification of the aortic arch and descending thoracic aorta. Mediastinum/Nodes: Small to moderate-sized type 1 hiatal hernia. No pathologic thoracic adenopathy. Lungs/Pleura: Biapical pleuroparenchymal scarring. Several calcified granulomas are observed, most notably in the right lower lobe and lingula. Musculoskeletal: Degenerative subcortical cyst formation inferiorly along the right glenoid, similar to prior. Multiple thoracic hemangiomas including at the T4, T5, T6, and T9 levels. CT ABDOMEN PELVIS FINDINGS Hepatobiliary: Unremarkable Pancreas: Unremarkable Spleen: Unremarkable Adrenals/Urinary Tract: Stable hypodense lesion of the left kidney upper pole, probably a cyst but technically too small to characterize. Small bilateral peripelvic cysts. Adrenal glands normal. Stomach/Bowel: Anastomotic staple line in the rectum. Perirectal stranding and presacral soft tissue density, likely both secondary to radiation therapy related findings. In the right hemipelvis an  oval-shaped lesion measuring 1.3 by 1.8 cm on image 122/2 previously measured 1.5 by 2.3 cm. Mildly prominent appendix, somewhat low-density, appendiceal diameter at the tip 1.0 cm, previously 0.9 cm. No periappendiceal stranding. Vascular/Lymphatic: Aortoiliac atherosclerotic vascular disease. No pathologic adenopathy. Reproductive: Mildly indistinct margins of the prostate gland likely due to regional radiation therapy. Other: Anterior pelvic hernia mesh. Musculoskeletal: Suspected bone island in the L3 vertebral measuring up to 1.4 cm. Spondylosis and degenerative disc disease at L5-S1. New lucency along the right posterior L4 vertebral body, 1 cm in diameter, extends to the posterior inferior vertebral margin, probably a small Schmorl's node. IMPRESSION: 1. Post therapy related findings in the pelvis including anastomotic staple line in the rectum, along with perirectal and presacral density likely the result of prior radiation therapy. 2. Oval-shaped right inferior gluteal lymph node 1.8 by 1.3 cm, formerly 1.5 by 2.3 cm. 3.  Mildly prominent in size appendix, somewhat low-density, appendiceal tip diameter 1.0 cm. Difficult to exclude appendiceal mucocele, surveillance versus appendectomy suggested. 4. New lucency along the right posterior inferior margin of the L4 vertebral body, probably a Schmorl's node given the contiguity with the disc. 5. Small to moderate-sized type 1 hiatal hernia. 6.  Aortic Atherosclerosis (ICD10-I70.0). Electronically Signed   By: Van Clines M.D.   On: 06/20/2017 14:23   CT chest w/ contrast 03/26/16 IMPRESSION: 1. No definite metastatic disease in the chest. The patient has scattered calcified granulomata in both lungs with two very tiny non calcified nodules identified. These noncalcified lesions are also likely benign, but close attention on follow-up recommended.    PROCEDURES  EUS by Dr. Ardis Hughs 03/25/2016 Endosonographic Finding 1. The mass above correlated  with a hyoechoic mass that clearly invades into and through the muscularis propria layer of the rectal wall (uT3). 2. The was one small (81m) suspicious perirectal lymphnode that was suspicious for malignant involvement (uN1) 3. The right obturator lymphnode that was described on recent CT scan was not visible on this exam 5cm long, non-circumferential uT3N1 (stage IIIb) proximal rectal adenocarcinoma with distal edge located 9-10cm from the anal verge. Following EUS staging, the mass was labeled with submucosal injection of SPOT.  COLONOSCOPY by Dr. SFuller Plan5/03/2016 IMPRESSION: - Malignant partially obstructing tumor in the proximal rectum. Biopsied. - One 10 mm polyp in the sigmoid colon, removed with a hot snare. Resected and retrieved. - Internal hemorrhoids.   ASSESSMENT & PLAN: 66y.o. male, presented with mild intermittent rectal bleeding for 4 months.  1. Proximal rectal adenocarcinoma, uT3N1M0, stag IIIB, ypT2N1M0,  MMR normal -I previously discussed his CT scan finding, colonoscopy and biopsy results in great details with patient and his wife. -Staging results was discussed with him also, the risk of cancer recurrence for stage III rectal cancer is high, the standard care is neoadjuvant chemotherapy and radiation, followed by surgery and adjuvant chemotherapy. This was discussed with him in great details -He has completed neoadjuvant chemoradiation with Xeloda, tolerated well overall. -I previously reviewed his surgical pathology findings. He had a partial response to neoadjuvant chemoradiation, still has significant residual disease, especially with 2 positive lymph nodes (out of 4) -We previously discussed the role of adjuvant chemotherapy after surgery. Giving the significant residual disease, especially positive lymph nodes, I recommend him to have adjuvant chemotherapy with FOLFOX or CAPOX. He opted CAPOX, for a total of 4 cycles.  -due to his moderate side effects from  oxaliplatin,  especially hypersensitive reaction after second infusion, oxaliplatin has been held after cycle 2, he has completed adjuvant chemo now  ---He is clinically doing well, has recovered very well from chemotherapy, no complaints. CBC and CMP are within normal limits, Exam is unremarkable. There is no clinical concern for recurrence. -I reviewed his surveillance CT scan from 06/20/2017, which showed no evidence of recurrence. The right gluteal lymph node has decreased in size, likely benign. Other incidental findings were discussed with patient.  -He is about 1 year from diagnosis so I will follow up with labs and exams every 4 months and repeat scan in 1 year.  -Colonoscopy in a few months, I'll send a message to his gastroenterologist Dr. SFuller Plan   2. Iron deficient anemia from rectal bleeding. -He had mild anemia with hemoglobin 11.9 when he was diagnosed with rectal cancer. -His iron study showed low ferritin and transferrin saturation, consistent with iron deficiency. This is from his rectal bleeding. -He  has started ferrous sulfate twice daily, I encouraged him to take the pill with orange juice or vitamin C after meal, he is tolerating well. He is on once daily -Hgb 12.4 previously, improving -Continue vitamin D.  -Continue iron for 3-4 more months.  -Hg is 14.6 today (06/30/17) and has been normal lately. He is fine to stop oral iron now.    Plan -Lab and skin reviewed with patient, no evidence of recurrence.  -He is due for colonoscopy, I'll send a message to Dr. Fuller Plan. -I will see him back in 4 months with lab    All questions were answered. The patient knows to call the clinic with any problems, questions or concerns.  I spent 25 minutes counseling the patient face to face. The total time spent in the appointment was 30 minutes and more than 50% was on counseling.  This document serves as a record of services personally performed by Truitt Merle, MD. It was created on her  behalf by Joslyn Devon, a trained medical scribe. The creation of this record is based on the scribe's personal observations and the provider's statements to them. This document has been checked and approved by the attending provider.   I have reviewed the above documentation for accuracy and completeness and I agree with the above.   Truitt Merle, MD 06/30/2017

## 2017-06-30 ENCOUNTER — Ambulatory Visit (HOSPITAL_BASED_OUTPATIENT_CLINIC_OR_DEPARTMENT_OTHER): Payer: Medicare Other | Admitting: Hematology

## 2017-06-30 ENCOUNTER — Telehealth: Payer: Self-pay | Admitting: Hematology

## 2017-06-30 ENCOUNTER — Encounter: Payer: Self-pay | Admitting: Hematology

## 2017-06-30 VITALS — BP 160/92 | HR 81 | Temp 98.2°F | Resp 20 | Ht 76.0 in | Wt 219.5 lb

## 2017-06-30 DIAGNOSIS — C20 Malignant neoplasm of rectum: Secondary | ICD-10-CM

## 2017-06-30 DIAGNOSIS — D500 Iron deficiency anemia secondary to blood loss (chronic): Secondary | ICD-10-CM

## 2017-06-30 NOTE — Telephone Encounter (Signed)
Scheduled appt per 8/16 los - Gave patient AVS and calender per los.  

## 2017-07-26 ENCOUNTER — Other Ambulatory Visit: Payer: Self-pay | Admitting: General Surgery

## 2017-07-26 DIAGNOSIS — Z85048 Personal history of other malignant neoplasm of rectum, rectosigmoid junction, and anus: Secondary | ICD-10-CM

## 2017-08-31 ENCOUNTER — Ambulatory Visit (AMBULATORY_SURGERY_CENTER): Payer: Self-pay

## 2017-08-31 VITALS — Ht 77.0 in | Wt 222.0 lb

## 2017-08-31 DIAGNOSIS — Z85038 Personal history of other malignant neoplasm of large intestine: Secondary | ICD-10-CM

## 2017-08-31 MED ORDER — SUPREP BOWEL PREP KIT 17.5-3.13-1.6 GM/177ML PO SOLN: 1.0000 | Freq: Once | ORAL | 0 refills | Status: AC

## 2017-08-31 NOTE — Progress Notes (Signed)
No allergies to eggs or soy No diet meds No home oxygen No past problems with anesthesia  Declined emmi 

## 2017-09-13 ENCOUNTER — Ambulatory Visit (AMBULATORY_SURGERY_CENTER): Payer: Medicare Other | Admitting: Gastroenterology

## 2017-09-13 ENCOUNTER — Encounter: Payer: Self-pay | Admitting: Gastroenterology

## 2017-09-13 VITALS — BP 147/86 | HR 59 | Temp 97.8°F | Resp 17 | Ht 76.0 in | Wt 219.0 lb

## 2017-09-13 DIAGNOSIS — Z85048 Personal history of other malignant neoplasm of rectum, rectosigmoid junction, and anus: Secondary | ICD-10-CM

## 2017-09-13 DIAGNOSIS — Z85038 Personal history of other malignant neoplasm of large intestine: Secondary | ICD-10-CM | POA: Diagnosis not present

## 2017-09-13 DIAGNOSIS — K219 Gastro-esophageal reflux disease without esophagitis: Secondary | ICD-10-CM | POA: Diagnosis not present

## 2017-09-13 MED ORDER — SODIUM CHLORIDE 0.9 % IV SOLN: 500.0000 mL | INTRAVENOUS | Status: DC

## 2017-09-13 NOTE — Patient Instructions (Signed)
YOU HAD AN ENDOSCOPIC PROCEDURE TODAY AT THE [facility]:   Refer to the procedure report that was given to you for any specific questions about what was found during the examination.  If the procedure report does not answer your questions, please call your gastroenterologist to clarify.  If you requested that your care partner not be given the details of your procedure findings, then the procedure report has been included in a sealed envelope for you to review at your convenience later.  YOU SHOULD EXPECT: Some feelings of bloating in the abdomen. Passage of more gas than usual.  Walking can help get rid of the air that was put into your GI tract during the procedure and reduce the bloating. If you had a lower endoscopy (such as a colonoscopy or flexible sigmoidoscopy) you may notice spotting of blood in your stool or on the toilet paper. If you underwent a bowel prep for your procedure, you may not have a normal bowel movement for a few days.  Please Note:  You might notice some irritation and congestion in your nose or some drainage.  This is from the oxygen used during your procedure.  There is no need for concern and it should clear up in a day or so.  SYMPTOMS TO REPORT IMMEDIATELY:   Following lower endoscopy (colonoscopy or flexible sigmoidoscopy):  Excessive amounts of blood in the stool  Significant tenderness or worsening of abdominal pains  Swelling of the abdomen that is new, acute  Fever of 100F or higher  For urgent or emergent issues, a gastroenterologist can be reached at any hour by calling [phone].   DIET:  We do recommend a small meal at first, but then you may proceed to your regular diet.  Drink plenty of fluids but you should avoid alcoholic beverages for 24 hours.  ACTIVITY:  You should plan to take it easy for the rest of today and you should NOT DRIVE or use heavy machinery until tomorrow (because of the sedation medicines used during the test).     FOLLOW UP: Our staff will call the number listed on your records the next business day following your procedure to check on you and address any questions or concerns that you may have regarding the information given to you following your procedure. If we do not reach you, we will leave a message.  However, if you are feeling well and you are not experiencing any problems, there is no need to return our call.  We will assume that you have returned to your regular daily activities without incident.  If any biopsies were taken you will be contacted by phone or by letter within the next 1-3 weeks.  Please call us at [phone] if you have not heard about the biopsies in 3 weeks.    SIGNATURES/CONFIDENTIALITY: You and/or your care partner have signed paperwork which will be entered into your electronic medical record.  These signatures attest to the fact that that the information above on your After Visit Summary has been reviewed and is understood.  Full responsibility of the confidentiality of this discharge information lies with you and/or your care-partner. 

## 2017-09-13 NOTE — Progress Notes (Signed)
Pt's states no medical or surgical changes since previsit or office visit. 

## 2017-09-13 NOTE — Progress Notes (Signed)
Report to PACU, RN, vss, BBS= Clear.  

## 2017-09-13 NOTE — Op Note (Signed)
[facility] Patient Name: Jermaine Smith Procedure Date: 09/13/2017 10:03 AM MRN: [id number] Endoscopist: Ladene Artist , MD Age: 66 Referring MD:  Date of Birth: [birth year] Gender: Male Account #: [id number] Procedure:                Colonoscopy Indications:              High risk colon cancer surveillance: Personal                            history of rectal cancer Medicines:                Monitored Anesthesia Care Procedure:                Pre-Anesthesia Assessment:                           - Prior to the procedure, a History and Physical                            was performed, and patient medications and                            allergies were reviewed. The patient's tolerance of                            previous anesthesia was also reviewed. The risks                            and benefits of the procedure and the sedation                            options and risks were discussed with the patient.                            All questions were answered, and informed consent                            was obtained. Prior Anticoagulants: The patient has                            taken no previous anticoagulant or antiplatelet                            agents. ASA Grade Assessment: II - A patient with                            mild systemic disease. After reviewing the risks                            and benefits, the patient was deemed in                            satisfactory condition to undergo the procedure.  After obtaining informed consent, the colonoscope                            was passed under direct vision. Throughout the                            procedure, the patient's blood pressure, pulse, and                            oxygen saturations were monitored continuously. The                            Colonoscope was introduced through the anus and                            advanced to the the cecum, identified by                           appendiceal orifice and ileocecal valve. The                            ileocecal valve, appendiceal orifice, and rectum                            were photographed. The quality of the bowel                            preparation was good. The colonoscopy was performed                            without difficulty. The patient tolerated the                            procedure well. Scope In: 10:10:21 AM Scope Out: 10:21:43 AM Scope Withdrawal Time: 0 hours 9 minutes 4 seconds  Total Procedure Duration: 0 hours 11 minutes 22 seconds  Findings:                 The perianal and digital rectal examinations were                            normal.                           There was evidence of a prior end-to-end                            colo-colonic anastomosis in the proximal rectum.                            This was patent and was characterized by healthy                            appearing mucosa. The anastomosis was traversed.  The exam was otherwise without abnormality on                            direct views. Unable to retroflex in the rectum due                            to prior surgery. . Complications:            No immediate complications. Estimated blood loss:                            None. Estimated Blood Loss:     Estimated blood loss: none. Impression:               - Patent end-to-end colo-colonic anastomosis,                            characterized by healthy appearing mucosa.                           - The examination was otherwise normal on direct                            views.                           - No specimens collected. Recommendation:           - Repeat colonoscopy in 3 years for surveillance.                           - Patient has a contact number available for                            emergencies. The signs and symptoms of potential                            delayed complications were  discussed with the                            patient. Return to normal activities tomorrow.                            Written discharge instructions were provided to the                            patient.                           - Resume previous diet.                           - Continue present medications. Ladene Artist, MD 09/13/2017 10:27:47 AM This report has been signed electronically.

## 2017-09-14 ENCOUNTER — Telehealth: Payer: Self-pay | Admitting: *Deleted

## 2017-09-14 NOTE — Telephone Encounter (Signed)
  Follow up Call-  Call back number 09/13/2017 03/19/2016  Post procedure Call Back phone  # [phone]  Permission to leave phone message Yes Yes  Some recent data might be hidden     Patient questions:  Do you have a fever, pain , or abdominal swelling? No. Pain Score  0 *  Have you tolerated food without any problems? Yes.    Have you been able to return to your normal activities? Yes.    Do you have any questions about your discharge instructions: Diet   No. Medications  No. Follow up visit  No.  Do you have questions or concerns about your Care? No.  Actions: * If pain score is 4 or above: No action needed, pain <4.

## 2017-10-26 NOTE — Progress Notes (Signed)
[name]  [phone] [phone]) [phone]  Clinic Follow Up Note   Patient Care Team: Maury Dus, MD as PCP - General (Family Medicine) Ladene Artist, MD as Consulting Physician (Gastroenterology) Leighton Ruff, MD as Consulting Physician (General Surgery) Truitt Merle, MD as Consulting Physician (Hematology) Kyung Rudd, MD as Consulting Physician (Radiation Oncology) Tania Ade, RN as Registered Nurse Michael Boston, MD as Consulting Physician (General Surgery)   Date of Service:  10/27/2017   CHIEF COMPLAINTS:  Follow up proximal rectal cancer  Oncology History   Cancer Staging Rectal cancer ypT2ypN1cM0 s/p robotic LAR 08/04/2016 Staging form: Colon and Rectum, AJCC 7th Edition - Clinical stage from 03/19/2016: Stage IIIB (T3, N1, M0) - Signed by Truitt Merle, MD on 04/02/2016 - Pathologic stage from 08/04/2016: Stage IIIA (T2, N1b, cM0) - Signed by Truitt Merle, MD on 08/26/2016        Rectal cancer ypT2ypN1cM0 s/p robotic LAR 08/04/2016   03/19/2016 Initial Diagnosis    Rectal cancer ([place])      03/19/2016 Initial Biopsy    Rectal mass biopsy showed invasive adenocarcinoma. Sigmoid colon polyps showed tubular adenoma.       03/19/2016 Procedure    Colonoscopy showed a fungating partially obstructing mass in the proximal rectum, partially circumferential involving two thirds of the lumen circumference, measuring 6 x 4 cm, 10-16 cm from the anal verge. A 10 mm polyps was found in the sigmoid colon.      03/22/2016 Tumor Marker    CEA=1.1      03/23/2016 Imaging    CT chest, abdomen and pelvis with contrast showed asymmetric rectal mass approximately 10 cm from the anus, 2 suspicious lymph nodes in the deep pelvis concerning for local nodal metastasis. No other evidence of metastatic disease.      03/25/2016 Procedure    EUS showed a T3 proximal rectal mass, there was one small 6 mm suspicious for rectal node that was suspicious for malignancy  (uN1)      04/14/2016 - 05/24/2016 Chemotherapy    Xeloda 1800 mg every 12 hours, Monday through Friday, with concurrent radiation      04/14/2016 - 05/24/2016 Radiation Therapy     Adjuvant irradiation to his rectal cancer.      08/04/2016 Surgery    Robotic-assisted low anterior resection of rectal cancer.      08/04/2016 Pathology Results    Rectosigmoid segmental resection showed adenocarcinoma of the rectum, grade 2, tumor invades muscularis propria, margins were negative. Metastatic adenocarcinoma was seen treatment effect in 2 lymph nodes, a total of 4 nodes.        09/09/2016 - 12/23/2016 Chemotherapy    Adjuvant chemo with CAPOX. He has hypersensitive reaction to oxaliplatin (difficulty breathing), and oxaliplatin was held after cycle 2, changed to Xeloda 1035m/m2 (25064min am and 200064mn pm), 2 weeks on and one week off, for additional 3 cycles        06/20/2017 Imaging    CT CAP W Contrast 06/20/17  IMPRESSION: 1. Post therapy related findings in the pelvis including anastomotic staple line in the rectum, along with perirectal and presacral density likely the result of prior radiation therapy. 2. Oval-shaped right inferior gluteal lymph node 1.8 by 1.3 cm, formerly 1.5 by 2.3 cm. 3. Mildly prominent in size appendix, somewhat low-density, appendiceal tip diameter 1.0 cm. Difficult to exclude appendiceal mucocele, surveillance versus appendectomy suggested. 4. New lucency along the right posterior inferior margin of the L4 vertebral body, probably  a Schmorl's node given the contiguity with the disc. 5. Small to moderate-sized type 1 hiatal hernia. 6.  Aortic Atherosclerosis (ICD10-I70.0).      09/13/2017 Procedure    Colonoscopy 09/13/17 IMPRESSION:  - Patent end-to-end colo-colonic anastomosis, characterized by healthy appearing mucosa. - The examination was otherwise normal on direct views. - No specimens collected.       HISTORY OF PRESENTING ILLNESS  (04/02/2016):  Jermaine Smith 66 y.o. male is here because of his recently diagnosed rectal adenocarcinoma. He is accompanied by his wife to our multidisciplinary GI clinic today.  He has been having rectal bleeding for the past 4 months, intermittent, small amount, mixed with stool, BM is regular, but smaller caliber, no rectal pain, nausea, bloating, he has lost 5 lbs during his recent colonoscopy procedures, his appetite is normal, has been eating less, since the diagnosis of cancer. His appetite and energy level are normal. He saw gastroenterologist Dr. Fuller Plan in the past for his GERD, and self-referred back to Dr. Fuller Plan and underwent colonoscopy in early May 2017. The colonoscopy showed a polyp in sigmoid colon, and a fungating mass in the proximal rectum, biopsy showed adenocarcinoma. He underwent EUS Colonoscopy, which showed a T3 N1 disease. CT scan was negative for distant metastasis.  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 He has been very healthy, only takes PPI for acid reflux and baby aspirin. Exercise regularly. He feels well well overall.  CURRENT THERAPY: Surveillance  INTERIM HISTORY:  [name] returns for follow up. He presents to the clinic today accompanied by his wife.  He notes he has been doing well. He notes his last on 09/13/17 colonoscopy was normal overall. He was happy about this. He may get another colonoscopy in 3 years. He notes his energy is doing overall well, much improved from chemotherapy. He notes occasional pain from tingling in his feet and toes. He denies any numbness. He has returned to work full time. He notes having some short term member, and thinks this is residual effects from chemo. He reports any change in BM he had post surgery has now resolved.     MEDICAL HISTORY:  Past Medical History:  Diagnosis Date  . FHx: brain aneurysm   . GERD (gastroesophageal reflux disease)   . Hiatal hernia   . Left inguinal hernia     . Rectal cancer ([place])   . Skin cancer    hx basal cell carcinoma 08/31/17 pt denies/AW    SURGICAL HISTORY: Past Surgical History:  Procedure Laterality Date  . CLAVICLE SURGERY    . EUS N/A 03/25/2016   Procedure: LOWER ENDOSCOPIC ULTRASOUND (EUS);  Surgeon: Milus Banister, MD;  Location: [facility];  Service: Endoscopy;  Laterality: N/A;  . INGUINAL HERNIA REPAIR  1961   open right inguinal age 53 y/o  . lap bilateral ing. hernia repair Bilateral 05/09/12  . XI ROBOTIC ASSISTED LOWER ANTERIOR RESECTION N/A 08/04/2016   Procedure: XI ROBOTIC ASSISTED LOWER ANTERIOR RESECTION WITH RIGID PROCTOSCOPY;  Surgeon: Leighton Ruff, MD;  Location: [facility];  Service: General;  Laterality: N/A;    SOCIAL HISTORY: Social History   Socioeconomic History  . Marital status: Married    Spouse name: Not on file  . Number of children: 2  . Years of education: Not on file  . Highest education level: Not on file  Social Needs  . Financial resource strain: Not on file  . Food insecurity -  worry: Not on file  . Food insecurity - inability: Not on file  . Transportation needs - medical: Not on file  . Transportation needs - non-medical: Not on file  Occupational History  . Occupation: endoscope specialist    Employer: [employer]    Comment: Works on [place] Use  . Smoking status: Former Smoker    Packs/day: 0.50    Years: 5.00    Pack years: 2.50    Types: Cigarettes    Last attempt to quit: 09/03/1978    Years since quitting: 39.1  . Smokeless tobacco: Never Used  Substance and Sexual Activity  . Alcohol use: Yes    Alcohol/week: 0.0 oz    Comment: ocassional, once a month   . Drug use: No    Comment: still smoking 1ppd,had quit in 1978,   . Sexual activity: Not on file  Other Topics Concern  . 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   FAMILY HISTORY: Family History  Problem Relation Age of Onset  . Heart disease Father   . Clotting disorder Mother        PE  . Hypotension Mother   . Colon cancer Neg Hx                              He has two adopted children, 92 and 25 yo  ALLERGIES:  has No Known Allergies.  MEDICATIONS:  Current Outpatient Medications  Medication Sig Dispense Refill  . aspirin 81 MG tablet Take 81 mg by mouth every morning.     . Cholecalciferol (VITAMIN D PO) Take 1 tablet by mouth daily.    Marland Kitchen dexlansoprazole (DEXILANT) 60 MG capsule Take 60 mg by mouth every morning.     . ferrous sulfate 325 (65 FE) MG tablet Take 325 mg by mouth daily with breakfast.    . FIBER SELECT GUMMIES PO Take by mouth.    . Multiple Vitamin (MULTIVITAMIN) tablet Take 1 tablet by mouth every morning.      No current facility-administered medications for this visit.     REVIEW OF SYSTEMS:   Constitutional: Denies fevers, chills or abnormal night sweats (+) purposeful weight gain (+) improved energy Eyes: Denies blurriness of vision, double vision or watery eyes Ears, nose, mouth, throat, and face: Denies mucositis or sore throat Respiratory: Denies cough, dyspnea or wheezes Cardiovascular: Denies palpitation, chest discomfort or lower extremity swelling Gastrointestinal:  Denies nausea, heartburn or change in bowel habits Skin: Denies abnormal skin rashes Lymphatics: Denies new lymphadenopathy or easy bruising Neurological:Denies numbness, new weaknesses (+) occasional tingling toes/feet, improved (+) change in short term memory  Behavioral/Psych: Mood is stable, no new changes  All other systems were reviewed with the patient and are negative.  PHYSICAL EXAMINATION: ECOG PERFORMANCE STATUS: 1  Vitals:   10/27/17 1021  BP: (!) 158/81  Pulse: (!) 57  Resp: 20  Temp: 97.8 F (36.6 C)  SpO2: 99%   Filed Weights   10/27/17 1021  Weight:  226 lb 1.6 oz (102.6 kg)     GENERAL:alert, no distress and comfortable SKIN: skin color, texture, turgor are normal EYES: normal, conjunctiva are pink and non-injected, sclera clear OROPHARYNX:no exudate, no erythema and lips, buccal mucosa, and tongue normal  NECK: supple, thyroid normal size, non-tender, without nodularity LYMPH:  no palpable lymphadenopathy in the cervical, axillary or inguinal LUNGS: clear to auscultation and percussion with normal breathing effort HEART: regular rate & rhythm and no murmurs and no lower extremity edema ABDOMEN: abdomen soft, non-tender and normal bowel sounds, No organomegaly. Rectal exam was not performed. Musculoskeletal:no cyanosis of digits and no clubbing  PSYCH: alert & oriented x 3 with fluent speech NEURO: no focal motor/sensory deficits  LABORATORY DATA:  I have reviewed the data as listed CBC Latest Ref Rng & Units 10/27/2017 06/20/2017 01/06/2017  WBC 4.0 - 10.3 10e3/uL 4.1 4.2 3.1(L)  Hemoglobin 13.0 - 17.1 g/dL 13.2 14.6 12.4(L)  Hematocrit 38.4 - 49.9 % 37.4(L) 41.6 34.1(L)  Platelets 140 - 400 10e3/uL 152 145 106(L)    CMP Latest Ref Rng & Units 10/27/2017 06/20/2017 01/06/2017  Glucose 70 - 140 mg/dl 86 101 131  BUN 7.0 - 26.0 mg/dL 15.7 17.1 14.3  Creatinine 0.7 - 1.3 mg/dL 1.0 1.1 1.1  Sodium 136 - 145 mEq/L 140 138 139  Potassium 3.5 - 5.1 mEq/L 4.1 4.3 3.7  Chloride 101 - 111 mmol/L - - -  CO2 22 - 29 mEq/L 21(L) 24 22  Calcium 8.4 - 10.4 mg/dL 9.2 9.8 9.3  Total Protein 6.4 - 8.3 g/dL 6.9 7.1 6.6  Total Bilirubin 0.20 - 1.20 mg/dL 1.03 1.06 1.59(H)  Alkaline Phos 40 - 150 U/L 85 104 83  AST 5 - 34 U/L _0 ALT 0 - 55 U/L _1 CEA (ng/ml) 03/19/2016: 1.1 04/15/2016: 1.9 08/26/2016: 1.2  12/02/2016: 3.2 06/20/17: 1.31 10/27/17: PENDING    Diagnosis 03/19/2016 1. Colon, polyp(s), sigmoid - TUBULAR ADENOMA(S). - HIGH GRADE DYSPLASIA IS NOT IDENTIFIED. 2. Rectum, biopsy, mass - INVASIVE  ADENOCARCINOMA.  Diagnosis 08/04/2016 1. Colon, segmental resection for tumor, rectosigmoid ADENOCARCINOMA OF THE RECTUM, GRADE 2, THE TUMOR INVADES MUSCULARIS PROPRIA ALL MARGINS OF RESECTION ARE NEGATIVE FOR CARCINOMA METASTATIC ADENOCARCINOMA WITH TREATMENT EFFECT IN TWO LYMPH NODES (2/4) 2. Colon, resection margin (donut), final distal BENIGN COLONIC TISSUE Microscopic Comment 1. COLON AND RECTUM (INCLUDING TRANS-ANAL RESECTION): Specimen: Rectosigmoid colon Procedure: Segmental resection Tumor site: Rectum Specimen integrity: Intact Macroscopic intactness of mesorectum: Not applicable: NA Complete: x Near complete: NA Incomplete: NA Cannot be determined (specify): NA Macroscopic tumor perforation: Muscularis propria Invasive tumor: Maximum size: 2.1 cm Histologic type(s): Adenocarcinoma Histologic grade and differentiation: G1: well differentiated/low grade G2: moderately differentiated/low grade G3: poorly differentiated/high grade G4: undifferentiated/high grade Type of polyp in which invasive carcinoma arose: Tubular adenoma Microscopic extension of invasive tumor: Muscularis propria Lymph-Vascular invasion: Negative Peri-neural invasion: Negative Tumor deposit(s) (discontinuous extramural extension): Negative Resection margins: Proximal margin: Negative Distal margin: Negative Circumferential (radial) (posterior ascending, posterior descending; lateral and posterior mid-rectum; and entire lower 1/3 rectum):Negative Mesenteric margin (sigmoid and transverse): Negative Distance closest margin (if all above margins negative): 3.2 cm Trans-anal resection margins only: Deep margin: NA Mucosal Margin: NA Distance closest mucosal margin (if negative): NA Treatment effect (neo-adjuvant therapy): Partial Additional polyp(s): Negative Non-neoplastic findings: Unremarkable Lymph nodes: number examined 4; number positive: 2 Pathologic Staging: ypT2, ypN1, Mx Ancillary  studies: Per request   RADIOGRAPHIC STUDIES: I have personally reviewed the radiological images as listed and agreed with the findings in the report. No results found. CT chest w/ contrast 03/26/16 IMPRESSION: 1. No definite metastatic disease in the chest. The patient has scattered calcified granulomata in both lungs with two very tiny non  calcified nodules identified. These noncalcified lesions are also likely benign, but close attention on follow-up recommended.    PROCEDURES  Colonoscopy 09/13/17 IMPRESSION:  - Patent end-to-end colo-colonic anastomosis, characterized by healthy appearing mucosa. - The examination was otherwise normal on direct views. - No specimens collected.  EUS by Dr. Ardis Hughs 03/25/2016 Endosonographic Finding 1. The mass above correlated with a hyoechoic mass that clearly invades into and through the muscularis propria layer of the rectal wall (uT3). 2. The was one small (27m) suspicious perirectal lymphnode that was suspicious for malignant involvement (uN1) 3. The right obturator lymphnode that was described on recent CT scan was not visible on this exam 5cm long, non-circumferential uT3N1 (stage IIIb) proximal rectal adenocarcinoma with distal edge located 9-10cm from the anal verge. Following EUS staging, the mass was labeled with submucosal injection of SPOT.  COLONOSCOPY by Dr. SFuller Plan5/03/2016 IMPRESSION: - Malignant partially obstructing tumor in the proximal rectum. Biopsied. - One 10 mm polyp in the sigmoid colon, removed with a hot snare. Resected and retrieved. - Internal hemorrhoids.   ASSESSMENT & PLAN: 66y.o. male, presented with mild intermittent rectal bleeding for 4 months.  1. Proximal rectal adenocarcinoma, uT3N1M0, stag IIIB, ypT2N1M0,  MMR normal -I previously discussed his CT scan finding, colonoscopy and biopsy results in great details with patient and his wife. -Staging results was discussed with him also, the risk of cancer  recurrence for stage III rectal cancer is high, the standard care is neoadjuvant chemotherapy and radiation, followed by surgery and adjuvant chemotherapy. This was discussed with him in great details -He has completed neoadjuvant chemoradiation with Xeloda, tolerated well overall. -I previously reviewed his surgical pathology findings. He had a partial response to neoadjuvant chemoradiation, still has significant residual disease, especially with 2 positive lymph nodes (out of 4) -We previously discussed the role of adjuvant chemotherapy after surgery. Giving the significant residual disease, especially positive lymph nodes, I recommend him to have adjuvant chemotherapy with FOLFOX or CAPOX. He opted CAPOX, for a total of 4 cycles.  -due to his moderate side effects from Oxaliplatin,  especially hypersensitive reaction after second infusion, Oxaliplatin has been held after cycle 2, He continues Xeloda for additional 3 cycles and completed adjuvant chemo in 12/2016. -I reviewed his surveillance CT scan from 06/20/2017, which showed no evidence of recurrence. The right gluteal lymph node has decreased in size, likely benign. Other incidental findings were discussed with patient.  -He had colonoscopy on 09/13/17 which showed no polyps and overall normal exam. Will repeat in 3-5 years.  -He is clinically doing well. Labs reviewed, Exam is unremarkable. There is no clinical concern for recurrence. -He is 1.5 year from diagnosis so I will follow up with labs and exams every 4 months and repeat next surveillance CT scan in April 2019.  After 3 years will do a scan every 1-2 years. Will repeat CT AP scan before next visit to closely monitor his appendix and lymph nodes. -He asked for a flu shot, will give flu shot in clinic today.  -F/u in 4 months with lab and scan    2. Iron deficient anemia from rectal bleeding. -He had mild anemia with hemoglobin 11.9 when he was diagnosed with rectal cancer. -His iron  study showed low ferritin and transferrin saturation, consistent with iron deficiency. This is from his rectal bleeding. -He has been taking ferrous sulfate twice daily. -His anemia and iron deficiency has resolved, OK to stop iron supplement   Plan  -Flu shot  today  -F/u in 4 month with lab and CT abd/pel with contrast a few days before  -OK to stop oral iron    All questions were answered. The patient knows to call the clinic with any problems, questions or concerns.  I spent 20 minutes counseling the patient face to face. The total time spent in the appointment was 25 minutes and more than 50% was on counseling.  This document serves as a record of services personally performed by Truitt Merle, MD. It was created on her behalf by Joslyn Devon, a trained medical scribe. The creation of this record is based on the scribe's personal observations and the provider's statements to them.    I have reviewed the above documentation for accuracy and completeness, and I agree with the above.   Truitt Merle, MD 10/27/2017

## 2017-10-27 ENCOUNTER — Other Ambulatory Visit (HOSPITAL_BASED_OUTPATIENT_CLINIC_OR_DEPARTMENT_OTHER): Payer: Medicare Other

## 2017-10-27 ENCOUNTER — Telehealth: Payer: Self-pay | Admitting: Hematology

## 2017-10-27 ENCOUNTER — Encounter: Payer: Self-pay | Admitting: Hematology

## 2017-10-27 ENCOUNTER — Ambulatory Visit (HOSPITAL_BASED_OUTPATIENT_CLINIC_OR_DEPARTMENT_OTHER): Payer: Medicare Other | Admitting: Hematology

## 2017-10-27 VITALS — BP 158/81 | HR 57 | Temp 97.8°F | Resp 20 | Ht 76.0 in | Wt 226.1 lb

## 2017-10-27 DIAGNOSIS — C20 Malignant neoplasm of rectum: Secondary | ICD-10-CM

## 2017-10-27 DIAGNOSIS — Z23 Encounter for immunization: Secondary | ICD-10-CM

## 2017-10-27 DIAGNOSIS — D509 Iron deficiency anemia, unspecified: Secondary | ICD-10-CM

## 2017-10-27 LAB — CBC WITH DIFFERENTIAL/PLATELET
BASO%: 1 % (ref 0.0–2.0)
BASOS ABS: 0 10*3/uL (ref 0.0–0.1)
EOS ABS: 0.1 10*3/uL (ref 0.0–0.5)
EOS%: 2.3 % (ref 0.0–7.0)
HCT: 37.4 % — ABNORMAL LOW (ref 38.4–49.9)
HGB: 13.2 g/dL (ref 13.0–17.1)
LYMPH%: 17.3 % (ref 14.0–49.0)
MCH: 30.4 pg (ref 27.2–33.4)
MCHC: 35.2 g/dL (ref 32.0–36.0)
MCV: 86.4 fL (ref 79.3–98.0)
MONO#: 0.5 10*3/uL (ref 0.1–0.9)
MONO%: 12.1 % (ref 0.0–14.0)
NEUT#: 2.8 10*3/uL (ref 1.5–6.5)
NEUT%: 67.3 % (ref 39.0–75.0)
PLATELETS: 152 10*3/uL (ref 140–400)
RBC: 4.33 10*6/uL (ref 4.20–5.82)
RDW: 13.6 % (ref 11.0–14.6)
WBC: 4.1 10*3/uL (ref 4.0–10.3)
lymph#: 0.7 10*3/uL — ABNORMAL LOW (ref 0.9–3.3)

## 2017-10-27 LAB — COMPREHENSIVE METABOLIC PANEL
ALBUMIN: 4 g/dL (ref 3.5–5.0)
ALK PHOS: 85 U/L (ref 40–150)
ALT: 21 U/L (ref 0–55)
AST: 25 U/L (ref 5–34)
Anion Gap: 9 mEq/L (ref 3–11)
BILIRUBIN TOTAL: 1.03 mg/dL (ref 0.20–1.20)
BUN: 15.7 mg/dL (ref 7.0–26.0)
CO2: 21 meq/L — AB (ref 22–29)
CREATININE: 1 mg/dL (ref 0.7–1.3)
Calcium: 9.2 mg/dL (ref 8.4–10.4)
Chloride: 110 mEq/L — ABNORMAL HIGH (ref 98–109)
GLUCOSE: 86 mg/dL (ref 70–140)
Potassium: 4.1 mEq/L (ref 3.5–5.1)
SODIUM: 140 meq/L (ref 136–145)
TOTAL PROTEIN: 6.9 g/dL (ref 6.4–8.3)

## 2017-10-27 LAB — FERRITIN: Ferritin: 75 ng/ml (ref 22–316)

## 2017-10-27 LAB — CEA (IN HOUSE-CHCC): CEA (CHCC-In House): 1.7 ng/mL (ref 0.00–5.00)

## 2017-10-27 LAB — IRON AND TIBC
%SAT: 26 % (ref 20–55)
Iron: 72 ug/dL (ref 42–163)
TIBC: 283 ug/dL (ref 202–409)
UIBC: 210 ug/dL (ref 117–376)

## 2017-10-27 MED ORDER — INFLUENZA VAC SPLIT HIGH-DOSE 0.5 ML IM SUSY
0.5000 mL | PREFILLED_SYRINGE | INTRAMUSCULAR | Status: AC
  Administered 2017-10-27: 0.5 mL via INTRAMUSCULAR
  Filled 2017-10-27: qty 0.5

## 2017-10-27 MED ORDER — INFLUENZA VAC SPLIT QUAD 0.5 ML IM SUSY: 0.5000 mL | PREFILLED_SYRINGE | Freq: Once | INTRAMUSCULAR | Status: DC

## 2017-10-27 NOTE — Telephone Encounter (Signed)
Gave avs and calendar for April 2019 °

## 2017-10-31 ENCOUNTER — Telehealth: Payer: Self-pay | Admitting: *Deleted

## 2017-10-31 NOTE — Telephone Encounter (Signed)
-----   Message from Truitt Merle, MD sent at 10/30/2017 11:16 AM EST ----- Please let pt know his iron level and CEA were normal from 3 days ago. OK to stop oral iron is he is still on.  Thanks  Truitt Merle  10/30/2017

## 2017-10-31 NOTE — Telephone Encounter (Signed)
Spoke with pt and informed pt of lab results from 3 days ago were normal as per Dr. Ernestina Penna instructions.  Instructed pt re:  OK to stop oral iron as per md.  Pt voiced understanding.

## 2017-11-04 IMAGING — CT CT ABD-PELV W/ CM
1 of 2 series · 15 of 32 positions shown, 19 images · IV contrast (ISOVUE 300)
Comparison: None.

CLINICAL DATA: Rectal cancer diagnosis on recent colonoscopy.

EXAM:
CT ABDOMEN AND PELVIS WITH CONTRAST
TECHNIQUE: Multidetector CT imaging of the abdomen and pelvis was performed
using the standard protocol following bolus administration of
intravenous contrast.
CONTRAST:  100mL AVM6BQ-9MM IOPAMIDOL (AVM6BQ-9MM) INJECTION 61%

[Series 2: abd/ pelvis · axial · 0.78mm/px · z∈[-493,-58]mm · 15 of 97 slices shown, 19 images]
[im 5/97  soft-tissue]
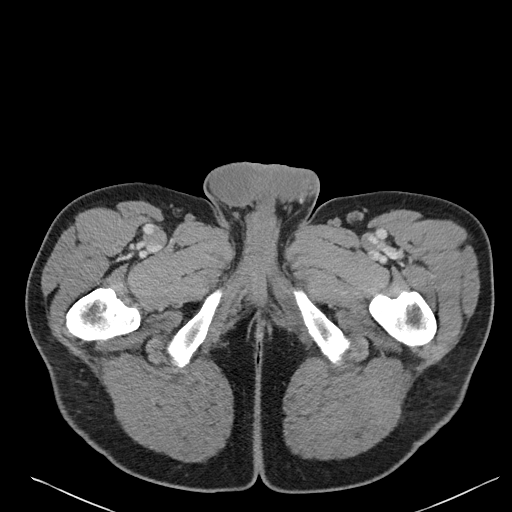
[im 5/97  bone]
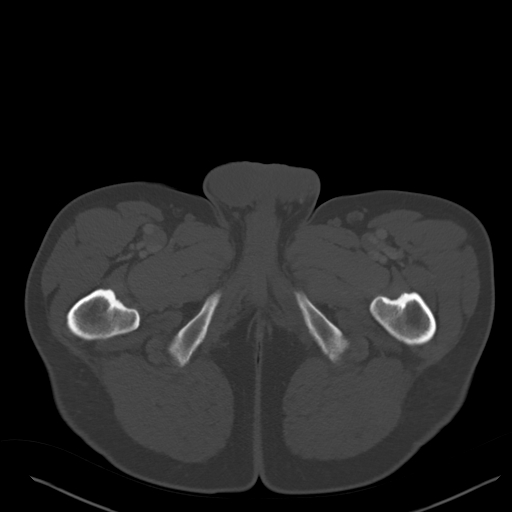
[im 13/97  soft-tissue]
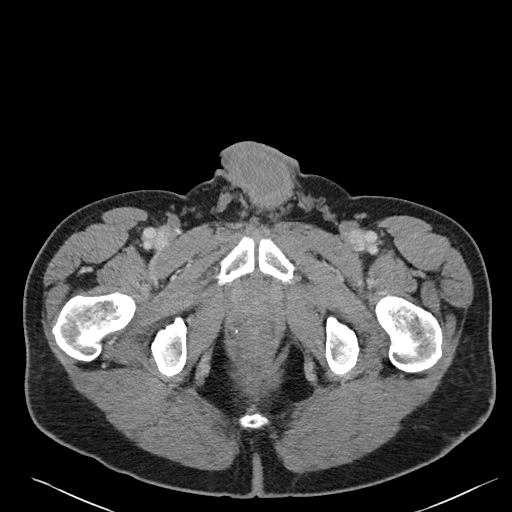
[im 21/97  soft-tissue]
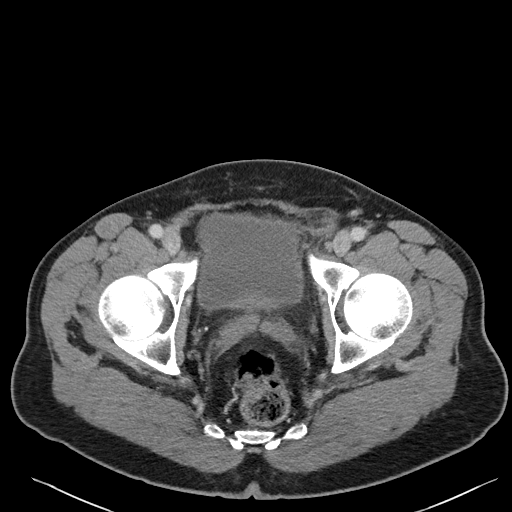
[im 26/97  soft-tissue]
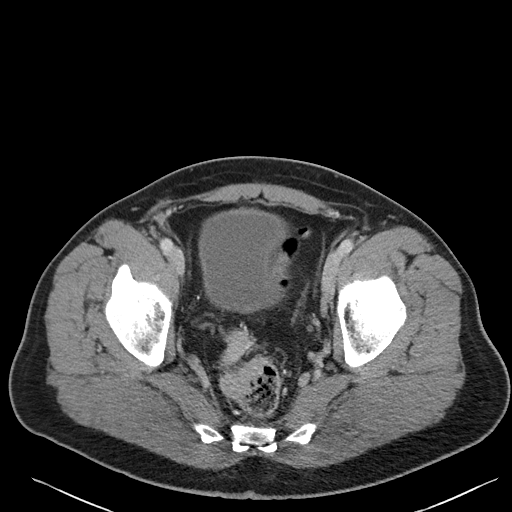
[im 34/97  soft-tissue]
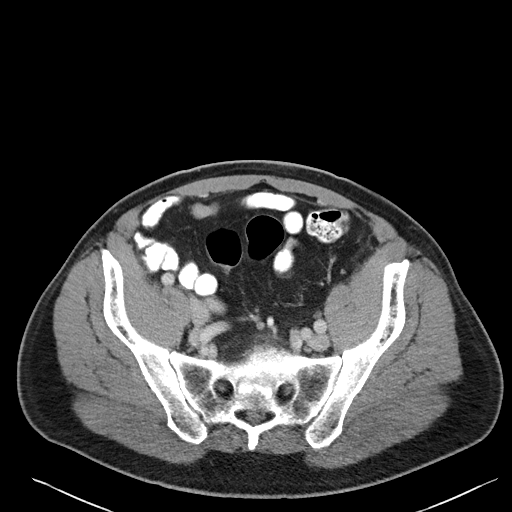
[im 42/97  soft-tissue]
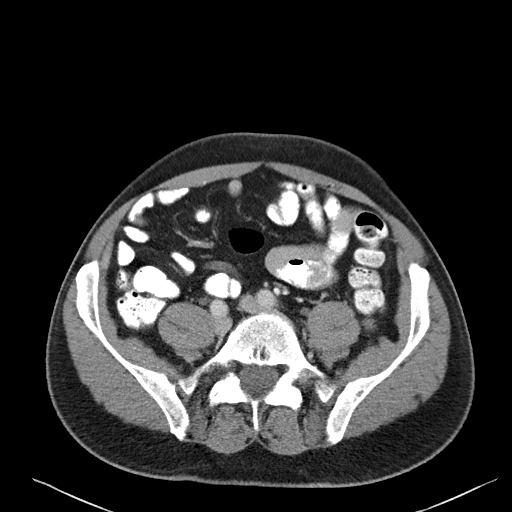
[im 51/97  soft-tissue]
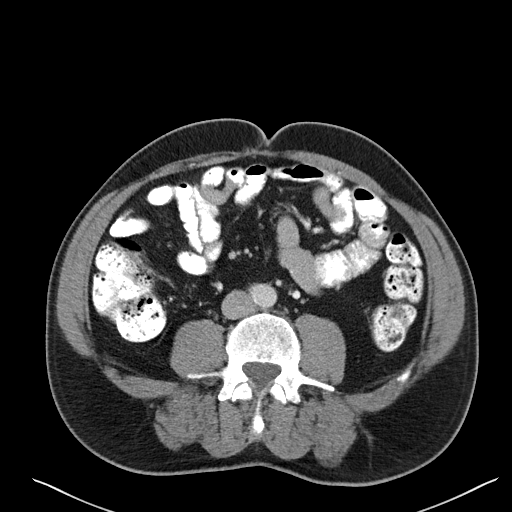
[im 55/97  soft-tissue]
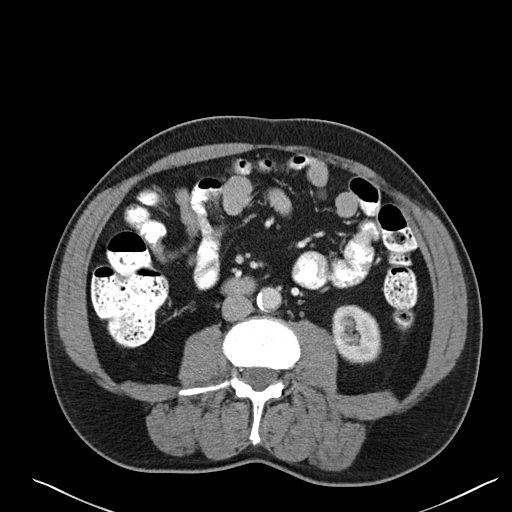
[im 63/97  soft-tissue]
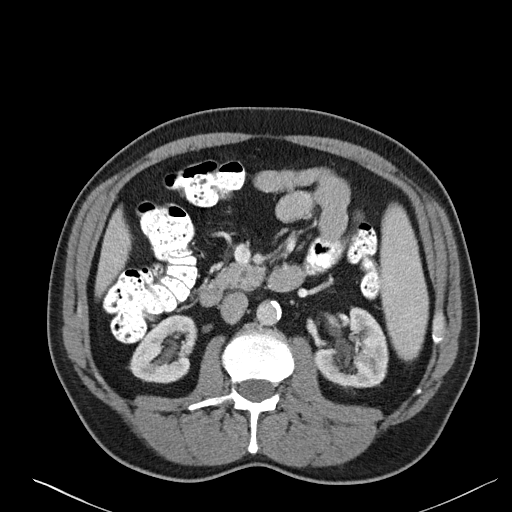
[im 63/97  bone]
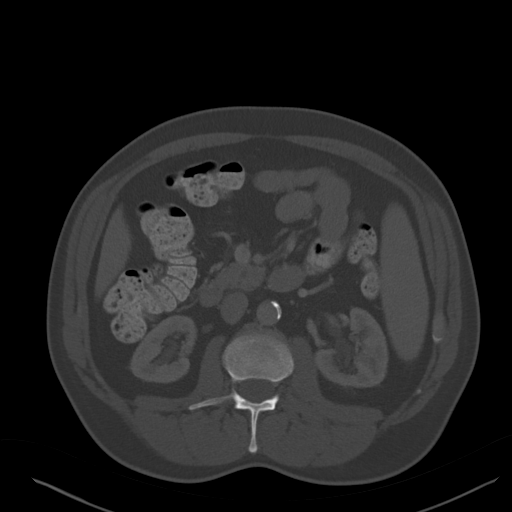
[im 71/97  soft-tissue]
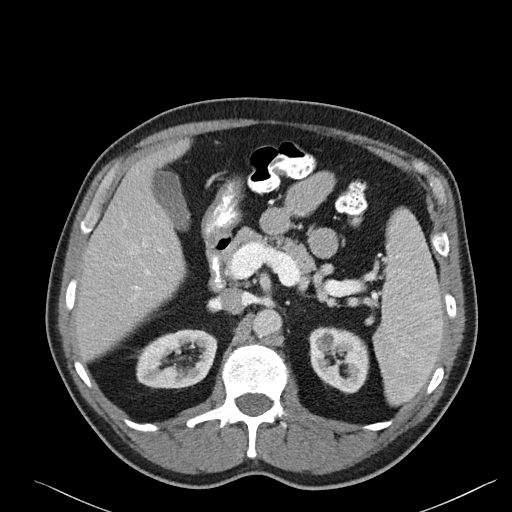
[im 76/97  soft-tissue]
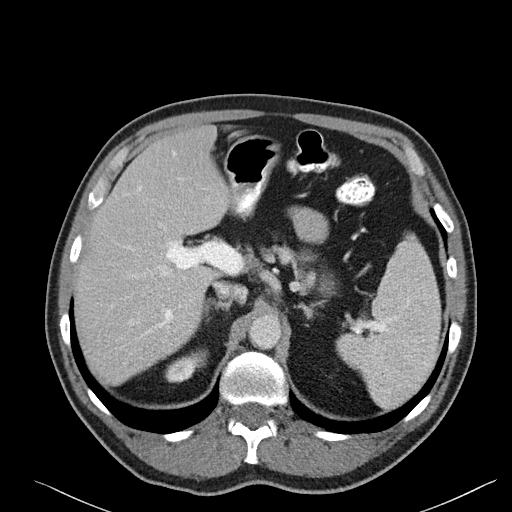
[im 80/97  lung]
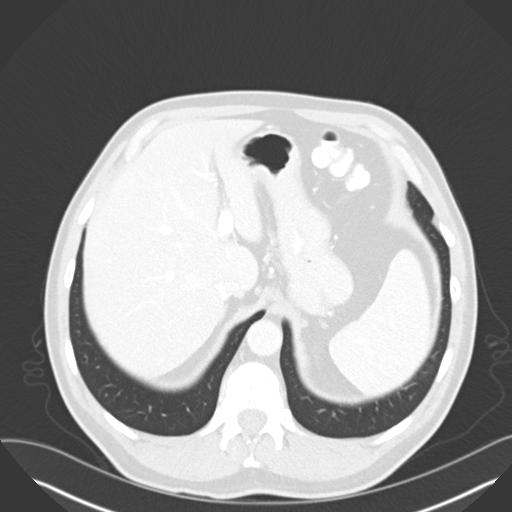
[im 84/97  soft-tissue]
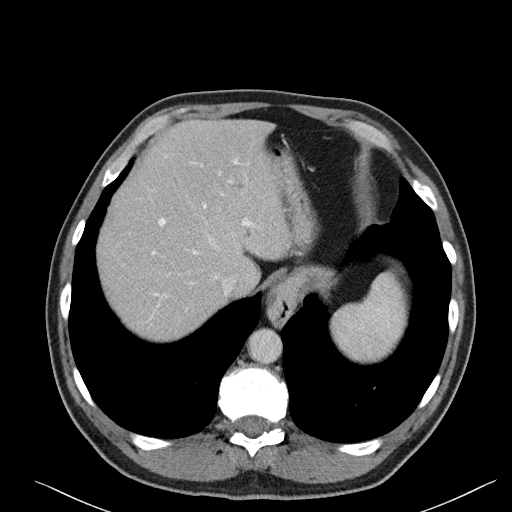
[im 84/97  lung]
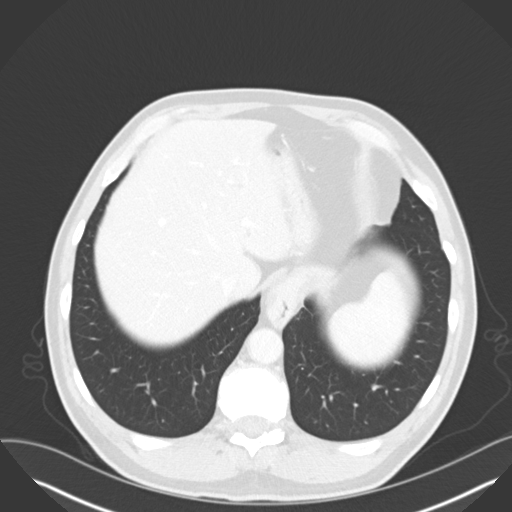
[im 88/97  lung]
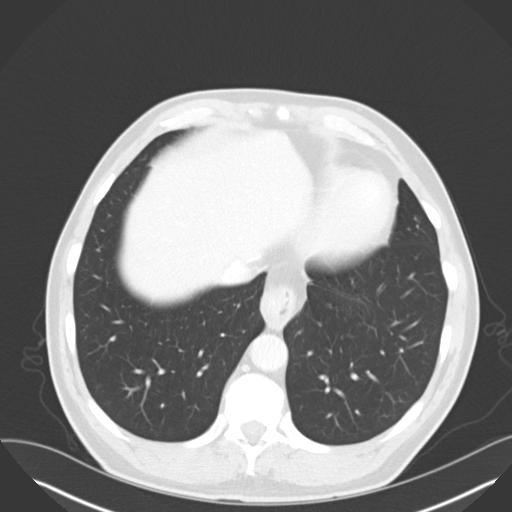
[im 92/97  soft-tissue]
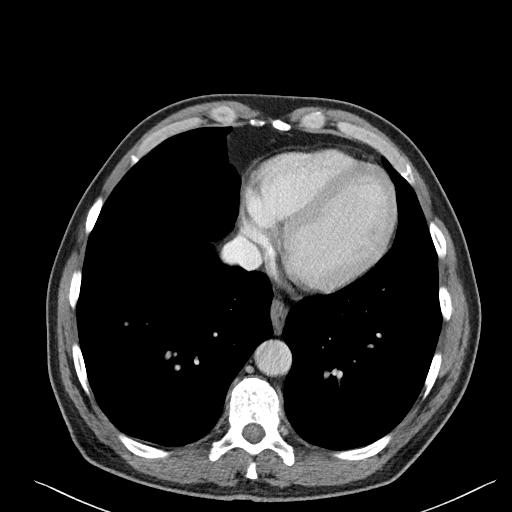
[im 92/97  lung]
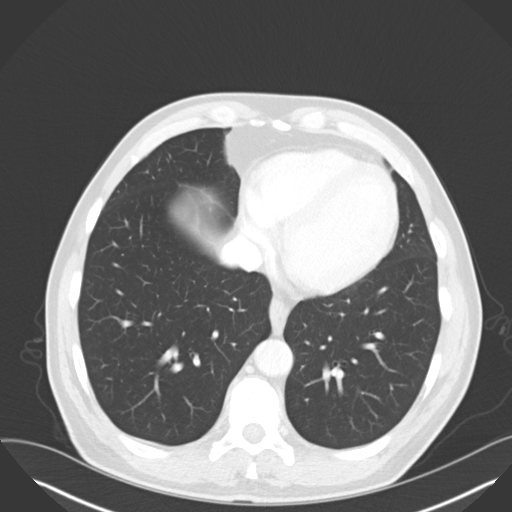

[15 of 32 positions shown; findings below may reference images not displayed]

FINDINGS: Lower chest: Lung bases are clear.

Hepatobiliary: No focal hepatic lesion. No biliary duct dilatation.
Gallbladder is normal. Common bile duct is normal.

Pancreas: Pancreas is normal. No ductal dilatation. No pancreatic
inflammation.

Spleen: Normal spleen

Adrenals/urinary tract: Adrenal glands and kidneys are normal. The
ureters and bladder normal.

Stomach/Bowel: Small hiatal hernia. The duodenum and small bowel are
normal. Appendix and cecum normal. The ascending and transverse
colon are normal. Descending colon normal. Sigmoid colon normal.

In the mid rectum there is asymmetric thickening along the RIGHT
aspect of the rectum measuring 2 cm in thickness (image 70, series
5). This lesion is approximately 10 cm from the anus at the S2-S3
vertebral body level. Lesion is approximately 5 cm in length (image
51, series 6).

Ovoid 15 mm lesion in the RIGHT obturator space (image 75, series 2)
is concerning for an abnormal lymph node. Small perirectal lymph
node noted on the LEFT measuring 7 mm (image 69, series 2).

Vascular/Lymphatic: Mild intimal calcification aorta. No periaortic
retroperitoneal adenopathy or mesenteric adenopathy. Deep pelvis
lymph nodes described in the bowel section.

Reproductive: Prostate normal.

Other: No peritoneal disease.

Musculoskeletal: No aggressive osseous lesion. Densely sclerotic
lesion in L3 is likely benign bone island.
IMPRESSION: 1. Asymmetric rectal mass approximately 10 cm from the anus.
2. Two suspicious lymph nodes in the deep pelvis concerning for
local nodal metastasis.
3. No evidence of metastatic disease outside of the pelvis.

## 2018-02-21 ENCOUNTER — Ambulatory Visit (HOSPITAL_COMMUNITY)
Admission: RE | Admit: 2018-02-21 | Discharge: 2018-02-21 | Disposition: A | Discharge status: Home / Self Care | Payer: Medicare Other | Source: Ambulatory Visit | Attending: Hematology | Admitting: Hematology

## 2018-02-21 ENCOUNTER — Inpatient Hospital Stay: Payer: Medicare Other | Attending: Hematology

## 2018-02-21 ENCOUNTER — Encounter (HOSPITAL_COMMUNITY): Payer: Self-pay

## 2018-02-21 DIAGNOSIS — Z79899 Other long term (current) drug therapy: Secondary | ICD-10-CM | POA: Insufficient documentation

## 2018-02-21 DIAGNOSIS — G620 Drug-induced polyneuropathy: Secondary | ICD-10-CM | POA: Diagnosis not present

## 2018-02-21 DIAGNOSIS — K449 Diaphragmatic hernia without obstruction or gangrene: Secondary | ICD-10-CM | POA: Diagnosis not present

## 2018-02-21 DIAGNOSIS — R6882 Decreased libido: Secondary | ICD-10-CM | POA: Diagnosis not present

## 2018-02-21 DIAGNOSIS — Z9221 Personal history of antineoplastic chemotherapy: Secondary | ICD-10-CM | POA: Diagnosis not present

## 2018-02-21 DIAGNOSIS — K829 Disease of gallbladder, unspecified: Secondary | ICD-10-CM | POA: Insufficient documentation

## 2018-02-21 DIAGNOSIS — I700 Atherosclerosis of aorta: Secondary | ICD-10-CM | POA: Insufficient documentation

## 2018-02-21 DIAGNOSIS — Z7982 Long term (current) use of aspirin: Secondary | ICD-10-CM | POA: Insufficient documentation

## 2018-02-21 DIAGNOSIS — D509 Iron deficiency anemia, unspecified: Secondary | ICD-10-CM

## 2018-02-21 DIAGNOSIS — Z87891 Personal history of nicotine dependence: Secondary | ICD-10-CM | POA: Diagnosis not present

## 2018-02-21 DIAGNOSIS — C19 Malignant neoplasm of rectosigmoid junction: Secondary | ICD-10-CM | POA: Diagnosis not present

## 2018-02-21 DIAGNOSIS — N281 Cyst of kidney, acquired: Secondary | ICD-10-CM | POA: Diagnosis not present

## 2018-02-21 DIAGNOSIS — C20 Malignant neoplasm of rectum: Secondary | ICD-10-CM | POA: Insufficient documentation

## 2018-02-21 DIAGNOSIS — R530 Neoplastic (malignant) related fatigue: Secondary | ICD-10-CM | POA: Diagnosis not present

## 2018-02-21 DIAGNOSIS — Z923 Personal history of irradiation: Secondary | ICD-10-CM | POA: Diagnosis not present

## 2018-02-21 LAB — IRON AND TIBC
Iron: 101 ug/dL (ref 42–163)
SATURATION RATIOS: 35 % — AB (ref 42–163)
TIBC: 289 ug/dL (ref 202–409)
UIBC: 187 ug/dL

## 2018-02-21 LAB — COMPREHENSIVE METABOLIC PANEL
ALK PHOS: 94 U/L (ref 40–150)
ALT: 29 U/L (ref 0–55)
ANION GAP: 8 (ref 3–11)
AST: 25 U/L (ref 5–34)
Albumin: 4.1 g/dL (ref 3.5–5.0)
BUN: 17 mg/dL (ref 7–26)
CALCIUM: 9.6 mg/dL (ref 8.4–10.4)
CO2: 23 mmol/L (ref 22–29)
Chloride: 106 mmol/L (ref 98–109)
Creatinine, Ser: 1.07 mg/dL (ref 0.70–1.30)
GFR calc non Af Amer: >60 mL/min (ref >60)
Glucose, Bld: 95 mg/dL (ref 70–140)
POTASSIUM: 4.3 mmol/L (ref 3.5–5.1)
SODIUM: 137 mmol/L (ref 136–145)
TOTAL PROTEIN: 7.2 g/dL (ref 6.4–8.3)
Total Bilirubin: 1.1 mg/dL (ref 0.2–1.2)

## 2018-02-21 LAB — CBC WITH DIFFERENTIAL/PLATELET
BASOS ABS: 0 10*3/uL (ref 0.0–0.1)
BASOS PCT: 0 %
Eosinophils Absolute: 0.1 10*3/uL (ref 0.0–0.5)
Eosinophils Relative: 1 %
HCT: 39.8 % (ref 38.4–49.9)
HEMOGLOBIN: 13.9 g/dL (ref 13.0–17.1)
Lymphocytes Relative: 18 %
Lymphs Abs: 0.8 10*3/uL — ABNORMAL LOW (ref 0.9–3.3)
MCH: 30 pg (ref 27.2–33.4)
MCHC: 34.9 g/dL (ref 32.0–36.0)
MCV: 85.8 fL (ref 79.3–98.0)
Monocytes Absolute: 0.5 10*3/uL (ref 0.1–0.9)
Monocytes Relative: 11 %
NEUTROS PCT: 70 %
Neutro Abs: 3 10*3/uL (ref 1.5–6.5)
Platelets: 154 10*3/uL (ref 140–400)
RBC: 4.64 MIL/uL (ref 4.20–5.82)
RDW: 13.2 % (ref 11.0–14.6)
WBC: 4.4 10*3/uL (ref 4.0–10.3)

## 2018-02-21 LAB — CEA (IN HOUSE-CHCC)

## 2018-02-21 LAB — FERRITIN: Ferritin: 68 ng/mL (ref 22–316)

## 2018-02-21 MED ORDER — IOHEXOL 300 MG/ML  SOLN
100.0000 mL | Freq: Once | INTRAMUSCULAR | Status: AC | PRN
  Administered 2018-02-21: 100 mL via INTRAVENOUS

## 2018-02-22 NOTE — Progress Notes (Signed)
[place]  Telephone:(336) [phone] Fax:(336) [phone]  Clinic Follow Up Note   Patient Care Team: Maury Dus, MD as PCP - General (Family Medicine) Ladene Artist, MD as Consulting Physician (Gastroenterology) Leighton Ruff, MD as Consulting Physician (General Surgery) Truitt Merle, MD as Consulting Physician (Hematology) Kyung Rudd, MD as Consulting Physician (Radiation Oncology) Tania Ade, RN as Registered Nurse Michael Boston, MD as Consulting Physician (General Surgery)   Date of Service:  02/23/2018   CHIEF COMPLAINTS:  Follow up proximal rectal cancer  Oncology History   Cancer Staging Rectal cancer ypT2ypN1cM0 s/p robotic LAR 08/04/2016 Staging form: Colon and Rectum, AJCC 7th Edition - Clinical stage from 03/19/2016: Stage IIIB (T3, N1, M0) - Signed by Truitt Merle, MD on 04/02/2016 - Pathologic stage from 08/04/2016: Stage IIIA (T2, N1b, cM0) - Signed by Truitt Merle, MD on 08/26/2016        Rectal cancer ypT2ypN1cM0 s/p robotic LAR 08/04/2016   03/19/2016 Initial Diagnosis    Rectal cancer ([place])      03/19/2016 Initial Biopsy    Rectal mass biopsy showed invasive adenocarcinoma. Sigmoid colon polyps showed tubular adenoma.       03/19/2016 Procedure    Colonoscopy showed a fungating partially obstructing mass in the proximal rectum, partially circumferential involving two thirds of the lumen circumference, measuring 6 x 4 cm, 10-16 cm from the anal verge. A 10 mm polyps was found in the sigmoid colon.      03/22/2016 Tumor Marker    CEA=1.1      03/23/2016 Imaging    CT chest, abdomen and pelvis with contrast showed asymmetric rectal mass approximately 10 cm from the anus, 2 suspicious lymph nodes in the deep pelvis concerning for local nodal metastasis. No other evidence of metastatic disease.      03/25/2016 Procedure    EUS showed a T3 proximal rectal mass, there was one small 6 mm suspicious for rectal node that was suspicious for malignancy  (uN1)      04/14/2016 - 05/24/2016 Chemotherapy    Xeloda 1800 mg every 12 hours, Monday through Friday, with concurrent radiation      04/14/2016 - 05/24/2016 Radiation Therapy     Adjuvant irradiation to his rectal cancer.      08/04/2016 Surgery    Robotic-assisted low anterior resection of rectal cancer.      08/04/2016 Pathology Results    Rectosigmoid segmental resection showed adenocarcinoma of the rectum, grade 2, tumor invades muscularis propria, margins were negative. Metastatic adenocarcinoma was seen treatment effect in 2 lymph nodes, a total of 4 nodes.        09/09/2016 - 12/23/2016 Chemotherapy    Adjuvant chemo with CAPOX. He has hypersensitive reaction to oxaliplatin (difficulty breathing), and oxaliplatin was held after cycle 2, changed to Xeloda 1076m/m2 (2506min am and 200053mn pm), 2 weeks on and one week off, for additional 3 cycles        06/20/2017 Imaging    CT CAP W Contrast 06/20/17  IMPRESSION: 1. Post therapy related findings in the pelvis including anastomotic staple line in the rectum, along with perirectal and presacral density likely the result of prior radiation therapy. 2. Oval-shaped right inferior gluteal lymph node 1.8 by 1.3 cm, formerly 1.5 by 2.3 cm. 3. Mildly prominent in size appendix, somewhat low-density, appendiceal tip diameter 1.0 cm. Difficult to exclude appendiceal mucocele, surveillance versus appendectomy suggested. 4. New lucency along the right posterior inferior margin of the L4 vertebral body, probably  a Schmorl's node given the contiguity with the disc. 5. Small to moderate-sized type 1 hiatal hernia. 6.  Aortic Atherosclerosis (ICD10-I70.0).      09/13/2017 Procedure    Colonoscopy 09/13/17 IMPRESSION:  - Patent end-to-end colo-colonic anastomosis, characterized by healthy appearing mucosa. - The examination was otherwise normal on direct views. - No specimens collected.      02/21/2018 Imaging    CT AP W Contrast  02/21/18 IMPRESSION: 1. Postoperative findings in the lower rectum, with post therapy related findings in the region including stranding along the perirectal space margins and presacral stranding. The only potential finding of concern is a 1.3 cm in short axis right obturator lymph node, previously measured at 1.5 cm. No focal liver lesion. 2. Abnormal low-density and distention of the appendix with a 9 mm diameter. No periappendiceal stranding. Appearance concerning for appendiceal mucocele. Surgical consultation recommended. 3. Other imaging findings of potential clinical significance: Small type 1 hiatal hernia. Aortic Atherosclerosis (ICD10-I70.0). Dependent sludge or gallstones in the gallbladder.       HISTORY OF PRESENTING ILLNESS (04/02/2016):  Jermaine Smith 67 y.o. male is here because of his recently diagnosed rectal adenocarcinoma. He is accompanied by his wife to our multidisciplinary GI clinic today.  He has been having rectal bleeding for the past 4 months, intermittent, small amount, mixed with stool, BM is regular, but smaller caliber, no rectal pain, nausea, bloating, he has lost 5 lbs during his recent colonoscopy procedures, his appetite is normal, has been eating less, since the diagnosis of cancer. His appetite and energy level are normal. He saw gastroenterologist Dr. Fuller Plan in the past for his GERD, and self-referred back to Dr. Fuller Plan and underwent colonoscopy in early May 2017. The colonoscopy showed a polyp in sigmoid colon, and a fungating mass in the proximal rectum, biopsy showed adenocarcinoma. He underwent EUS Colonoscopy, which showed a T3 N1 disease. CT scan was negative for distant metastasis.  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 He has been very healthy, only takes PPI for acid reflux and baby aspirin. Exercise regularly. He feels well overall.  CURRENT THERAPY: Surveillance  INTERIM HISTORY:  [name] returns for follow  up and to discuss his CT AP from this week. He presents to the clinic today accompanied by his wife.  He notes he is still busy but his energy level is adequate for him to keep up with his work load. He feels he has recovered from chemotherapy. He wants to know want damage would occurs to lower extremity vessels He think he could be sterilized from radiation. His libido has been effected and is looking for help with this. He is will to see his PCP or Urologist about this. He notes he had to breathe deep and fast with his last CT scan only when contrast was being given. He denies allergic reaction from this.    On review of symptoms, pt notes lingering fatigue so now he naps after lunch. His neuropathy is improving, now gone in his fingers, still lingering in his toes. He denies issues with incontinence.    MEDICAL HISTORY:  Past Medical History:  Diagnosis Date  . FHx: brain aneurysm   . GERD (gastroesophageal reflux disease)   . Hiatal hernia   . Left inguinal hernia   . Rectal cancer ([place])   . Skin cancer    hx basal cell carcinoma 08/31/17 pt denies/AW    SURGICAL HISTORY: Past Surgical History:  Procedure Laterality Date  . CLAVICLE SURGERY    . EUS N/A 03/25/2016   Procedure: LOWER ENDOSCOPIC ULTRASOUND (EUS);  Surgeon: Milus Banister, MD;  Location: [facility];  Service: Endoscopy;  Laterality: N/A;  . INGUINAL HERNIA REPAIR  1961   open right inguinal age 44 y/o  . lap bilateral ing. hernia repair Bilateral 05/09/12  . XI ROBOTIC ASSISTED LOWER ANTERIOR RESECTION N/A 08/04/2016   Procedure: XI ROBOTIC ASSISTED LOWER ANTERIOR RESECTION WITH RIGID PROCTOSCOPY;  Surgeon: Leighton Ruff, MD;  Location: [facility];  Service: General;  Laterality: N/A;    SOCIAL HISTORY: Social History   Socioeconomic History  . Marital status: Married    Spouse name: Not on file  . Number of children: 2  . Years of education: Not on file  . Highest education level: Not on file  Occupational  History  . Occupation: endoscope specialist    Employer: [employer]    Comment: Works on [place]  . Financial resource strain: Not on file  . Food insecurity:    Worry: Not on file    Inability: Not on file  . Transportation needs:    Medical: Not on file    Non-medical: Not on file  Tobacco Use  . Smoking status: Former Smoker    Packs/day: 0.50    Years: 5.00    Pack years: 2.50    Types: Cigarettes    Last attempt to quit: 09/03/1978    Years since quitting: 39.5  . Smokeless tobacco: Never Used  Substance and Sexual Activity  . Alcohol use: Yes    Alcohol/week: 0.0 oz    Comment: ocassional, once a month   . Drug use: No    Comment: still smoking 1ppd,had quit in 1978,   . Sexual activity: Not on file  Lifestyle  . Physical activity:    Days per week: Not on file    Minutes per session: Not on file  . Stress: Not on file  Relationships  . Social connections:    Talks on phone: Not on file    Gets together: Not on file    Attends religious service: Not on file    Active member of club or organization: Not on file    Attends meetings of clubs or organizations: Not on file    Relationship status: Not on file  . Intimate partner violence:    Fear of current or ex partner: Not on file    Emotionally abused: Not on file    Physically abused: Not on file    Forced sexual activity: Not on file  Other Topics Concern  . 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   FAMILY HISTORY: Family History  Problem Relation Age of Onset  . Heart disease Father   . Clotting disorder Mother        PE  . Hypotension Mother   . Colon cancer Neg Hx                              He has two adopted children, 50 and 4 yo  ALLERGIES:  has No Known  Allergies.  MEDICATIONS:  Current Outpatient Medications  Medication Sig Dispense Refill  . aspirin 81 MG tablet Take 81  mg by mouth every morning.     . Cholecalciferol (VITAMIN D PO) Take 1 tablet by mouth daily.    Marland Kitchen dexlansoprazole (DEXILANT) 60 MG capsule Take 60 mg by mouth every morning.     Marland Kitchen FIBER SELECT GUMMIES PO Take by mouth.    . Multiple Vitamin (MULTIVITAMIN) tablet Take 1 tablet by mouth every morning.      No current facility-administered medications for this visit.     REVIEW OF SYSTEMS:   Constitutional: Denies fevers, chills or abnormal night sweats Eyes: Denies blurriness of vision, double vision or watery eyes Ears, nose, mouth, throat, and face: Denies mucositis or sore throat Respiratory: Denies cough, dyspnea or wheezes Cardiovascular: Denies palpitation, chest discomfort or lower extremity swelling Gastrointestinal:  Denies nausea, heartburn or change in bowel habits Skin: Denies abnormal skin rashes Lymphatics: Denies new lymphadenopathy or easy bruising Neurological:Denies numbness, new weaknesses (+) occasional tingling toes improved (+) change in short term memory  Behavioral/Psych: Mood is stable, no new changes  All other systems were reviewed with the patient and are negative.  PHYSICAL EXAMINATION: ECOG PERFORMANCE STATUS: 0  Vitals:   02/23/18 1145  BP: (!) 148/82  Pulse: (!) 54  Resp: 18  Temp: 98.1 F (36.7 C)  SpO2: 99%   Filed Weights   02/23/18 1145  Weight: 224 lb 3.2 oz (101.7 kg)     GENERAL:alert, no distress and comfortable SKIN: skin color, texture, turgor are normal EYES: normal, conjunctiva are pink and non-injected, sclera clear OROPHARYNX:no exudate, no erythema and lips, buccal mucosa, and tongue normal  NECK: supple, thyroid normal size, non-tender, without nodularity LYMPH:  no palpable lymphadenopathy in the cervical, axillary or inguinal LUNGS: clear to auscultation and percussion with normal breathing  effort HEART: regular rate & rhythm and no murmurs and no lower extremity edema ABDOMEN: abdomen soft, non-tender and normal bowel sounds, No organomegaly. Rectal exam was not performed. Musculoskeletal:no cyanosis of digits and no clubbing  PSYCH: alert & oriented x 3 with fluent speech NEURO: no focal motor/sensory deficits  LABORATORY DATA:  I have reviewed the data as listed CBC Latest Ref Rng & Units 02/21/2018 10/27/2017 06/20/2017  WBC 4.0 - 10.3 K/uL 4.4 4.1 4.2  Hemoglobin 13.0 - 17.1 g/dL 13.9 13.2 14.6  Hematocrit 38.4 - 49.9 % 39.8 37.4(L) 41.6  Platelets 140 - 400 K/uL 154 152 145    CMP Latest Ref Rng & Units 02/21/2018 10/27/2017 06/20/2017  Glucose 70 - 140 mg/dL 95 86 101  BUN 7 - 26 mg/dL 17 15.7 17.1  Creatinine 0.70 - 1.30 mg/dL 1.07 1.0 1.1  Sodium 136 - 145 mmol/L 137 140 138  Potassium 3.5 - 5.1 mmol/L 4.3 4.1 4.3  Chloride 98 - 109 mmol/L 106 - -  CO2 22 - 29 mmol/L 23 21(L) 24  Calcium 8.4 - 10.4 mg/dL 9.6 9.2 9.8  Total Protein 6.4 - 8.3 g/dL 7.2 6.9 7.1  Total Bilirubin 0.2 - 1.2 mg/dL 1.1 1.03 1.06  Alkaline Phos 40 - 150 U/L 94 85 104  AST 5 - 34 U/L 25 25 27   ALT 0 - 55 U/L 29 21 25     CEA (ng/ml) 03/19/2016: 1.1 04/15/2016: 1.9 08/26/2016: 1.2  12/02/2016: 3.2 06/20/17: 1.31 10/27/17: 1.70 02/21/18: < 1.00   Diagnosis 03/19/2016 1. Colon, polyp(s), sigmoid - TUBULAR ADENOMA(S). - HIGH GRADE DYSPLASIA IS NOT IDENTIFIED. 2. Rectum, biopsy, mass - INVASIVE ADENOCARCINOMA.  Diagnosis 08/04/2016 1. Colon, segmental resection for tumor, rectosigmoid ADENOCARCINOMA OF THE RECTUM, GRADE  2, THE TUMOR INVADES MUSCULARIS PROPRIA ALL MARGINS OF RESECTION ARE NEGATIVE FOR CARCINOMA METASTATIC ADENOCARCINOMA WITH TREATMENT EFFECT IN TWO LYMPH NODES (2/4) 2. Colon, resection margin (donut), final distal BENIGN COLONIC TISSUE Microscopic Comment 1. COLON AND RECTUM (INCLUDING TRANS-ANAL RESECTION): Specimen: Rectosigmoid colon Procedure: Segmental  resection Tumor site: Rectum Specimen integrity: Intact Macroscopic intactness of mesorectum: Not applicable: NA Complete: x Near complete: NA Incomplete: NA Cannot be determined (specify): NA Macroscopic tumor perforation: Muscularis propria Invasive tumor: Maximum size: 2.1 cm Histologic type(s): Adenocarcinoma Histologic grade and differentiation: G1: well differentiated/low grade G2: moderately differentiated/low grade G3: poorly differentiated/high grade G4: undifferentiated/high grade Type of polyp in which invasive carcinoma arose: Tubular adenoma Microscopic extension of invasive tumor: Muscularis propria Lymph-Vascular invasion: Negative Peri-neural invasion: Negative Tumor deposit(s) (discontinuous extramural extension): Negative Resection margins: Proximal margin: Negative Distal margin: Negative Circumferential (radial) (posterior ascending, posterior descending; lateral and posterior mid-rectum; and entire lower 1/3 rectum):Negative Mesenteric margin (sigmoid and transverse): Negative Distance closest margin (if all above margins negative): 3.2 cm Trans-anal resection margins only: Deep margin: NA Mucosal Margin: NA Distance closest mucosal margin (if negative): NA Treatment effect (neo-adjuvant therapy): Partial Additional polyp(s): Negative Non-neoplastic findings: Unremarkable Lymph nodes: number examined 4; number positive: 2 Pathologic Staging: ypT2, ypN1, Mx Ancillary studies: Per request   PROCEDURES  Colonoscopy 09/13/17 IMPRESSION:  - Patent end-to-end colo-colonic anastomosis, characterized by healthy appearing mucosa. - The examination was otherwise normal on direct views. - No specimens collected.  EUS by Dr. Ardis Hughs 03/25/2016 Endosonographic Finding 1. The mass above correlated with a hyoechoic mass that clearly invades into and through the muscularis propria layer of the rectal wall (uT3). 2. The was one small (31m) suspicious perirectal  lymphnode that was suspicious for malignant involvement (uN1) 3. The right obturator lymphnode that was described on recent CT scan was not visible on this exam 5cm long, non-circumferential uT3N1 (stage IIIb) proximal rectal adenocarcinoma with distal edge located 9-10cm from the anal verge. Following EUS staging, the mass was labeled with submucosal injection of SPOT.  COLONOSCOPY by Dr. SFuller Plan5/03/2016 IMPRESSION: - Malignant partially obstructing tumor in the proximal rectum. Biopsied. - One 10 mm polyp in the sigmoid colon, removed with a hot snare. Resected and retrieved. - Internal hemorrhoids.   RADIOGRAPHIC STUDIES: I have personally reviewed the radiological images as listed and agreed with the findings in the report. Ct Abdomen Pelvis W Contrast  Result Date: 02/22/2018 CLINICAL DATA:  Restaging of rectal cancer diagnosed in 2017. Resection with oral and IV chemotherapy as well as radiation therapy completed. EXAM: CT ABDOMEN AND PELVIS WITH CONTRAST TECHNIQUE: Multidetector CT imaging of the abdomen and pelvis was performed using the standard protocol following bolus administration of intravenous contrast. CONTRAST:  1039mOMNIPAQUE IOHEXOL 300 MG/ML  SOLN COMPARISON:  03/23/2016 FINDINGS: Lower chest: Small type 1 hiatal hernia. Mild atherosclerotic calcification of the descending thoracic aorta. Hepatobiliary: Mild dependent density in the gallbladder could represent sludge or gallstones. No biliary dilatation. No focal liver lesion identified. Pancreas: Unremarkable Spleen: Unremarkable Adrenals/Urinary Tract: 1.1 by 0.8 cm left kidney upper pole hypodense lesion stable from 03/23/2016 and likely a cyst. Small peripelvic cysts. Stomach/Bowel: The appendix is low-density and mildly distended with maximum diameter of 9 mm. No periappendiceal stranding. Similar low-density although less striking size previously. Anastomotic staple line in the lower rectum observed, with a suggestion of  local circumferential wall thickening in this vicinity but no discrete asymmetric mass. There is surrounding presacral indistinct density and also faint stranding extending  around the margins of the perirectal space. Vascular/Lymphatic: Aortoiliac atherosclerotic vascular disease. 1.3 cm in short axis right obturator lymph node, previously 1.5 cm. Prior perirectal lymph nodes have resolved. Reproductive: Indistinct margins along the seminal vesicles, likely therapy related. Upper normal prostate size. Other: No supplemental non-categorized findings. Musculoskeletal: Probable bone island at L3, stable. Degenerative disc disease at L5-S1 with loss of disc height. No appreciable impingement. Vertebral hemangioma at T9. IMPRESSION: 1. Postoperative findings in the lower rectum, with post therapy related findings in the region including stranding along the perirectal space margins and presacral stranding. The only potential finding of concern is a 1.3 cm in short axis right obturator lymph node, previously measured at 1.5 cm. No focal liver lesion. 2. Abnormal low-density and distention of the appendix with a 9 mm diameter. No periappendiceal stranding. Appearance concerning for appendiceal mucocele. Surgical consultation recommended. 3. Other imaging findings of potential clinical significance: Small type 1 hiatal hernia. Aortic Atherosclerosis (ICD10-I70.0). Dependent sludge or gallstones in the gallbladder. Electronically Signed   By: Van Clines M.D.   On: 02/22/2018 10:26   CT chest w/ contrast 03/26/16 IMPRESSION: 1. No definite metastatic disease in the chest. The patient has scattered calcified granulomata in both lungs with two very tiny non calcified nodules identified. These noncalcified lesions are also likely benign, but close attention on follow-up recommended.    ASSESSMENT & PLAN: 67 y.o. male, presented with mild intermittent rectal bleeding over a 4 months span.  1. Proximal rectal  adenocarcinoma, uT3N1M0, stag IIIB, ypT2N1M0,  MMR normal -I previously discussed his CT scan finding, colonoscopy and biopsy results in great details with patient and his wife. -Staging results was previously discussed with him also, the risk of cancer recurrence for stage III rectal cancer is high, the standard care is neoadjuvant chemotherapy and radiation, followed by surgery and adjuvant chemotherapy. This was discussed with him in great details -He has completed neoadjuvant chemoradiation with Xeloda, tolerated well overall. -I previously reviewed his surgical pathology findings. He had a partial response to neoadjuvant chemoradiation, still has significant residual disease, especially with 2 positive lymph nodes (out of 4) -We previously discussed the role of adjuvant chemotherapy after surgery. Giving the significant residual disease, especially positive lymph nodes, I recommend him to have adjuvant chemotherapy with FOLFOX or CAPOX. He opted CAPOX, for a total of 4 cycles.  -Due to his moderate side effects from Oxaliplatin, especially hypersensitive reaction after second infusion, Oxaliplatin has been held after cycle 2, He continues Xeloda for additional 3 cycles and completed adjuvant chemo in 12/2016. -I reviewed his surveillance CT scan from 06/20/2017, which showed no evidence of recurrence. The right gluteal lymph node has decreased in size, likely benign. Other incidental findings were discussed with patient.  -He had colonoscopy on 09/13/17 which showed no polyps and overall normal exam. Will repeat in 3-5 years.  -We discussed his CT AP from 02/21/18 which shows no evidence of disease. Does show small lymph node has shrunk in size, we will continue to monitor.  -CT scan also shows his appendix is mildly larger than normal with mucus inside. I discussed appendiceal cancer can present this same way, but there is no high suspicion of this. Surgical consultation is recommended.  -Next CT scan  in 1 year. Will scan earlier if there is significant increase in tumor marker or he does not undergo appendectomy.  -He is clinically doing well. He has mild residual neuropathy in his toes, improving. Labs reviewed and overall WNL. Exam  is unremarkable. There is no clinical concern for recurrence. -He is 2 year from diagnosis so I will follow up with labs and exams every 4-5 months this year then every 6 months next year. After 3 years will do a scan every 1-2 years.  -We discussed long-term side effects from his previous treatment.  I recommend him to consider physical therapy with pelvic exercise, he does not have much issue with his bowel and bladder, declined.  His peripheral neuropathy has overall much improved, minimal now. -He expressed concern about his low libido post treatment. I offered him to see a urologist to discuss medication. I recommend [facility], he will think about it.  -F/u in 5 months.  He will see Dr. Marcello Moores next months.  2.  Enlarged appendix -His last few scan has showed slightly enlarged appendix with mucus inside, overall stable. This is likely benign, however appendiceal malignancy is not ruled out completely.  Discussed the risk of appendix rupture -He previously discussed with Dr. Marcello Moores, who recommended appendectomy.  I encouraged him to consider.  He is going to see Dr. Marcello Moores next months.  Plan  -lab and scan reviewed, NED -Lab and f/u in 5 months -He will think about urology referral for his low libido -He will see Dr. Marcello Moores next month, possiblly have appendectomy    All questions were answered. The patient knows to call the clinic with any problems, questions or concerns.  I spent 20 minutes counseling the patient face to face. The total time spent in the appointment was 25 minutes and more than 50% was on counseling.  This document serves as a record of services personally performed by Truitt Merle, MD. It was created on her behalf by Joslyn Devon, a trained medical scribe. The creation of this record is based on the scribe's personal observations and the provider's statements to them.   I have reviewed the above documentation for accuracy and completeness, and I agree with the above.    Truitt Merle, MD 02/23/2018

## 2018-02-23 ENCOUNTER — Inpatient Hospital Stay (HOSPITAL_BASED_OUTPATIENT_CLINIC_OR_DEPARTMENT_OTHER): Payer: Medicare Other | Admitting: Hematology

## 2018-02-23 ENCOUNTER — Telehealth: Payer: Self-pay | Admitting: Hematology

## 2018-02-23 ENCOUNTER — Encounter: Payer: Self-pay | Admitting: Hematology

## 2018-02-23 ENCOUNTER — Other Ambulatory Visit: Payer: Medicare Other

## 2018-02-23 VITALS — BP 148/82 | HR 54 | Temp 98.1°F | Resp 18 | Ht 76.0 in | Wt 224.2 lb

## 2018-02-23 DIAGNOSIS — Z923 Personal history of irradiation: Secondary | ICD-10-CM | POA: Diagnosis not present

## 2018-02-23 DIAGNOSIS — G620 Drug-induced polyneuropathy: Secondary | ICD-10-CM

## 2018-02-23 DIAGNOSIS — Z7982 Long term (current) use of aspirin: Secondary | ICD-10-CM

## 2018-02-23 DIAGNOSIS — R6882 Decreased libido: Secondary | ICD-10-CM | POA: Diagnosis not present

## 2018-02-23 DIAGNOSIS — R530 Neoplastic (malignant) related fatigue: Secondary | ICD-10-CM | POA: Diagnosis not present

## 2018-02-23 DIAGNOSIS — Z9221 Personal history of antineoplastic chemotherapy: Secondary | ICD-10-CM

## 2018-02-23 DIAGNOSIS — Z87891 Personal history of nicotine dependence: Secondary | ICD-10-CM

## 2018-02-23 DIAGNOSIS — Z79899 Other long term (current) drug therapy: Secondary | ICD-10-CM | POA: Diagnosis not present

## 2018-02-23 DIAGNOSIS — C19 Malignant neoplasm of rectosigmoid junction: Secondary | ICD-10-CM

## 2018-02-23 DIAGNOSIS — C20 Malignant neoplasm of rectum: Secondary | ICD-10-CM

## 2018-02-23 NOTE — Telephone Encounter (Signed)
Scheduled appt per 4/11 los - Gave patient AVS and calender per los.  

## 2018-03-21 DIAGNOSIS — Z85048 Personal history of other malignant neoplasm of rectum, rectosigmoid junction, and anus: Secondary | ICD-10-CM | POA: Diagnosis not present

## 2018-07-25 NOTE — Progress Notes (Signed)
[facility]  [phone] [phone]  Clinic Follow Up Note   Patient Care Team: Jermaine Dus, MD as PCP - General (Family Medicine) Jermaine Artist, MD as Consulting Physician (Gastroenterology) Jermaine Ruff, MD as Consulting Physician (General Surgery) Jermaine Merle, MD as Consulting Physician (Hematology) Jermaine Rudd, MD as Consulting Physician (Radiation Oncology) Tania Ade, RN as Registered Nurse Jermaine Boston, MD as Consulting Physician (General Surgery)   Date of Service:  07/27/2018   CHIEF COMPLAINTS:  Follow up proximal rectal cancer  Oncology History   Cancer Staging Rectal cancer ypT2ypN1cM0 s/p robotic LAR 08/04/2016 Staging form: Colon and Rectum, AJCC 7th Edition - Clinical stage from 03/19/2016: Stage IIIB (T3, N1, M0) - Signed by Jermaine Merle, MD on 04/02/2016 - Pathologic stage from 08/04/2016: Stage IIIA (T2, N1b, cM0) - Signed by Jermaine Merle, MD on 08/26/2016        Rectal cancer ypT2ypN1cM0 s/p robotic LAR 08/04/2016   03/19/2016 Initial Diagnosis    Rectal cancer ([place])    03/19/2016 Initial Biopsy    Rectal mass biopsy showed invasive adenocarcinoma. Sigmoid colon polyps showed tubular adenoma.     03/19/2016 Procedure    Colonoscopy showed a fungating partially obstructing mass in the proximal rectum, partially circumferential involving two thirds of the lumen circumference, measuring 6 x 4 cm, 10-16 cm from the anal verge. A 10 mm polyps was found in the sigmoid colon.    03/22/2016 Tumor Marker    CEA=1.1    03/23/2016 Imaging    CT chest, abdomen and pelvis with contrast showed asymmetric rectal mass approximately 10 cm from the anus, 2 suspicious lymph nodes in the deep pelvis concerning for local nodal metastasis. No other evidence of metastatic disease.    03/25/2016 Procedure    EUS showed a T3 proximal rectal mass, there was one small 6 mm suspicious for rectal node that was suspicious for malignancy (uN1)    04/14/2016  - 05/24/2016 Chemotherapy    Xeloda 1800 mg every 12 hours, Monday through Friday, with concurrent radiation    04/14/2016 - 05/24/2016 Radiation Therapy     Adjuvant irradiation to his rectal cancer.    08/04/2016 Surgery    Robotic-assisted low anterior resection of rectal cancer.    08/04/2016 Pathology Results    Rectosigmoid segmental resection showed adenocarcinoma of the rectum, grade 2, tumor invades muscularis propria, margins were negative. Metastatic adenocarcinoma was seen treatment effect in 2 lymph nodes, a total of 4 nodes.      09/09/2016 - 12/23/2016 Chemotherapy    Adjuvant chemo with CAPOX. He has hypersensitive reaction to oxaliplatin (difficulty breathing), and oxaliplatin was held after cycle 2, changed to Xeloda 1058m/m2 (25061min am and 200064mn pm), 2 weeks on and one week off, for additional 3 cycles      06/20/2017 Imaging    CT CAP W Contrast 06/20/17  IMPRESSION: 1. Post therapy related findings in the pelvis including anastomotic staple line in the rectum, along with perirectal and presacral density likely the result of prior radiation therapy. 2. Oval-shaped right inferior gluteal lymph node 1.8 by 1.3 cm, formerly 1.5 by 2.3 cm. 3. Mildly prominent in size appendix, somewhat low-density, appendiceal tip diameter 1.0 cm. Difficult to exclude appendiceal mucocele, surveillance versus appendectomy suggested. 4. New lucency along the right posterior inferior margin of the L4 vertebral body, probably a Schmorl's node given the contiguity with the disc. 5. Small to moderate-sized type 1 hiatal hernia. 6.  Aortic Atherosclerosis (ICD10-I70.0).  09/13/2017 Procedure    Colonoscopy 09/13/17 IMPRESSION:  - Patent end-to-end colo-colonic anastomosis, characterized by healthy appearing mucosa. - The examination was otherwise normal on direct views. - No specimens collected.    02/21/2018 Imaging    CT AP W Contrast 02/21/18 IMPRESSION: 1. Postoperative findings  in the lower rectum, with post therapy related findings in the region including stranding along the perirectal space margins and presacral stranding. The only potential finding of concern is a 1.3 cm in short axis right obturator lymph node, previously measured at 1.5 cm. No focal liver lesion. 2. Abnormal low-density and distention of the appendix with a 9 mm diameter. No periappendiceal stranding. Appearance concerning for appendiceal mucocele. Surgical consultation recommended. 3. Other imaging findings of potential clinical significance: Small type 1 hiatal hernia. Aortic Atherosclerosis (ICD10-I70.0). Dependent sludge or gallstones in the gallbladder.     HISTORY OF PRESENTING ILLNESS (04/02/2016):  Jermaine Smith 67 y.o. male is here because of his recently diagnosed rectal adenocarcinoma. He is accompanied by his wife to our multidisciplinary GI clinic today.  He has been having rectal bleeding for the past 4 months, intermittent, small amount, mixed with stool, BM is regular, but smaller caliber, no rectal pain, nausea, bloating, he has lost 5 lbs during his recent colonoscopy procedures, his appetite is normal, has been eating less, since the diagnosis of cancer. His appetite and energy level are normal. He saw gastroenterologist Dr. Fuller Plan in the past for his GERD, and self-referred back to Dr. Fuller Plan and underwent colonoscopy in early May 2017. The colonoscopy showed a polyp in sigmoid colon, and a fungating mass in the proximal rectum, biopsy showed adenocarcinoma. He underwent EUS Colonoscopy, which showed a T3 N1 disease. CT scan was negative for distant metastasis.  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 He has been very healthy, only takes PPI for acid reflux and baby aspirin. Exercise regularly. He feels well overall.  CURRENT THERAPY: Surveillance  INTERIM HISTORY:  [name] returns for follow up. He is here with his wife. He is follow-up with  his PCP with PSA measurements. He is doing well and has no concerns. No abdominal pain, changes in BMs or appetite. His energy level is good and is almost 90-95% back. He is able to perform his daily activities and prefers to nap during the day. He states that he was moving very quickly from one place to another this morning, and therefore his BP is elevated. He states that his BP is usually controlled. His peripheral neuropathy has almost improved with minimal numbness in toes only. He still drops small things, but doesn't fall.  He says that Dr. Marcello Moores decided not to do appendectomy due to risk. He was educated about warming symptoms and knows when to contact Dr. Marcello Moores. Last colonoscopy was in October 2018.    MEDICAL HISTORY:  Past Medical History:  Diagnosis Date  . FHx: brain aneurysm   . GERD (gastroesophageal reflux disease)   . Hiatal hernia   . Left inguinal hernia   . Rectal cancer ([place])   . Skin cancer    hx basal cell carcinoma 08/31/17 pt denies/AW    SURGICAL HISTORY: Past Surgical History:  Procedure Laterality Date  . CLAVICLE SURGERY    . EUS N/A 03/25/2016   Procedure: LOWER ENDOSCOPIC ULTRASOUND (EUS);  Surgeon: Milus Banister, MD;  Location: [facility];  Service: Endoscopy;  Laterality: N/A;  . [place]  open right inguinal age 70 y/o  . lap bilateral ing. hernia repair Bilateral 05/09/12  . XI ROBOTIC ASSISTED LOWER ANTERIOR RESECTION N/A 08/04/2016   Procedure: XI ROBOTIC ASSISTED LOWER ANTERIOR RESECTION WITH RIGID PROCTOSCOPY;  Surgeon: Jermaine Ruff, MD;  Location: [facility];  Service: General;  Laterality: N/A;    SOCIAL HISTORY: Social History   Socioeconomic History  . Marital status: Married    Spouse name: Not on file  . Number of children: 2  . Years of education: Not on file  . Highest education level: Not on file  Occupational History  . Occupation: endoscope specialist    Employer: [employer]    Comment: Works on  [place]  . Financial resource strain: Not on file  . Food insecurity:    Worry: Not on file    Inability: Not on file  . Transportation needs:    Medical: Not on file    Non-medical: Not on file  Tobacco Use  . Smoking status: Former Smoker    Packs/day: 0.50    Years: 5.00    Pack years: 2.50    Types: Cigarettes    Last attempt to quit: 09/03/1978    Years since quitting: 39.9  . Smokeless tobacco: Never Used  Substance and Sexual Activity  . Alcohol use: Yes    Alcohol/week: 0.0 standard drinks    Comment: ocassional, once a month   . Drug use: No    Comment: still smoking 1ppd,had quit in 1978,   . Sexual activity: Not on file  Lifestyle  . Physical activity:    Days per week: Not on file    Minutes per session: Not on file  . Stress: Not on file  Relationships  . Social connections:    Talks on phone: Not on file    Gets together: Not on file    Attends religious service: Not on file    Active member of club or organization: Not on file    Attends meetings of clubs or organizations: Not on file    Relationship status: Not on file  . Intimate partner violence:    Fear of current or ex partner: Not on file    Emotionally abused: Not on file    Physically abused: Not on file    Forced sexual activity: Not on file  Other Topics Concern  . 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   FAMILY HISTORY: Family History  Problem Relation Age of Onset  . Heart disease Father   . Clotting disorder Mother        PE  . Hypotension Mother   . Colon cancer Neg Hx                              He has two adopted children, 78 and 71 yo  ALLERGIES:  has No Known Allergies.  MEDICATIONS:  Current Outpatient Medications  Medication Sig Dispense Refill  .  aspirin 81 MG tablet Take 81 mg by mouth every morning.     . Cholecalciferol (VITAMIN D PO) Take 1 tablet by mouth daily.    Marland Kitchen dexlansoprazole (DEXILANT) 60 MG capsule Take 60 mg by mouth every morning.     Marland Kitchen [place]  Take by mouth.    . Multiple Vitamin (MULTIVITAMIN) tablet Take 1 tablet by mouth every morning.      No current facility-administered medications for this visit.     REVIEW OF SYSTEMS:   Constitutional: Denies fevers, chills or abnormal night sweats (+) improved energy level and appetite Eyes: Denies blurriness of vision, double vision or watery eyes Ears, nose, mouth, throat, and face: Denies mucositis or sore throat Respiratory: Denies cough, dyspnea or wheezes Cardiovascular: Denies palpitation, chest discomfort or lower extremity swelling Gastrointestinal:  Denies nausea, heartburn or change in bowel habits Skin: Denies abnormal skin rashes Lymphatics: Denies new lymphadenopathy or easy bruising Neurological:Denies numbness, new weaknesses  (+) change in short term memory (+) minimal residual neuropathy in fingers and toes. Drops things but doesn't fall. Behavioral/Psych: Mood is stable, no new changes  All other systems were reviewed with the patient and are negative.  PHYSICAL EXAMINATION: ECOG PERFORMANCE STATUS: 0  Vitals:   07/27/18 1025  BP: (!) 154/98  Pulse: 68  Resp: 17  Temp: 97.8 F (36.6 C)  SpO2: 98%   Filed Weights   07/27/18 1025  Weight: 223 lb 9.6 oz (101.4 kg)     GENERAL:alert, no distress and comfortable SKIN: skin color, texture, turgor are normal EYES: normal, conjunctiva are pink and non-injected, sclera clear OROPHARYNX:no exudate, no erythema and lips, buccal mucosa, and tongue normal  NECK: supple, thyroid normal size, non-tender, without nodularity LYMPH:  no palpable lymphadenopathy in the cervical, axillary or inguinal LUNGS: clear to auscultation and percussion with normal breathing effort HEART: regular  rate & rhythm and no murmurs and no lower extremity edema ABDOMEN: abdomen soft, non-tender and normal bowel sounds, No organomegaly. Rectal exam was normal today  Musculoskeletal:no cyanosis of digits and no clubbing  Rectal: No masses or blood on glove. PSYCH: alert & oriented x 3 with fluent speech NEURO: no focal motor/sensory deficits  LABORATORY DATA:  I have reviewed the data as listed CBC Latest Ref Rng & Units 07/27/2018 02/21/2018 10/27/2017  WBC 4.0 - 10.3 K/uL 4.1 4.4 4.1  Hemoglobin 13.0 - 17.1 g/dL 13.4 13.9 13.2  Hematocrit 38.4 - 49.9 % 38.0(L) 39.8 37.4(L)  Platelets 140 - 400 K/uL 141 154 152    CMP Latest Ref Rng & Units 02/21/2018 10/27/2017 06/20/2017  Glucose 70 - 140 mg/dL 95 86 101  BUN 7 - 26 mg/dL 17 15.7 17.1  Creatinine 0.70 - 1.30 mg/dL 1.07 1.0 1.1  Sodium 136 - 145 mmol/L 137 140 138  Potassium 3.5 - 5.1 mmol/L 4.3 4.1 4.3  Chloride 98 - 109 mmol/L 106 - -  CO2 22 - 29 mmol/L 23 21(L) 24  Calcium 8.4 - 10.4 mg/dL 9.6 9.2 9.8  Total Protein 6.4 - 8.3 g/dL 7.2 6.9 7.1  Total Bilirubin 0.2 - 1.2 mg/dL 1.1 1.03 1.06  Alkaline Phos 40 - 150 U/L 94 85 104  AST 5 - 34 U/L 25 25 27   ALT 0 - 55 U/L 29 21 25    Tumor Marker CEA (ng/ml) 03/19/2016: 1.1 04/15/2016: 1.9 08/26/2016: 1.2  12/02/2016: 3.2 06/20/17: 1.31 10/27/17: 1.70 02/21/18: < 1.00 07/27/2018: 1.47    PATHOLOGY  Diagnosis 03/19/2016 1. Colon, polyp(s), sigmoid - TUBULAR ADENOMA(S). - HIGH GRADE DYSPLASIA IS NOT IDENTIFIED. 2. Rectum, biopsy, mass - INVASIVE ADENOCARCINOMA.  Diagnosis 08/04/2016 1. Colon, segmental resection for tumor, rectosigmoid ADENOCARCINOMA OF THE RECTUM, GRADE 2, THE TUMOR INVADES MUSCULARIS PROPRIA ALL MARGINS OF RESECTION ARE NEGATIVE FOR CARCINOMA METASTATIC ADENOCARCINOMA WITH TREATMENT  EFFECT IN TWO LYMPH NODES (2/4) 2. Colon, resection margin (donut), final distal BENIGN COLONIC TISSUE Microscopic Comment 1. COLON AND RECTUM (INCLUDING TRANS-ANAL  RESECTION): Specimen: Rectosigmoid colon Procedure: Segmental resection Tumor site: Rectum Specimen integrity: Intact Macroscopic intactness of mesorectum: Not applicable: NA Complete: x Near complete: NA Incomplete: NA Cannot be determined (specify): NA Macroscopic tumor perforation: Muscularis propria Invasive tumor: Maximum size: 2.1 cm Histologic type(s): Adenocarcinoma Histologic grade and differentiation: G1: well differentiated/low grade G2: moderately differentiated/low grade G3: poorly differentiated/high grade G4: undifferentiated/high grade Type of polyp in which invasive carcinoma arose: Tubular adenoma Microscopic extension of invasive tumor: Muscularis propria Lymph-Vascular invasion: Negative Peri-neural invasion: Negative Tumor deposit(s) (discontinuous extramural extension): Negative Resection margins: Proximal margin: Negative Distal margin: Negative Circumferential (radial) (posterior ascending, posterior descending; lateral and posterior mid-rectum; and entire lower 1/3 rectum):Negative Mesenteric margin (sigmoid and transverse): Negative Distance closest margin (if all above margins negative): 3.2 cm Trans-anal resection margins only: Deep margin: NA Mucosal Margin: NA Distance closest mucosal margin (if negative): NA Treatment effect (neo-adjuvant therapy): Partial Additional polyp(s): Negative Non-neoplastic findings: Unremarkable Lymph nodes: number examined 4; number positive: 2 Pathologic Staging: ypT2, ypN1, Mx Ancillary studies: Per request   PROCEDURES  Colonoscopy 09/13/17 IMPRESSION:  - Patent end-to-end colo-colonic anastomosis, characterized by healthy appearing mucosa. - The examination was otherwise normal on direct views. - No specimens collected.  EUS by Dr. Ardis Hughs 03/25/2016 Endosonographic Finding 1. The mass above correlated with a hyoechoic mass that clearly invades into and through the muscularis propria layer of the  rectal wall (uT3). 2. The was one small (56m) suspicious perirectal lymphnode that was suspicious for malignant involvement (uN1) 3. The right obturator lymphnode that was described on recent CT scan was not visible on this exam 5cm long, non-circumferential uT3N1 (stage IIIb) proximal rectal adenocarcinoma with distal edge located 9-10cm from the anal verge. Following EUS staging, the mass was labeled with submucosal injection of SPOT.  COLONOSCOPY by Dr. SFuller Plan5/03/2016 IMPRESSION: - Malignant partially obstructing tumor in the proximal rectum. Biopsied. - One 10 mm polyp in the sigmoid colon, removed with a hot snare. Resected and retrieved. - Internal hemorrhoids.   RADIOGRAPHIC STUDIES: I have personally reviewed the radiological images as listed and agreed with the findings in the report.  02/22/2018 CT Abdomen IMPRESSION: 1. Postoperative findings in the lower rectum, with post therapy related findings in the region including stranding along the perirectal space margins and presacral stranding. The only potential finding of concern is a 1.3 cm in short axis right obturator lymph node, previously measured at 1.5 cm. No focal liver lesion. 2. Abnormal low-density and distention of the appendix with a 9 mm diameter. No periappendiceal stranding. Appearance concerning for appendiceal mucocele. Surgical consultation recommended. 3. Other imaging findings of potential clinical significance: Small type 1 hiatal hernia. Aortic Atherosclerosis (ICD10-I70.0). Dependent sludge or gallstones in the gallbladder  06/20/2017 CT CAP IMPRESSION: 1. Post therapy related findings in the pelvis including anastomotic staple line in the rectum, along with perirectal and presacral density likely the result of prior radiation therapy. 2. Oval-shaped right inferior gluteal lymph node 1.8 by 1.3 cm, formerly 1.5 by 2.3 cm. 3. Mildly prominent in size appendix, somewhat low-density, appendiceal tip  diameter 1.0 cm. Difficult to exclude appendiceal mucocele, surveillance versus appendectomy suggested. 4. New lucency along the right posterior inferior margin of the L4 vertebral body, probably a Schmorl's node given the contiguity with the disc. 5. Small to moderate-sized type 1 hiatal hernia. 6.  Aortic Atherosclerosis (  ICD10-I70.0).  CT chest w/ contrast 03/26/16 IMPRESSION: 1. No definite metastatic disease in the chest. The patient has scattered calcified granulomata in both lungs with two very tiny non calcified nodules identified. These noncalcified lesions are also likely benign, but close attention on follow-up recommended.    ASSESSMENT & PLAN:   67 y.o. male, presented with mild intermittent rectal bleeding over a 4 months span.  1. Proximal rectal adenocarcinoma, uT3N1M0, stag IIIB, ypT2N1M0,  MMR normal -I previously discussed his CT scan finding, colonoscopy and biopsy results in great details with patient and his wife. -Staging results was previously discussed with him also, the risk of cancer recurrence for stage III rectal cancer is high, the standard care is neoadjuvant chemotherapy and radiation, followed by surgery and adjuvant chemotherapy. This was discussed with him in great details -He has completed neoadjuvant chemoradiation with Xeloda, tolerated well overall. -I previously reviewed his surgical pathology findings. He had a partial response to neoadjuvant chemoradiation, still has significant residual disease, especially with 2 positive lymph nodes (out of 4) -We previously discussed the role of adjuvant chemotherapy after surgery. Giving the significant residual disease, especially positive lymph nodes, I recommend him to have adjuvant chemotherapy with FOLFOX or CAPOX. He opted CAPOX, for a total of 4 cycles.  -Due to his moderate side effects from Oxaliplatin, especially hypersensitive reaction after second infusion, Oxaliplatin has been held after cycle 2, He  continues Xeloda for additional 3 cycles and completed adjuvant chemo in 12/2016. -I reviewed his surveillance CT scan from 06/20/2017, which showed no evidence of recurrence. The right gluteal lymph node has decreased in size, likely benign. Other incidental findings were discussed with patient.  -He had colonoscopy on 09/13/17 which showed no polyps and overall normal exam. Will repeat in 3-5 years.  -We discussed his CT AP from 02/21/18 which shows no evidence of disease. Does show small lymph node has shrunk in size, we will continue to monitor.  -CT scan also shows his appendix is mildly larger than normal with mucus inside. I discussed appendiceal cancer can present this same way, but there is no high suspicion of this. He discussed with Dr. Marcello Moores, who did not recommend surgery. Will continue monitor  -He is clinically doing well. His energy level is not the same as before but has tremendously improved.  Labs reviewed and overall WNL. Exam is unremarkable. There is no clinical concern for recurrence.  -His peripheral neuropathy has almost improved with minimal numbness in toes only. He still drops small things, but doesn't fall. -Last colonoscopy was October 2018. -F/u in 5 months with labs and CT CAP scan before.  2.  Enlarged appendix -His last few scan has showed slightly enlarged appendix with mucus inside, overall stable. This is likely benign, however appendiceal malignancy is not ruled out completely.  Discussed the risk of appendix rupture -He previously discussed with Dr. Marcello Moores, who recommended appendectomy.  I encouraged him to consider. -Dr. Marcello Moores recommended not to have appendectomy.   Plan  -he is clinically doing well, no concern for recurrence  -Continue cancer surveillance, f/u in 5 months with labs and CT CAP scan before.    All questions were answered. The patient knows to call the clinic with any problems, questions or concerns.  I spent 15 minutes counseling the  patient face to face. The total time spent in the appointment was 20 minutes and more than 50% was on counseling.  Dierdre Searles Dweik am acting as scribe for Dr. Truitt Smith.  I have reviewed the above documentation for accuracy and completeness, and I agree with the above.     Natale Lay [facility] 07/27/2018

## 2018-07-27 ENCOUNTER — Inpatient Hospital Stay: Payer: Medicare Other | Attending: Hematology | Admitting: Hematology

## 2018-07-27 ENCOUNTER — Other Ambulatory Visit: Payer: Self-pay

## 2018-07-27 ENCOUNTER — Telehealth: Payer: Self-pay

## 2018-07-27 ENCOUNTER — Encounter: Payer: Self-pay | Admitting: Hematology

## 2018-07-27 ENCOUNTER — Inpatient Hospital Stay: Payer: Medicare Other

## 2018-07-27 VITALS — BP 154/98 | HR 68 | Temp 97.8°F | Resp 17 | Ht 76.0 in | Wt 223.6 lb

## 2018-07-27 DIAGNOSIS — D509 Iron deficiency anemia, unspecified: Secondary | ICD-10-CM

## 2018-07-27 DIAGNOSIS — C20 Malignant neoplasm of rectum: Secondary | ICD-10-CM | POA: Insufficient documentation

## 2018-07-27 DIAGNOSIS — Z9221 Personal history of antineoplastic chemotherapy: Secondary | ICD-10-CM | POA: Insufficient documentation

## 2018-07-27 DIAGNOSIS — Z87891 Personal history of nicotine dependence: Secondary | ICD-10-CM

## 2018-07-27 DIAGNOSIS — Z923 Personal history of irradiation: Secondary | ICD-10-CM | POA: Diagnosis not present

## 2018-07-27 DIAGNOSIS — G620 Drug-induced polyneuropathy: Secondary | ICD-10-CM | POA: Diagnosis not present

## 2018-07-27 DIAGNOSIS — Z7982 Long term (current) use of aspirin: Secondary | ICD-10-CM | POA: Insufficient documentation

## 2018-07-27 DIAGNOSIS — Z79899 Other long term (current) drug therapy: Secondary | ICD-10-CM | POA: Diagnosis not present

## 2018-07-27 DIAGNOSIS — K219 Gastro-esophageal reflux disease without esophagitis: Secondary | ICD-10-CM | POA: Diagnosis not present

## 2018-07-27 LAB — CBC WITH DIFFERENTIAL/PLATELET
BASOS ABS: 0 10*3/uL (ref 0.0–0.1)
BASOS PCT: 0 %
EOS ABS: 0.1 10*3/uL (ref 0.0–0.5)
Eosinophils Relative: 2 %
HCT: 38 % — ABNORMAL LOW (ref 38.4–49.9)
Hemoglobin: 13.4 g/dL (ref 13.0–17.1)
Lymphocytes Relative: 18 %
Lymphs Abs: 0.8 10*3/uL — ABNORMAL LOW (ref 0.9–3.3)
MCH: 30.3 pg (ref 27.2–33.4)
MCHC: 35.3 g/dL (ref 32.0–36.0)
MCV: 86 fL (ref 79.3–98.0)
Monocytes Absolute: 0.5 10*3/uL (ref 0.1–0.9)
Monocytes Relative: 13 %
Neutro Abs: 2.7 10*3/uL (ref 1.5–6.5)
Neutrophils Relative %: 67 %
PLATELETS: 141 10*3/uL (ref 140–400)
RBC: 4.42 MIL/uL (ref 4.20–5.82)
RDW: 13.2 % (ref 11.0–14.6)
WBC: 4.1 10*3/uL (ref 4.0–10.3)

## 2018-07-27 LAB — IRON AND TIBC
IRON: 76 ug/dL (ref 42–163)
SATURATION RATIOS: 26 % — AB (ref 42–163)
TIBC: 298 ug/dL (ref 202–409)
UIBC: 221 ug/dL

## 2018-07-27 LAB — COMPREHENSIVE METABOLIC PANEL
ALT: 18 U/L (ref 0–44)
ANION GAP: 8 (ref 5–15)
AST: 19 U/L (ref 15–41)
Albumin: 4.2 g/dL (ref 3.5–5.0)
Alkaline Phosphatase: 82 U/L (ref 38–126)
BUN: 18 mg/dL (ref 8–23)
CO2: 26 mmol/L (ref 22–32)
CREATININE: 1.16 mg/dL (ref 0.61–1.24)
Calcium: 9.8 mg/dL (ref 8.9–10.3)
Chloride: 105 mmol/L (ref 98–111)
GFR calc Af Amer: >60 mL/min (ref >60)
Glucose, Bld: 98 mg/dL (ref 70–99)
Potassium: 4.5 mmol/L (ref 3.5–5.1)
Sodium: 139 mmol/L (ref 135–145)
TOTAL PROTEIN: 7.1 g/dL (ref 6.5–8.1)
Total Bilirubin: 1 mg/dL (ref 0.3–1.2)

## 2018-07-27 LAB — CEA (IN HOUSE-CHCC): CEA (CHCC-IN HOUSE): 1.47 ng/mL (ref 0.00–5.00)

## 2018-07-27 LAB — FERRITIN: Ferritin: 58 ng/mL (ref 24–336)

## 2018-07-27 NOTE — Telephone Encounter (Signed)
Printed avs and calender of upcoming appointment. Per 9/12 los 

## 2018-07-31 ENCOUNTER — Telehealth: Payer: Self-pay

## 2018-07-31 NOTE — Telephone Encounter (Signed)
Left message regarding lab results WNL and no concerns per Dr. Burr Medico.

## 2018-07-31 NOTE — Telephone Encounter (Signed)
-----   Message from Truitt Merle, MD sent at 07/29/2018 10:22 AM EDT ----- Please let pt know the lab results, CBC, CMP, iron study are WNL, no concerns, thanks   Truitt Merle  07/29/2018

## 2018-09-07 DIAGNOSIS — Z23 Encounter for immunization: Secondary | ICD-10-CM | POA: Diagnosis not present

## 2018-12-26 ENCOUNTER — Ambulatory Visit (HOSPITAL_COMMUNITY)
Admission: RE | Admit: 2018-12-26 | Discharge: 2018-12-26 | Disposition: A | Discharge status: Home / Self Care | Payer: Medicare Other | Source: Ambulatory Visit | Attending: Hematology | Admitting: Hematology

## 2018-12-26 ENCOUNTER — Inpatient Hospital Stay: Payer: Medicare Other | Attending: Hematology

## 2018-12-26 DIAGNOSIS — Z79899 Other long term (current) drug therapy: Secondary | ICD-10-CM | POA: Diagnosis not present

## 2018-12-26 DIAGNOSIS — Z9221 Personal history of antineoplastic chemotherapy: Secondary | ICD-10-CM | POA: Diagnosis not present

## 2018-12-26 DIAGNOSIS — Z923 Personal history of irradiation: Secondary | ICD-10-CM | POA: Diagnosis not present

## 2018-12-26 DIAGNOSIS — C218 Malignant neoplasm of overlapping sites of rectum, anus and anal canal: Secondary | ICD-10-CM | POA: Diagnosis not present

## 2018-12-26 DIAGNOSIS — Z7982 Long term (current) use of aspirin: Secondary | ICD-10-CM | POA: Insufficient documentation

## 2018-12-26 DIAGNOSIS — Z85048 Personal history of other malignant neoplasm of rectum, rectosigmoid junction, and anus: Secondary | ICD-10-CM | POA: Diagnosis not present

## 2018-12-26 DIAGNOSIS — K219 Gastro-esophageal reflux disease without esophagitis: Secondary | ICD-10-CM | POA: Insufficient documentation

## 2018-12-26 DIAGNOSIS — C20 Malignant neoplasm of rectum: Secondary | ICD-10-CM | POA: Diagnosis not present

## 2018-12-26 DIAGNOSIS — D509 Iron deficiency anemia, unspecified: Secondary | ICD-10-CM | POA: Diagnosis not present

## 2018-12-26 DIAGNOSIS — K449 Diaphragmatic hernia without obstruction or gangrene: Secondary | ICD-10-CM | POA: Diagnosis not present

## 2018-12-26 DIAGNOSIS — Z85820 Personal history of malignant melanoma of skin: Secondary | ICD-10-CM | POA: Insufficient documentation

## 2018-12-26 LAB — COMPREHENSIVE METABOLIC PANEL
ALT: 17 U/L (ref 0–44)
AST: 25 U/L (ref 15–41)
Albumin: 3.9 g/dL (ref 3.5–5.0)
Alkaline Phosphatase: 75 U/L (ref 38–126)
Anion gap: 9 (ref 5–15)
BILIRUBIN TOTAL: 1.1 mg/dL (ref 0.3–1.2)
BUN: 14 mg/dL (ref 8–23)
CHLORIDE: 106 mmol/L (ref 98–111)
CO2: 24 mmol/L (ref 22–32)
Calcium: 9.5 mg/dL (ref 8.9–10.3)
Creatinine, Ser: 1.1 mg/dL (ref 0.61–1.24)
GFR calc Af Amer: >60 mL/min (ref >60)
Glucose, Bld: 103 mg/dL — ABNORMAL HIGH (ref 70–99)
POTASSIUM: 4.4 mmol/L (ref 3.5–5.1)
Sodium: 139 mmol/L (ref 135–145)
TOTAL PROTEIN: 6.7 g/dL (ref 6.5–8.1)

## 2018-12-26 LAB — CBC WITH DIFFERENTIAL/PLATELET
Abs Immature Granulocytes: 0.03 10*3/uL (ref 0.00–0.07)
BASOS PCT: 1 %
Basophils Absolute: 0 10*3/uL (ref 0.0–0.1)
EOS ABS: 0.1 10*3/uL (ref 0.0–0.5)
EOS PCT: 2 %
HEMATOCRIT: 37.3 % — AB (ref 39.0–52.0)
Hemoglobin: 13.1 g/dL (ref 13.0–17.0)
Immature Granulocytes: 1 %
LYMPHS ABS: 0.8 10*3/uL (ref 0.7–4.0)
Lymphocytes Relative: 19 %
MCH: 29.6 pg (ref 26.0–34.0)
MCHC: 35.1 g/dL (ref 30.0–36.0)
MCV: 84.4 fL (ref 80.0–100.0)
MONO ABS: 0.5 10*3/uL (ref 0.1–1.0)
MONOS PCT: 11 %
NEUTROS PCT: 66 %
Neutro Abs: 2.7 10*3/uL (ref 1.7–7.7)
PLATELETS: 157 10*3/uL (ref 150–400)
RBC: 4.42 MIL/uL (ref 4.22–5.81)
RDW: 13 % (ref 11.5–15.5)
WBC: 4.1 10*3/uL (ref 4.0–10.5)
nRBC: 0 % (ref 0.0–0.2)

## 2018-12-26 LAB — FERRITIN: FERRITIN: 36 ng/mL (ref 24–336)

## 2018-12-26 LAB — IRON AND TIBC
IRON: 109 ug/dL (ref 42–163)
SATURATION RATIOS: 37 % (ref 20–55)
TIBC: 295 ug/dL (ref 202–409)
UIBC: 186 ug/dL (ref 117–376)

## 2018-12-26 LAB — CEA (IN HOUSE-CHCC): CEA (CHCC-In House): 1.28 ng/mL (ref 0.00–5.00)

## 2018-12-26 MED ORDER — IOHEXOL 300 MG/ML  SOLN
100.0000 mL | Freq: Once | INTRAMUSCULAR | Status: AC | PRN
  Administered 2018-12-26: 100 mL via INTRAVENOUS

## 2018-12-26 MED ORDER — SODIUM CHLORIDE (PF) 0.9 % IJ SOLN
INTRAMUSCULAR | Status: AC
  Filled 2018-12-26: qty 50

## 2018-12-26 NOTE — Progress Notes (Signed)
[place]   [phone] Fax:(336) [phone]   Clinic Follow up Note   Patient Care Team: Maury Dus, MD as PCP - General (Family Medicine) Ladene Artist, MD as Consulting Physician (Gastroenterology) Leighton Ruff, MD as Consulting Physician (General Surgery) Truitt Merle, MD as Consulting Physician (Hematology) Kyung Rudd, MD as Consulting Physician (Radiation Oncology) Tania Ade, RN as Registered Nurse Michael Boston, MD as Consulting Physician (General Surgery) 12/30/2018  CHIEF COMPLAINT: F/u proximal rectal cancer   SUMMARY OF ONCOLOGIC HISTORY: Oncology History   Cancer Staging Rectal cancer ypT2ypN1cM0 s/p robotic LAR 08/04/2016 Staging form: Colon and Rectum, AJCC 7th Edition - Clinical stage from 03/19/2016: Stage IIIB (T3, N1, M0) - Signed by Truitt Merle, MD on 04/02/2016 - Pathologic stage from 08/04/2016: Stage IIIA (T2, N1b, cM0) - Signed by Truitt Merle, MD on 08/26/2016        Rectal cancer ypT2ypN1cM0 s/p robotic LAR 08/04/2016   03/19/2016 Initial Diagnosis    Rectal cancer ([place])    03/19/2016 Initial Biopsy    Rectal mass biopsy showed invasive adenocarcinoma. Sigmoid colon polyps showed tubular adenoma.     03/19/2016 Procedure    Colonoscopy showed a fungating partially obstructing mass in the proximal rectum, partially circumferential involving two thirds of the lumen circumference, measuring 6 x 4 cm, 10-16 cm from the anal verge. A 10 mm polyps was found in the sigmoid colon.    03/22/2016 Tumor Marker    CEA=1.1    03/23/2016 Imaging    CT chest, abdomen and pelvis with contrast showed asymmetric rectal mass approximately 10 cm from the anus, 2 suspicious lymph nodes in the deep pelvis concerning for local nodal metastasis. No other evidence of metastatic disease.    03/25/2016 Procedure    EUS showed a T3 proximal rectal mass, there was one small 6 mm suspicious for rectal node that was suspicious for malignancy (uN1)    04/14/2016 - 05/24/2016 Chemotherapy    Xeloda 1800 mg every 12 hours, Monday through Friday, with concurrent radiation    04/14/2016 - 05/24/2016 Radiation Therapy     Adjuvant irradiation to his rectal cancer.    08/04/2016 Surgery    Robotic-assisted low anterior resection of rectal cancer.    08/04/2016 Pathology Results    Rectosigmoid segmental resection showed adenocarcinoma of the rectum, grade 2, tumor invades muscularis propria, margins were negative. Metastatic adenocarcinoma was seen treatment effect in 2 lymph nodes, a total of 4 nodes.      09/09/2016 - 12/23/2016 Chemotherapy    Adjuvant chemo with CAPOX. He has hypersensitive reaction to oxaliplatin (difficulty breathing), and oxaliplatin was held after cycle 2, changed to Xeloda 1056m/m2 (25090min am and 200030mn pm), 2 weeks on and one week off, for additional 3 cycles      06/20/2017 Imaging    CT CAP W Contrast 06/20/17  IMPRESSION: 1. Post therapy related findings in the pelvis including anastomotic staple line in the rectum, along with perirectal and presacral density likely the result of prior radiation therapy. 2. Oval-shaped right inferior gluteal lymph node 1.8 by 1.3 cm, formerly 1.5 by 2.3 cm. 3. Mildly prominent in size appendix, somewhat low-density, appendiceal tip diameter 1.0 cm. Difficult to exclude appendiceal mucocele, surveillance versus appendectomy suggested. 4. New lucency along the right posterior inferior margin of the L4 vertebral body, probably a Schmorl's node given the contiguity with the disc. 5. Small to moderate-sized type 1 hiatal hernia. 6.  Aortic Atherosclerosis (ICD10-I70.0).  09/13/2017 Procedure    Colonoscopy 09/13/17 IMPRESSION:  - Patent end-to-end colo-colonic anastomosis, characterized by healthy appearing mucosa. - The examination was otherwise normal on direct views. - No specimens collected.    02/21/2018 Imaging    CT AP W Contrast 02/21/18 IMPRESSION: 1. Postoperative  findings in the lower rectum, with post therapy related findings in the region including stranding along the perirectal space margins and presacral stranding. The only potential finding of concern is a 1.3 cm in short axis right obturator lymph node, previously measured at 1.5 cm. No focal liver lesion. 2. Abnormal low-density and distention of the appendix with a 9 mm diameter. No periappendiceal stranding. Appearance concerning for appendiceal mucocele. Surgical consultation recommended. 3. Other imaging findings of potential clinical significance: Small type 1 hiatal hernia. Aortic Atherosclerosis (ICD10-I70.0). Dependent sludge or gallstones in the gallbladder.    12/26/2018 Imaging    CT CAP W Contrast IMPRESSION: 1. No evidence of recurrent or metastatic carcinoma within the chest, abdomen, or pelvis. 2. Small hiatal hernia.       CURRENT THERAPY  Surveillance  INTERVAL HISTORY: Jermaine Smith is a 68 y.o. male who is here for follow-up. Since last visit he had a CT CAP there was no evidence of recurrent or metastatic carcinoma. Today, he is here with his wife. He is doing very well. He takes antiacids, and monitors what he eats due to his GERD and small hernia in his stomach.  Pertinent positives and negatives of review of systems are listed and detailed within the above HPI.  REVIEW OF SYSTEMS:   Constitutional: Denies fevers, chills or abnormal weight loss Eyes: Denies blurriness of vision Ears, nose, mouth, throat, and face: Denies mucositis or sore throat Respiratory: Denies cough, dyspnea or wheezes Cardiovascular: Denies palpitation, chest discomfort or lower extremity swelling Gastrointestinal:  Denies nausea, heartburn or change in bowel habits Skin: Denies abnormal skin rashes Lymphatics: Denies new lymphadenopathy or easy bruising Neurological:Denies numbness, tingling or new weaknesses Behavioral/Psych: Mood is stable, no new changes  All other systems were  reviewed with the patient and are negative.  MEDICAL HISTORY:  Past Medical History:  Diagnosis Date  . FHx: brain aneurysm   . GERD (gastroesophageal reflux disease)   . Hiatal hernia   . Left inguinal hernia   . Rectal cancer ([place])   . Skin cancer    hx basal cell carcinoma 08/31/17 pt denies/AW    SURGICAL HISTORY: Past Surgical History:  Procedure Laterality Date  . CLAVICLE SURGERY    . EUS N/A 03/25/2016   Procedure: LOWER ENDOSCOPIC ULTRASOUND (EUS);  Surgeon: Milus Banister, MD;  Location: [facility];  Service: Endoscopy;  Laterality: N/A;  . INGUINAL HERNIA REPAIR  1961   open right inguinal age 89 y/o  . lap bilateral ing. hernia repair Bilateral 05/09/12  . XI ROBOTIC ASSISTED LOWER ANTERIOR RESECTION N/A 08/04/2016   Procedure: XI ROBOTIC ASSISTED LOWER ANTERIOR RESECTION WITH RIGID PROCTOSCOPY;  Surgeon: Leighton Ruff, MD;  Location: [facility];  Service: General;  Laterality: N/A;    I have reviewed the social history and family history with the patient and they are unchanged from previous note.  ALLERGIES:  has No Known Allergies.  MEDICATIONS:  Current Outpatient Medications  Medication Sig Dispense Refill  . aspirin 81 MG tablet Take 81 mg by mouth every morning.     . Cholecalciferol (VITAMIN D PO) Take 1 tablet by mouth daily.    Marland Kitchen dexlansoprazole (DEXILANT) 60 MG capsule Take 60 mg by  mouth every morning.     Marland Kitchen FIBER SELECT GUMMIES PO Take by mouth.    . Multiple Vitamin (MULTIVITAMIN) tablet Take 1 tablet by mouth every morning.      No current facility-administered medications for this visit.     PHYSICAL EXAMINATION: ECOG PERFORMANCE STATUS: 0 - Asymptomatic  Vitals:   12/28/18 1125  BP: (!) 171/99  Pulse: 62  Resp: 20  Temp: 97.7 F (36.5 C)  SpO2: 97%   Filed Weights   12/28/18 1125  Weight: 223 lb 9.6 oz (101.4 kg)    GENERAL:alert, no distress and comfortable SKIN: skin color, texture, turgor are normal, no rashes or significant  lesions EYES: normal, Conjunctiva are pink and non-injected, sclera clear OROPHARYNX:no exudate, no erythema and lips, buccal mucosa, and tongue normal  NECK: supple, thyroid normal size, non-tender, without nodularity LYMPH:  no palpable lymphadenopathy in the cervical, axillary or inguinal LUNGS: clear to auscultation and percussion with normal breathing effort HEART: regular rate & rhythm and no murmurs and no lower extremity edema ABDOMEN:abdomen soft, non-tender and normal bowel sounds Musculoskeletal:no cyanosis of digits and no clubbing  NEURO: alert & oriented x 3 with fluent speech, no focal motor/sensory deficits  LABORATORY DATA:  I have reviewed the data as listed CBC Latest Ref Rng & Units 12/26/2018 07/27/2018 02/21/2018  WBC 4.0 - 10.5 K/uL 4.1 4.1 4.4  Hemoglobin 13.0 - 17.0 g/dL 13.1 13.4 13.9  Hematocrit 39.0 - 52.0 % 37.3(L) 38.0(L) 39.8  Platelets 150 - 400 K/uL 157 141 154     CMP Latest Ref Rng & Units 12/26/2018 07/27/2018 02/21/2018  Glucose 70 - 99 mg/dL 103(H) 98 95  BUN 8 - 23 mg/dL _0 Creatinine 0.61 - 1.24 mg/dL 1.10 1.16 1.07  Sodium 135 - 145 mmol/L 139 139 137  Potassium 3.5 - 5.1 mmol/L 4.4 4.5 4.3  Chloride 98 - 111 mmol/L 106 105 106  CO2 22 - 32 mmol/L _1 Calcium 8.9 - 10.3 mg/dL 9.5 9.8 9.6  Total Protein 6.5 - 8.1 g/dL 6.7 7.1 7.2  Total Bilirubin 0.3 - 1.2 mg/dL 1.1 1.0 1.1  Alkaline Phos 38 - 126 U/L 75 82 94  AST 15 - 41 U/L _2 ALT 0 - 44 U/L _3 Tumor Marker: CEA 02/21/2018 <1.00 07/28/2019 1.47 12/26/2018 1.28   RADIOGRAPHIC STUDIES: I have personally reviewed the radiological images as listed and agreed with the findings in the report.  12/26/2018 CT CAP W Contrast IMPRESSION: 1. No evidence of recurrent or metastatic carcinoma within the chest, abdomen, or pelvis. 2. Small hiatal hernia.  ASSESSMENT & PLAN:  Jewell Ryans is a 68 y.o. male with history of   1. Proximal rectal adenocarcinoma, uT3N1M0,  stag IIIB, ypT2N1M0,  MMR normal - He was diagnosed 03/19/2016. He completed neoadjuvant chemoradiation with Xeloda 04/14/2016-05/24/2016, tolerated well overall. He is s/p robotic-assisted low anterior resection of rectal cancer.  -He started adjuvant chemo with CAPOX. Due to his moderate side effects from Oxaliplatin it was held after cycle 2, He continued Xeloda for additional 3 cycles and completed adjuvant chemo in 12/2016. - He is under surveillance. -He is clinically doing very well, has recovered well from treatment, back to full-time work.  Asymptomatic.  Exam was unremarkable.  Lab reviewed, CBC, CMP, CEA all within normal limits.  Restaging CT scan from December 26, 2018 reviewed personally, and I discussed with patient, no evidence of recurrence. -He is 2.5 years  out of initial diagnosis, his risk of recurrence has decreased.  We will continue surveillance for total of 5 years -Lab and follow-up in 6 months.  If he is clinically doing well, I do not plan to repeat surveillance CT scans.  2.  Enlarged appendix -His last few scan has showed slightly enlarged appendix with mucus inside, overall stable. This is likely benign, however appendiceal malignancy is not ruled out completely.  -Dr. Marcello Moores recommended continue monitoring, not to have appendectomy -His appendix appears to be normal on recent CT scan from December 26, 2018  Plan  -Continue cancer surveillance - F/u in 6 months with labs   No problem-specific Assessment & Plan notes found for this encounter.   No orders of the defined types were placed in this encounter.  All questions were answered. The patient knows to call the clinic with any problems, questions or concerns. No barriers to learning was detected. I spent 20 minutes counseling the patient face to face. The total time spent in the appointment was 25 minutes and more than 50% was on counseling and review of test results  I, Manson Allan am acting as scribe for Dr.  Truitt Merle.  I have reviewed the above documentation for accuracy and completeness, and I agree with the above.     Truitt Merle, MD 12/28/2018

## 2018-12-28 ENCOUNTER — Encounter: Payer: Self-pay | Admitting: Hematology

## 2018-12-28 ENCOUNTER — Inpatient Hospital Stay (HOSPITAL_BASED_OUTPATIENT_CLINIC_OR_DEPARTMENT_OTHER): Payer: Medicare Other | Admitting: Hematology

## 2018-12-28 ENCOUNTER — Telehealth: Payer: Self-pay

## 2018-12-28 VITALS — BP 171/99 | HR 62 | Temp 97.7°F | Resp 20 | Ht 76.0 in | Wt 223.6 lb

## 2018-12-28 DIAGNOSIS — Z9221 Personal history of antineoplastic chemotherapy: Secondary | ICD-10-CM

## 2018-12-28 DIAGNOSIS — K219 Gastro-esophageal reflux disease without esophagitis: Secondary | ICD-10-CM

## 2018-12-28 DIAGNOSIS — Z923 Personal history of irradiation: Secondary | ICD-10-CM | POA: Diagnosis not present

## 2018-12-28 DIAGNOSIS — C20 Malignant neoplasm of rectum: Secondary | ICD-10-CM

## 2018-12-28 DIAGNOSIS — Z7982 Long term (current) use of aspirin: Secondary | ICD-10-CM

## 2018-12-28 DIAGNOSIS — Z85820 Personal history of malignant melanoma of skin: Secondary | ICD-10-CM

## 2018-12-28 DIAGNOSIS — Z85048 Personal history of other malignant neoplasm of rectum, rectosigmoid junction, and anus: Secondary | ICD-10-CM

## 2018-12-28 DIAGNOSIS — K449 Diaphragmatic hernia without obstruction or gangrene: Secondary | ICD-10-CM | POA: Diagnosis not present

## 2018-12-28 DIAGNOSIS — D509 Iron deficiency anemia, unspecified: Secondary | ICD-10-CM | POA: Diagnosis not present

## 2018-12-28 DIAGNOSIS — Z79899 Other long term (current) drug therapy: Secondary | ICD-10-CM

## 2018-12-28 NOTE — Telephone Encounter (Signed)
Printed avs and calender of upcoming appointment. Per 2/13 los 

## 2018-12-30 ENCOUNTER — Encounter: Payer: Self-pay | Admitting: Hematology

## 2019-06-25 NOTE — Progress Notes (Signed)
[name]   Telephone:(336) [phone] Fax:(336) [phone]   Clinic Follow up Note   Patient Care Team: Maury Dus, MD as PCP - General (Family Medicine) Ladene Artist, MD as Consulting Physician (Gastroenterology) Leighton Ruff, MD as Consulting Physician (General Surgery) Truitt Merle, MD as Consulting Physician (Hematology) Kyung Rudd, MD as Consulting Physician (Radiation Oncology) Tania Ade, RN as Registered Nurse Michael Boston, MD as Consulting Physician (General Surgery)  Date of Service:  06/28/2019  CHIEF COMPLAINT: F/u of Proximal rectal cancer   SUMMARY OF ONCOLOGIC HISTORY: Oncology History Overview Note  Cancer Staging Rectal cancer ypT2ypN1cM0 s/p robotic LAR 08/04/2016 Staging form: Colon and Rectum, AJCC 7th Edition - Clinical stage from 03/19/2016: Stage IIIB (T3, N1, M0) - Signed by Truitt Merle, MD on 04/02/2016 - Pathologic stage from 08/04/2016: Stage IIIA (T2, N1b, cM0) - Signed by Truitt Merle, MD on 08/26/2016      Rectal cancer ypT2ypN1cM0 s/p robotic LAR 08/04/2016  03/19/2016 Initial Diagnosis   Rectal cancer ([place])   03/19/2016 Initial Biopsy   Rectal mass biopsy showed invasive adenocarcinoma. Sigmoid colon polyps showed tubular adenoma.    03/19/2016 Procedure   Colonoscopy showed a fungating partially obstructing mass in the proximal rectum, partially circumferential involving two thirds of the lumen circumference, measuring 6 x 4 cm, 10-16 cm from the anal verge. A 10 mm polyps was found in the sigmoid colon.   03/22/2016 Tumor Marker   CEA=1.1   03/23/2016 Imaging   CT chest, abdomen and pelvis with contrast showed asymmetric rectal mass approximately 10 cm from the anus, 2 suspicious lymph nodes in the deep pelvis concerning for local nodal metastasis. No other evidence of metastatic disease.   03/25/2016 Procedure   EUS showed a T3 proximal rectal mass, there was one small 6 mm suspicious for rectal node that was suspicious for malignancy  (uN1)   04/14/2016 - 05/24/2016 Chemotherapy   Xeloda 1800 mg every 12 hours, Monday through Friday, with concurrent radiation   04/14/2016 - 05/24/2016 Radiation Therapy    Adjuvant irradiation to his rectal cancer.   08/04/2016 Surgery   Robotic-assisted low anterior resection of rectal cancer.   08/04/2016 Pathology Results   Rectosigmoid segmental resection showed adenocarcinoma of the rectum, grade 2, tumor invades muscularis propria, margins were negative. Metastatic adenocarcinoma was seen treatment effect in 2 lymph nodes, a total of 4 nodes.     09/09/2016 - 12/23/2016 Chemotherapy   Adjuvant chemo with CAPOX. He has hypersensitive reaction to oxaliplatin (difficulty breathing), and oxaliplatin was held after cycle 2, changed to Xeloda 1037m/m2 (25032min am and 200029mn pm), 2 weeks on and one week off, for additional 3 cycles     06/20/2017 Imaging   CT CAP W Contrast 06/20/17  IMPRESSION: 1. Post therapy related findings in the pelvis including anastomotic staple line in the rectum, along with perirectal and presacral density likely the result of prior radiation therapy. 2. Oval-shaped right inferior gluteal lymph node 1.8 by 1.3 cm, formerly 1.5 by 2.3 cm. 3. Mildly prominent in size appendix, somewhat low-density, appendiceal tip diameter 1.0 cm. Difficult to exclude appendiceal mucocele, surveillance versus appendectomy suggested. 4. New lucency along the right posterior inferior margin of the L4 vertebral body, probably a Schmorl's node given the contiguity with the disc. 5. Small to moderate-sized type 1 hiatal hernia. 6.  Aortic Atherosclerosis (ICD10-I70.0).   09/13/2017 Procedure   Colonoscopy 09/13/17 IMPRESSION:  - Patent end-to-end colo-colonic anastomosis, characterized by healthy appearing mucosa. -  The examination was otherwise normal on direct views. - No specimens collected.   02/21/2018 Imaging   CT AP W Contrast 02/21/18 IMPRESSION: 1. Postoperative  findings in the lower rectum, with post therapy related findings in the region including stranding along the perirectal space margins and presacral stranding. The only potential finding of concern is a 1.3 cm in short axis right obturator lymph node, previously measured at 1.5 cm. No focal liver lesion. 2. Abnormal low-density and distention of the appendix with a 9 mm diameter. No periappendiceal stranding. Appearance concerning for appendiceal mucocele. Surgical consultation recommended. 3. Other imaging findings of potential clinical significance: Small type 1 hiatal hernia. Aortic Atherosclerosis (ICD10-I70.0). Dependent sludge or gallstones in the gallbladder.   12/26/2018 Imaging   CT CAP W Contrast IMPRESSION: 1. No evidence of recurrent or metastatic carcinoma within the chest, abdomen, or pelvis. 2. Small hiatal hernia.        CURRENT THERAPY:  Surveillance   INTERVAL HISTORY:  Jermaine Smith is here for a follow up proximal rectal cancer. He was last seen by me 6 months ago. She presents to the clinic alone. He notes he is dong well. His energy is not back at original baseline but still adequate for him to work full time. He notes with last CT scan he felt the same anxious reaction as he did with his infusion reaction from chemo. He was trying got catch his breath on the scan.     REVIEW OF SYSTEMS:   Constitutional: Denies fevers, chills or abnormal weight loss Eyes: Denies blurriness of vision Ears, nose, mouth, throat, and face: Denies mucositis or sore throat Respiratory: Denies cough, dyspnea or wheezes Cardiovascular: Denies palpitation, chest discomfort or lower extremity swelling Gastrointestinal:  Denies nausea, heartburn or change in bowel habits Skin: Denies abnormal skin rashes Lymphatics: Denies new lymphadenopathy or easy bruising Neurological:Denies numbness, tingling or new weaknesses Behavioral/Psych: Mood is stable, no new changes  All other systems  were reviewed with the patient and are negative.  MEDICAL HISTORY:  Past Medical History:  Diagnosis Date   FHx: brain aneurysm    GERD (gastroesophageal reflux disease)    Hiatal hernia    Left inguinal hernia    Rectal cancer ([facility])    Skin cancer    hx basal cell carcinoma 08/31/17 pt denies/AW    SURGICAL HISTORY: Past Surgical History:  Procedure Laterality Date   CLAVICLE SURGERY     EUS N/A 03/25/2016   Procedure: LOWER ENDOSCOPIC ULTRASOUND (EUS);  Surgeon: Milus Banister, MD;  Location: [facility];  Service: Endoscopy;  Laterality: N/A;   INGUINAL HERNIA REPAIR  1961   open right inguinal age 16 y/o   lap bilateral ing. hernia repair Bilateral 05/09/12   XI ROBOTIC ASSISTED LOWER ANTERIOR RESECTION N/A 08/04/2016   Procedure: XI ROBOTIC ASSISTED LOWER ANTERIOR RESECTION WITH RIGID PROCTOSCOPY;  Surgeon: Leighton Ruff, MD;  Location: [facility];  Service: General;  Laterality: N/A;    I have reviewed the social history and family history with the patient and they are unchanged from previous note.  ALLERGIES:  has No Known Allergies.  MEDICATIONS:  Current Outpatient Medications  Medication Sig Dispense Refill   aspirin 81 MG tablet Take 81 mg by mouth every morning.      Cholecalciferol (VITAMIN D PO) Take 1 tablet by mouth daily.     dexlansoprazole (DEXILANT) 60 MG capsule Take 60 mg by mouth every morning.      FIBER SELECT GUMMIES PO Take by  mouth.     Multiple Vitamin (MULTIVITAMIN) tablet Take 1 tablet by mouth every morning.      No current facility-administered medications for this visit.     PHYSICAL EXAMINATION: ECOG PERFORMANCE STATUS: 0 - Asymptomatic  Vitals:   06/28/19 1059  BP: 123/84  Pulse: 61  Resp: 18  Temp: 98.5 F (36.9 C)  SpO2: 98%   Filed Weights   06/28/19 1059  Weight: 221 lb (100.2 kg)    GENERAL:alert, no distress and comfortable SKIN: skin color, texture, turgor are normal (+) Mild skin rash of left upper  posterior thigh EYES: normal, Conjunctiva are pink and non-injected, sclera clear  NECK: supple, thyroid normal size, non-tender, without nodularity LYMPH:  no palpable lymphadenopathy in the cervical, axillary or inguinal LUNGS: clear to auscultation and percussion with normal breathing effort HEART: regular rate & rhythm and no murmurs and no lower extremity edema ABDOMEN:abdomen soft, non-tender and normal bowel sounds Musculoskeletal:no cyanosis of digits and no clubbing  NEURO: alert & oriented x 3 with fluent speech, no focal motor/sensory deficits RECTAL: No palpable mass. No blood on glove. Normal stool present. Benign exam   LABORATORY DATA:  I have reviewed the data as listed CBC Latest Ref Rng & Units 06/28/2019 12/26/2018 07/27/2018  WBC 4.0 - 10.5 K/uL 4.9 4.1 4.1  Hemoglobin 13.0 - 17.0 g/dL 13.4 13.1 13.4  Hematocrit 39.0 - 52.0 % 38.2(L) 37.3(L) 38.0(L)  Platelets 150 - 400 K/uL 167 157 141     CMP Latest Ref Rng & Units 06/28/2019 12/26/2018 07/27/2018  Glucose 70 - 99 mg/dL 115(H) 103(H) 98  BUN 8 - 23 mg/dL 16 14 18   Creatinine 0.61 - 1.24 mg/dL 1.16 1.10 1.16  Sodium 135 - 145 mmol/L 136 139 139  Potassium 3.5 - 5.1 mmol/L 4.0 4.4 4.5  Chloride 98 - 111 mmol/L 105 106 105  CO2 22 - 32 mmol/L 23 24 26   Calcium 8.9 - 10.3 mg/dL 9.4 9.5 9.8  Total Protein 6.5 - 8.1 g/dL 6.9 6.7 7.1  Total Bilirubin 0.3 - 1.2 mg/dL 1.0 1.1 1.0  Alkaline Phos 38 - 126 U/L 87 75 82  AST 15 - 41 U/L 15 25 19   ALT 0 - 44 U/L 13 17 18       RADIOGRAPHIC STUDIES: I have personally reviewed the radiological images as listed and agreed with the findings in the report. No results found.   ASSESSMENT & PLAN:  Jermaine Smith is a 68 y.o. male with   1. Proximal rectal adenocarcinoma, uT3N1M0, stag IIIB, ypT2N1M0, MMR normal - He was diagnosed 03/19/2016. He completed neoadjuvant chemoradiation with Xeloda 04/14/2016-05/24/2016, tolerated well overall. He is s/p robotic-assisted low anterior  resection of rectal cancer.  -He started adjuvant chemo with CAPOX. Due to his moderate side effects from Oxaliplatin it was held after cycle 2, He continued Xeloda for additional 3 cycles and completed adjuvant chemo in 12/2016. -He is currently under surveillance. -His last colonoscopy was 08/2017. Dr. Fuller Plan recommended he repeat 3 years later. He will be due next year.  -He is clinically doing well with stable energy. Labs reviewed, CBC and CMP WNL except BG 115. CEA is still pending. Physical exam unremarkable today.  -He is over 3 years since his diagnosis. His risk of recurrence has significantly dropped. I do not plan to scan him again unless he develops concerning symptoms. Will continue close surveillance for 5 years total.  -F/u in 6 months then yearly    2. Enlarged appendix, resolved.  -  His last few scan has showed slightly enlarged appendix with mucus inside, overall stable. This is likely benign, however appendiceal malignancy is not ruled out completely.  -Dr. Marcello Moores recommended continue monitoring, not to have appendectomy -His appendix appears to be normal on recent CT scan from December 26, 2018   Plan -He is clinically doing well. Continue cancer surveillance - F/u in 6 months with labs   No problem-specific Assessment & Plan notes found for this encounter.   No orders of the defined types were placed in this encounter.  All questions were answered. The patient knows to call the clinic with any problems, questions or concerns. No barriers to learning was detected. I spent 15 minutes counseling the patient face to face. The total time spent in the appointment was 20 minutes and more than 50% was on counseling and review of test results     Truitt Merle, MD 06/28/2019   I, Joslyn Devon, am acting as scribe for Truitt Merle, MD.   I have reviewed the above documentation for accuracy and completeness, and I agree with the above.

## 2019-06-28 ENCOUNTER — Inpatient Hospital Stay: Payer: Medicare Other | Attending: Hematology | Admitting: Hematology

## 2019-06-28 ENCOUNTER — Other Ambulatory Visit: Payer: Self-pay

## 2019-06-28 ENCOUNTER — Encounter: Payer: Self-pay | Admitting: Hematology

## 2019-06-28 ENCOUNTER — Telehealth: Payer: Self-pay | Admitting: Hematology

## 2019-06-28 ENCOUNTER — Inpatient Hospital Stay: Payer: Medicare Other

## 2019-06-28 VITALS — BP 123/84 | HR 61 | Temp 98.5°F | Resp 18 | Ht 76.0 in | Wt 221.0 lb

## 2019-06-28 DIAGNOSIS — Z9221 Personal history of antineoplastic chemotherapy: Secondary | ICD-10-CM | POA: Diagnosis not present

## 2019-06-28 DIAGNOSIS — D125 Benign neoplasm of sigmoid colon: Secondary | ICD-10-CM | POA: Diagnosis not present

## 2019-06-28 DIAGNOSIS — K449 Diaphragmatic hernia without obstruction or gangrene: Secondary | ICD-10-CM | POA: Insufficient documentation

## 2019-06-28 DIAGNOSIS — Z85828 Personal history of other malignant neoplasm of skin: Secondary | ICD-10-CM | POA: Insufficient documentation

## 2019-06-28 DIAGNOSIS — K8020 Calculus of gallbladder without cholecystitis without obstruction: Secondary | ICD-10-CM | POA: Diagnosis not present

## 2019-06-28 DIAGNOSIS — C20 Malignant neoplasm of rectum: Secondary | ICD-10-CM | POA: Diagnosis not present

## 2019-06-28 DIAGNOSIS — I700 Atherosclerosis of aorta: Secondary | ICD-10-CM | POA: Diagnosis not present

## 2019-06-28 DIAGNOSIS — Z923 Personal history of irradiation: Secondary | ICD-10-CM | POA: Insufficient documentation

## 2019-06-28 DIAGNOSIS — Z79899 Other long term (current) drug therapy: Secondary | ICD-10-CM | POA: Insufficient documentation

## 2019-06-28 LAB — CBC WITH DIFFERENTIAL/PLATELET
Abs Immature Granulocytes: 0.02 10*3/uL (ref 0.00–0.07)
Basophils Absolute: 0 10*3/uL (ref 0.0–0.1)
Basophils Relative: 0 %
Eosinophils Absolute: 0.1 10*3/uL (ref 0.0–0.5)
Eosinophils Relative: 2 %
HCT: 38.2 % — ABNORMAL LOW (ref 39.0–52.0)
Hemoglobin: 13.4 g/dL (ref 13.0–17.0)
Immature Granulocytes: 0 %
Lymphocytes Relative: 17 %
Lymphs Abs: 0.8 10*3/uL (ref 0.7–4.0)
MCH: 29.6 pg (ref 26.0–34.0)
MCHC: 35.1 g/dL (ref 30.0–36.0)
MCV: 84.5 fL (ref 80.0–100.0)
Monocytes Absolute: 0.6 10*3/uL (ref 0.1–1.0)
Monocytes Relative: 11 %
Neutro Abs: 3.4 10*3/uL (ref 1.7–7.7)
Neutrophils Relative %: 70 %
Platelets: 167 10*3/uL (ref 150–400)
RBC: 4.52 MIL/uL (ref 4.22–5.81)
RDW: 12.7 % (ref 11.5–15.5)
WBC: 4.9 10*3/uL (ref 4.0–10.5)
nRBC: 0 % (ref 0.0–0.2)

## 2019-06-28 LAB — COMPREHENSIVE METABOLIC PANEL
ALT: 13 U/L (ref 0–44)
AST: 15 U/L (ref 15–41)
Albumin: 3.9 g/dL (ref 3.5–5.0)
Alkaline Phosphatase: 87 U/L (ref 38–126)
Anion gap: 8 (ref 5–15)
BUN: 16 mg/dL (ref 8–23)
CO2: 23 mmol/L (ref 22–32)
Calcium: 9.4 mg/dL (ref 8.9–10.3)
Chloride: 105 mmol/L (ref 98–111)
Creatinine, Ser: 1.16 mg/dL (ref 0.61–1.24)
GFR calc Af Amer: >60 mL/min (ref >60)
GFR calc non Af Amer: >60 mL/min (ref >60)
Glucose, Bld: 115 mg/dL — ABNORMAL HIGH (ref 70–99)
Potassium: 4 mmol/L (ref 3.5–5.1)
Sodium: 136 mmol/L (ref 135–145)
Total Bilirubin: 1 mg/dL (ref 0.3–1.2)
Total Protein: 6.9 g/dL (ref 6.5–8.1)

## 2019-06-28 LAB — CEA (IN HOUSE-CHCC): CEA (CHCC-In House): 1.45 ng/mL (ref 0.00–5.00)

## 2019-06-28 NOTE — Telephone Encounter (Signed)
Scheduled appt per 8/13 los - pt aware - per patient no print out needed. Pt will check my chart.

## 2019-06-29 ENCOUNTER — Encounter: Payer: Self-pay | Admitting: Hematology

## 2019-07-17 DIAGNOSIS — Z23 Encounter for immunization: Secondary | ICD-10-CM | POA: Diagnosis not present

## 2019-12-24 NOTE — Progress Notes (Signed)
[place]   [phone] [phone]   Clinic Follow up Note   Patient Care Team: Maury Dus, MD as PCP - General (Family Medicine) Ladene Artist, MD as Consulting Physician (Gastroenterology) Leighton Ruff, MD as Consulting Physician (General Surgery) Truitt Merle, MD as Consulting Physician (Hematology) Kyung Rudd, MD as Consulting Physician (Radiation Oncology) Tania Ade, RN as Registered Nurse Michael Boston, MD as Consulting Physician (General Surgery)  Date of Service:  12/28/2019  CHIEF COMPLAINT: F/u of Proximal rectal cancer   SUMMARY OF ONCOLOGIC HISTORY: Oncology History Overview Note  Cancer Staging Rectal cancer ypT2ypN1cM0 s/p robotic LAR 08/04/2016 Staging form: Colon and Rectum, AJCC 7th Edition - Clinical stage from 03/19/2016: Stage IIIB (T3, N1, M0) - Signed by Truitt Merle, MD on 04/02/2016 - Pathologic stage from 08/04/2016: Stage IIIA (T2, N1b, cM0) - Signed by Truitt Merle, MD on 08/26/2016      Rectal cancer ypT2ypN1cM0 s/p robotic LAR 08/04/2016  03/19/2016 Initial Diagnosis   Rectal cancer ([place])   03/19/2016 Initial Biopsy   Rectal mass biopsy showed invasive adenocarcinoma. Sigmoid colon polyps showed tubular adenoma.    03/19/2016 Procedure   Colonoscopy showed a fungating partially obstructing mass in the proximal rectum, partially circumferential involving two thirds of the lumen circumference, measuring 6 x 4 cm, 10-16 cm from the anal verge. A 10 mm polyps was found in the sigmoid colon.   03/22/2016 Tumor Marker   CEA=1.1   03/23/2016 Imaging   CT chest, abdomen and pelvis with contrast showed asymmetric rectal mass approximately 10 cm from the anus, 2 suspicious lymph nodes in the deep pelvis concerning for local nodal metastasis. No other evidence of metastatic disease.   03/25/2016 Procedure   EUS showed a T3 proximal rectal mass, there was one small 6 mm suspicious for rectal node that was suspicious for malignancy  (uN1)   04/14/2016 - 05/24/2016 Chemotherapy   Xeloda 1800 mg every 12 hours, Monday through Friday, with concurrent radiation   04/14/2016 - 05/24/2016 Radiation Therapy    Adjuvant irradiation to his rectal cancer.   08/04/2016 Surgery   Robotic-assisted low anterior resection of rectal cancer.   08/04/2016 Pathology Results   Rectosigmoid segmental resection showed adenocarcinoma of the rectum, grade 2, tumor invades muscularis propria, margins were negative. Metastatic adenocarcinoma was seen treatment effect in 2 lymph nodes, a total of 4 nodes.     09/09/2016 - 12/23/2016 Chemotherapy   Adjuvant chemo with CAPOX. He has hypersensitive reaction to oxaliplatin (difficulty breathing), and oxaliplatin was held after cycle 2, changed to Xeloda 1036m/m2 (25075min am and 200050mn pm), 2 weeks on and one week off, for additional 3 cycles     06/20/2017 Imaging   CT CAP W Contrast 06/20/17  IMPRESSION: 1. Post therapy related findings in the pelvis including anastomotic staple line in the rectum, along with perirectal and presacral density likely the result of prior radiation therapy. 2. Oval-shaped right inferior gluteal lymph node 1.8 by 1.3 cm, formerly 1.5 by 2.3 cm. 3. Mildly prominent in size appendix, somewhat low-density, appendiceal tip diameter 1.0 cm. Difficult to exclude appendiceal mucocele, surveillance versus appendectomy suggested. 4. New lucency along the right posterior inferior margin of the L4 vertebral body, probably a Schmorl's node given the contiguity with the disc. 5. Small to moderate-sized type 1 hiatal hernia. 6.  Aortic Atherosclerosis (ICD10-I70.0).   09/13/2017 Procedure   Colonoscopy 09/13/17 IMPRESSION:  - Patent end-to-end colo-colonic anastomosis, characterized by healthy appearing mucosa. -  The examination was otherwise normal on direct views. - No specimens collected.   02/21/2018 Imaging   CT AP W Contrast 02/21/18 IMPRESSION: 1. Postoperative  findings in the lower rectum, with post therapy related findings in the region including stranding along the perirectal space margins and presacral stranding. The only potential finding of concern is a 1.3 cm in short axis right obturator lymph node, previously measured at 1.5 cm. No focal liver lesion. 2. Abnormal low-density and distention of the appendix with a 9 mm diameter. No periappendiceal stranding. Appearance concerning for appendiceal mucocele. Surgical consultation recommended. 3. Other imaging findings of potential clinical significance: Small type 1 hiatal hernia. Aortic Atherosclerosis (ICD10-I70.0). Dependent sludge or gallstones in the gallbladder.   12/26/2018 Imaging   CT CAP W Contrast IMPRESSION: 1. No evidence of recurrent or metastatic carcinoma within the chest, abdomen, or pelvis. 2. Small hiatal hernia.        CURRENT THERAPY:  Surveillance   INTERVAL HISTORY:  Jermaine Smith is here for a follow up rectal cancer. He was last seen by me 6 months ago. He presents to the clinic with his wife.  They are doing very well, has good appetite, energy level, weight has been stable.  He denies significant constipation or diarrhea, no abdominal discomfort or other symptoms.  Review of systems otherwise negative.   MEDICAL HISTORY:  Past Medical History:  Diagnosis Date  . FHx: brain aneurysm   . GERD (gastroesophageal reflux disease)   . Hiatal hernia   . Left inguinal hernia   . Rectal cancer ([place])   . Skin cancer    hx basal cell carcinoma 08/31/17 pt denies/69    SURGICAL HISTORY: Past Surgical History:  Procedure Laterality Date  . CLAVICLE SURGERY    . EUS N/A 03/25/2016   Procedure: LOWER ENDOSCOPIC ULTRASOUND (EUS);  Surgeon: Milus Banister, MD;  Location: [facility];  Service: Endoscopy;  Laterality: N/A;  . INGUINAL HERNIA REPAIR  1961   open right inguinal age 69 y/o  . lap bilateral ing. hernia repair Bilateral 05/09/12  . XI ROBOTIC ASSISTED  LOWER ANTERIOR RESECTION N/A 08/04/2016   Procedure: XI ROBOTIC ASSISTED LOWER ANTERIOR RESECTION WITH RIGID PROCTOSCOPY;  Surgeon: Leighton Ruff, MD;  Location: [facility];  Service: General;  Laterality: N/A;    I have reviewed the social history and family history with the patient and they are unchanged from previous note.  ALLERGIES:  has No Known Allergies.  MEDICATIONS:  Current Outpatient Medications  Medication Sig Dispense Refill  . aspirin 81 MG tablet Take 81 mg by mouth every morning.     . Cholecalciferol (VITAMIN D PO) Take 1 tablet by mouth daily.    Marland Kitchen dexlansoprazole (DEXILANT) 60 MG capsule Take 60 mg by mouth every morning.     Marland Kitchen FIBER SELECT GUMMIES PO Take by mouth.    . Multiple Vitamin (MULTIVITAMIN) tablet Take 1 tablet by mouth every morning.      No current facility-administered medications for this visit.    PHYSICAL EXAMINATION: ECOG PERFORMANCE STATUS: 0 - Asymptomatic  Vitals:   12/28/19 1043  BP: 133/78  Pulse: 66  Resp: 18  Temp: 98.3 F (36.8 C)  SpO2: 100%   Filed Weights   12/28/19 1042  Weight: 219 lb 9.6 oz (99.6 kg)    GENERAL:alert, no distress and comfortable SKIN: skin color, texture, turgor are normal, no rashes or significant lesions EYES: normal, Conjunctiva are pink and non-injected, sclera clear NECK: supple, thyroid normal size,  non-tender, without nodularity LYMPH:  no palpable lymphadenopathy in the cervical, axillary  LUNGS: clear to auscultation and percussion with normal breathing effort HEART: regular rate & rhythm and no murmurs and no lower extremity edema ABDOMEN:abdomen soft, non-tender and normal bowel sounds Musculoskeletal:no cyanosis of digits and no clubbing  NEURO: alert & oriented x 3 with fluent speech, no focal motor/sensory deficits  LABORATORY DATA:  I have reviewed the data as listed CBC Latest Ref Rng & Units 12/28/2019 06/28/2019 12/26/2018  WBC 4.0 - 10.5 K/uL 4.3 4.9 4.1  Hemoglobin 13.0 - 17.0 g/dL  13.3 13.4 13.1  Hematocrit 39.0 - 52.0 % 37.4(L) 38.2(L) 37.3(L)  Platelets 150 - 400 K/uL 158 167 157     CMP Latest Ref Rng & Units 12/28/2019 06/28/2019 12/26/2018  Glucose 70 - 99 mg/dL 120(H) 115(H) 103(H)  BUN 8 - 23 mg/dL 16 16 14   Creatinine 0.61 - 1.24 mg/dL 1.02 1.16 1.10  Sodium 135 - 145 mmol/L 139 136 139  Potassium 3.5 - 5.1 mmol/L 4.1 4.0 4.4  Chloride 98 - 111 mmol/L 108 105 106  CO2 22 - 32 mmol/L 24 23 24   Calcium 8.9 - 10.3 mg/dL 8.9 9.4 9.5  Total Protein 6.5 - 8.1 g/dL 6.6 6.9 6.7  Total Bilirubin 0.3 - 1.2 mg/dL 1.0 1.0 1.1  Alkaline Phos 38 - 126 U/L 68 87 75  AST 15 - 41 U/L 16 15 25   ALT 0 - 44 U/L 13 13 17       RADIOGRAPHIC STUDIES: I have personally reviewed the radiological images as listed and agreed with the findings in the report. No results found.   ASSESSMENT & PLAN:  Cayleb Jarnigan is a 69 y.o. male with    1. Proximal rectal adenocarcinoma, uT3N1M0, stag IIIB, ypT2N1M0, MMR normal -He was diagnosed 03/19/2016. Hecompleted neoadjuvant chemoradiation with Xeloda5/31/2017-05/24/2016, tolerated well overall.He is s/p robotic-assisted low anterior resection of rectal cancer. -He started adjuvant chemo withCAPOX.Due to his moderate side effects from Oxaliplatinit was heldafter cycle 2, He continuedXeloda for additional 3 cycles and completed adjuvant chemo in 12/2016. -He is currently under surveillance. -His last colonoscopy was 08/2017. Dr. Fuller Plan recommended he repeat 3 years later. He will be due in 08/2020.  -He is clinically doing very well, asymptomatic, exam was unremarkable today.  Lab reviewed, CBC and CMP are within normal CEA still pending. -lab and f/u in one year    Plan -He is clinically doing well. Continue cancer surveillance - F/u in92month with labs    No problem-specific Assessment & Plan notes found for this encounter.   No orders of the defined types were placed in this encounter.  All questions were answered.  The patient knows to call the clinic with any problems, questions or concerns. No barriers to learning was detected. The total time spent in the appointment was 20 minutes.     YTruitt Merle MD 12/28/2019   I, AJoslyn Devon am acting as scribe for YTruitt Merle MD.   I have reviewed the above documentation for accuracy and completeness, and I agree with the above.

## 2019-12-28 ENCOUNTER — Inpatient Hospital Stay: Payer: Medicare Other

## 2019-12-28 ENCOUNTER — Telehealth: Payer: Self-pay | Admitting: Hematology

## 2019-12-28 ENCOUNTER — Inpatient Hospital Stay: Payer: Medicare Other | Attending: Hematology | Admitting: Hematology

## 2019-12-28 ENCOUNTER — Other Ambulatory Visit: Payer: Self-pay

## 2019-12-28 ENCOUNTER — Encounter: Payer: Self-pay | Admitting: Hematology

## 2019-12-28 VITALS — BP 133/78 | HR 66 | Temp 98.3°F | Resp 18 | Ht 76.0 in | Wt 219.6 lb

## 2019-12-28 DIAGNOSIS — K449 Diaphragmatic hernia without obstruction or gangrene: Secondary | ICD-10-CM | POA: Diagnosis not present

## 2019-12-28 DIAGNOSIS — C20 Malignant neoplasm of rectum: Secondary | ICD-10-CM | POA: Insufficient documentation

## 2019-12-28 DIAGNOSIS — Z79899 Other long term (current) drug therapy: Secondary | ICD-10-CM | POA: Insufficient documentation

## 2019-12-28 DIAGNOSIS — Z923 Personal history of irradiation: Secondary | ICD-10-CM | POA: Insufficient documentation

## 2019-12-28 DIAGNOSIS — I700 Atherosclerosis of aorta: Secondary | ICD-10-CM | POA: Diagnosis not present

## 2019-12-28 DIAGNOSIS — Z9221 Personal history of antineoplastic chemotherapy: Secondary | ICD-10-CM | POA: Insufficient documentation

## 2019-12-28 LAB — CBC WITH DIFFERENTIAL/PLATELET
Abs Immature Granulocytes: 0.03 10*3/uL (ref 0.00–0.07)
Basophils Absolute: 0 10*3/uL (ref 0.0–0.1)
Basophils Relative: 1 %
Eosinophils Absolute: 0.1 10*3/uL (ref 0.0–0.5)
Eosinophils Relative: 3 %
HCT: 37.4 % — ABNORMAL LOW (ref 39.0–52.0)
Hemoglobin: 13.3 g/dL (ref 13.0–17.0)
Immature Granulocytes: 1 %
Lymphocytes Relative: 19 %
Lymphs Abs: 0.8 10*3/uL (ref 0.7–4.0)
MCH: 29.8 pg (ref 26.0–34.0)
MCHC: 35.6 g/dL (ref 30.0–36.0)
MCV: 83.9 fL (ref 80.0–100.0)
Monocytes Absolute: 0.5 10*3/uL (ref 0.1–1.0)
Monocytes Relative: 10 %
Neutro Abs: 2.9 10*3/uL (ref 1.7–7.7)
Neutrophils Relative %: 66 %
Platelets: 158 10*3/uL (ref 150–400)
RBC: 4.46 MIL/uL (ref 4.22–5.81)
RDW: 12.7 % (ref 11.5–15.5)
WBC: 4.3 10*3/uL (ref 4.0–10.5)
nRBC: 0 % (ref 0.0–0.2)

## 2019-12-28 LAB — COMPREHENSIVE METABOLIC PANEL
ALT: 13 U/L (ref 0–44)
AST: 16 U/L (ref 15–41)
Albumin: 4 g/dL (ref 3.5–5.0)
Alkaline Phosphatase: 68 U/L (ref 38–126)
Anion gap: 7 (ref 5–15)
BUN: 16 mg/dL (ref 8–23)
CO2: 24 mmol/L (ref 22–32)
Calcium: 8.9 mg/dL (ref 8.9–10.3)
Chloride: 108 mmol/L (ref 98–111)
Creatinine, Ser: 1.02 mg/dL (ref 0.61–1.24)
GFR calc Af Amer: >60 mL/min (ref >60)
GFR calc non Af Amer: >60 mL/min (ref >60)
Glucose, Bld: 120 mg/dL — ABNORMAL HIGH (ref 70–99)
Potassium: 4.1 mmol/L (ref 3.5–5.1)
Sodium: 139 mmol/L (ref 135–145)
Total Bilirubin: 1 mg/dL (ref 0.3–1.2)
Total Protein: 6.6 g/dL (ref 6.5–8.1)

## 2019-12-28 LAB — CEA (IN HOUSE-CHCC): CEA (CHCC-In House): 1.4 ng/mL (ref 0.00–5.00)

## 2019-12-28 NOTE — Telephone Encounter (Signed)
Scheduled appt per 2/12 los - pt aware - my chart active

## 2019-12-29 ENCOUNTER — Encounter: Payer: Self-pay | Admitting: Hematology

## 2020-01-11 ENCOUNTER — Ambulatory Visit: Payer: Medicare Other | Attending: Internal Medicine

## 2020-01-11 DIAGNOSIS — Z23 Encounter for immunization: Secondary | ICD-10-CM

## 2020-01-11 NOTE — Progress Notes (Signed)
   Covid-19 Vaccination Clinic  Name:  Jermaine Smith    MRN: [id number] DOB: 1951-07-05  01/11/2020  Mr. Jermaine Smith was observed post Covid-19 immunization for 15 minutes without incidence. He was provided with Vaccine Information Sheet and instruction to access the V-Safe system.   Mr. Jermaine Smith was instructed to call 911 with any severe reactions post vaccine: Marland Kitchen Difficulty breathing  . Swelling of your face and throat  . A fast heartbeat  . A bad rash all over your body  . Dizziness and weakness    Immunizations Administered    Name Date Dose VIS Date Route   Pfizer COVID-19 Vaccine 01/11/2020 11:13 AM 0.3 mL 10/26/2019 Intramuscular   Manufacturer: [place]   Lot: [id number]   [place]: [id number]

## 2020-02-06 ENCOUNTER — Ambulatory Visit: Payer: Medicare Other | Attending: Internal Medicine

## 2020-02-06 DIAGNOSIS — Z23 Encounter for immunization: Secondary | ICD-10-CM

## 2020-02-06 NOTE — Progress Notes (Signed)
   Covid-19 Vaccination Clinic  Name:  Jermaine Smith    MRN: [id number] DOB: 1951/01/14  02/06/2020  Mr. Jermaine Smith was observed post Covid-19 immunization for 15 minutes without incident. He was provided with Vaccine Information Sheet and instruction to access the V-Safe system.   Mr. Jermaine Smith was instructed to call 911 with any severe reactions post vaccine: Marland Kitchen Difficulty breathing  . Swelling of face and throat  . A fast heartbeat  . A bad rash all over body  . Dizziness and weakness   Immunizations Administered    Name Date Dose VIS Date Route   Pfizer COVID-19 Vaccine 02/06/2020 10:48 AM 0.3 mL 10/26/2019 Intramuscular   Manufacturer: [place]   Lot: [id number]   [place]: [id number]

## 2020-08-08 IMAGING — CT CT CHEST W/ CM
2 of 5 series · 13 of 36 positions shown, 16 images · IV contrast (OMNIPAQUE)
Comparison: 03/26/2016 and 03/23/2016

CLINICAL DATA: Followup rectal carcinoma. Restaging. Status post
surgery, radiation therapy, and chemotherapy.

EXAM:
CT CHEST, ABDOMEN, AND PELVIS WITH CONTRAST
TECHNIQUE: Multidetector CT imaging of the chest, abdomen and pelvis was
performed following the standard protocol during bolus
administration of intravenous contrast.
CONTRAST:  100mL OMNIPAQUE IOHEXOL 300 MG/ML  SOLN

[Series 2: cap with · axial · 0.78mm/px · z∈[+919,+1499]mm · 10 of 142 slices shown, 13 images]
[im 13/142  mediastinal]
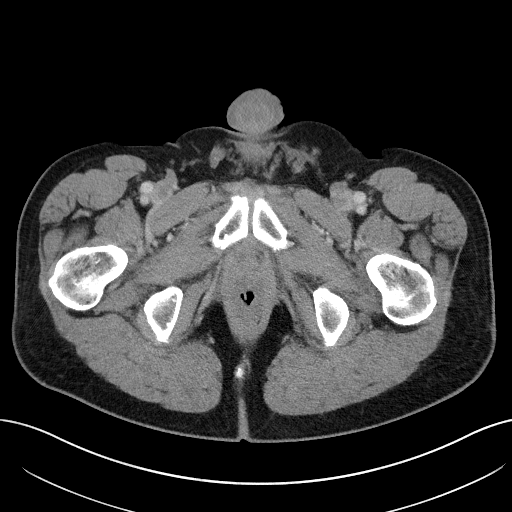
[im 13/142  lung]
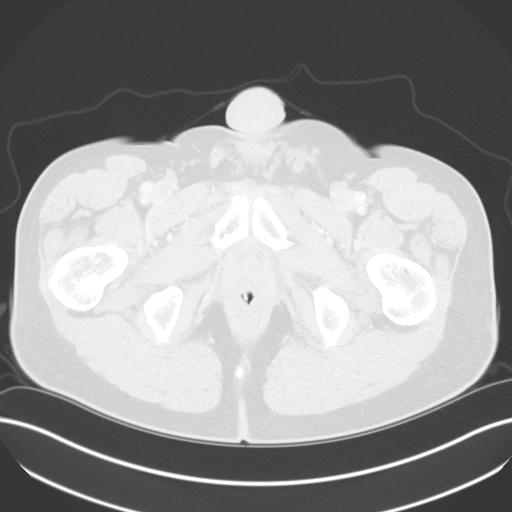
[im 26/142  lung]
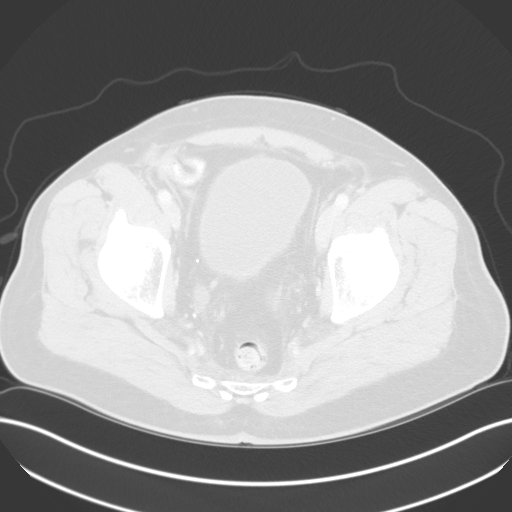
[im 39/142  lung]
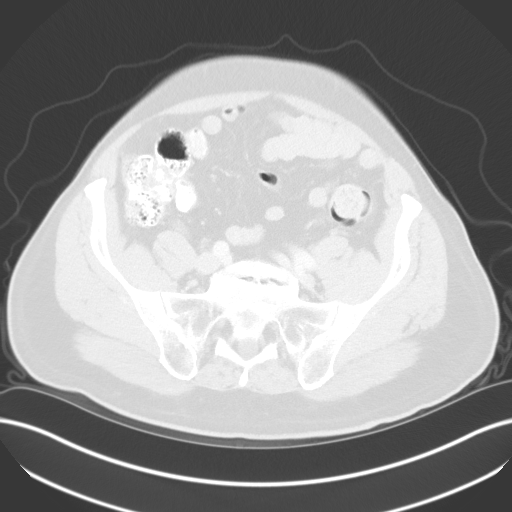
[im 52/142  lung]
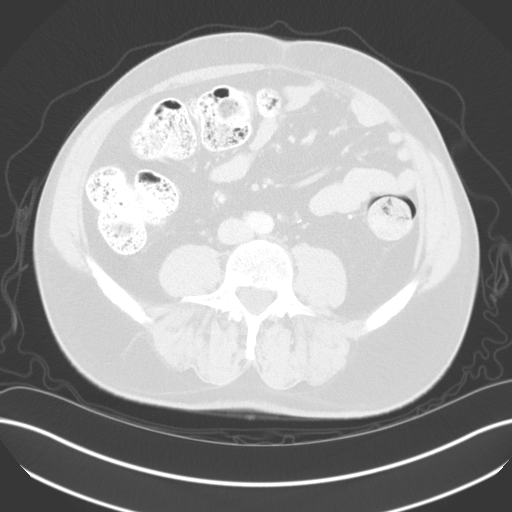
[im 65/142  mediastinal]
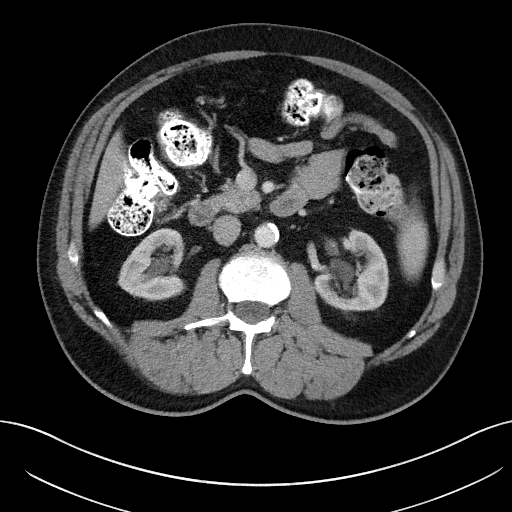
[im 65/142  lung]
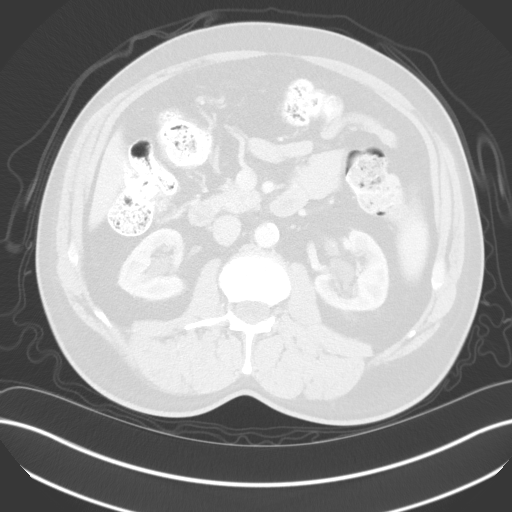
[im 77/142  lung]
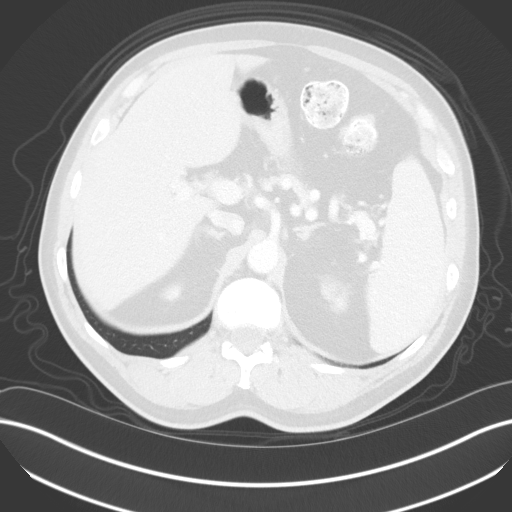
[im 90/142  lung]
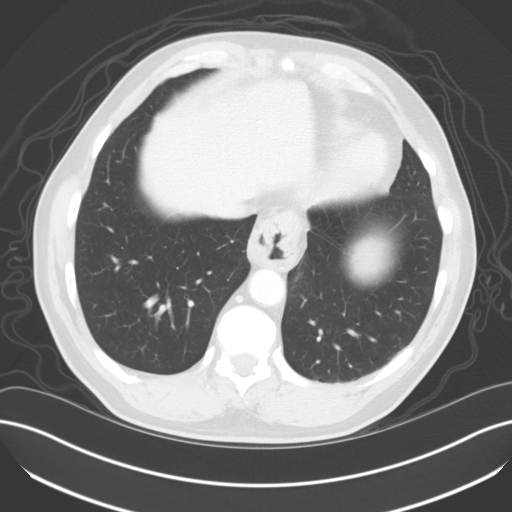
[im 103/142  lung]
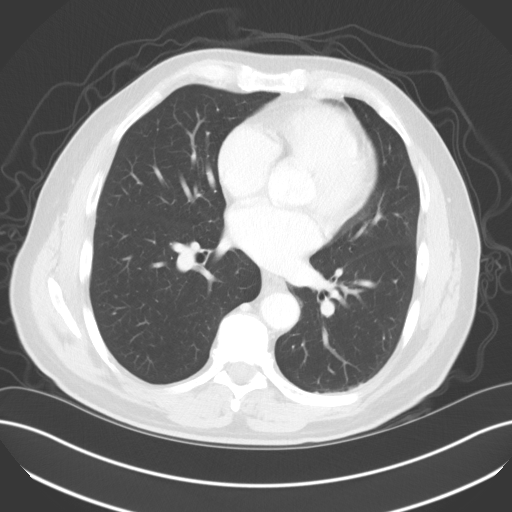
[im 116/142  mediastinal]
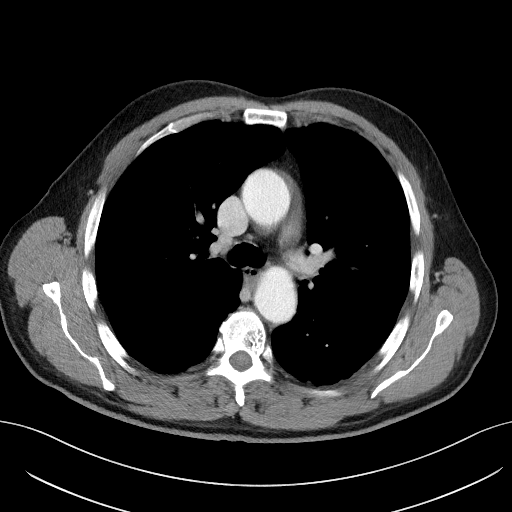
[im 116/142  lung]
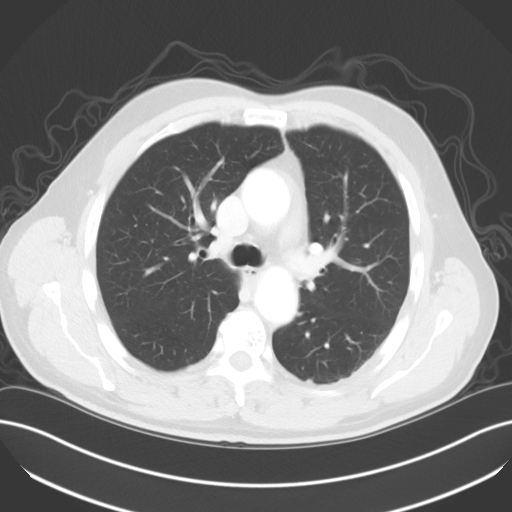
[im 129/142  lung]
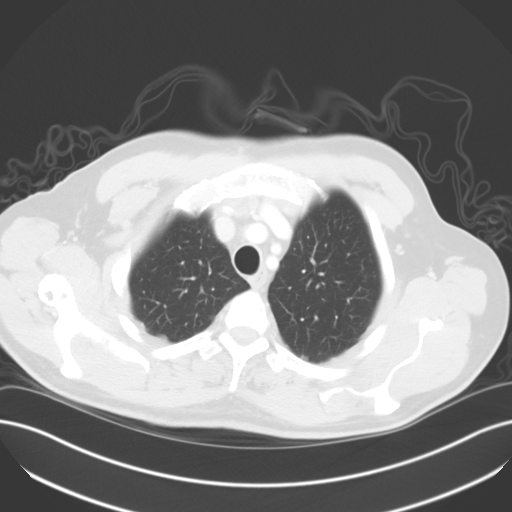

[Series 5: coronals · coronal · 0.73mm/px · 3 of 164 slices shown]
[im 33/164  lung]
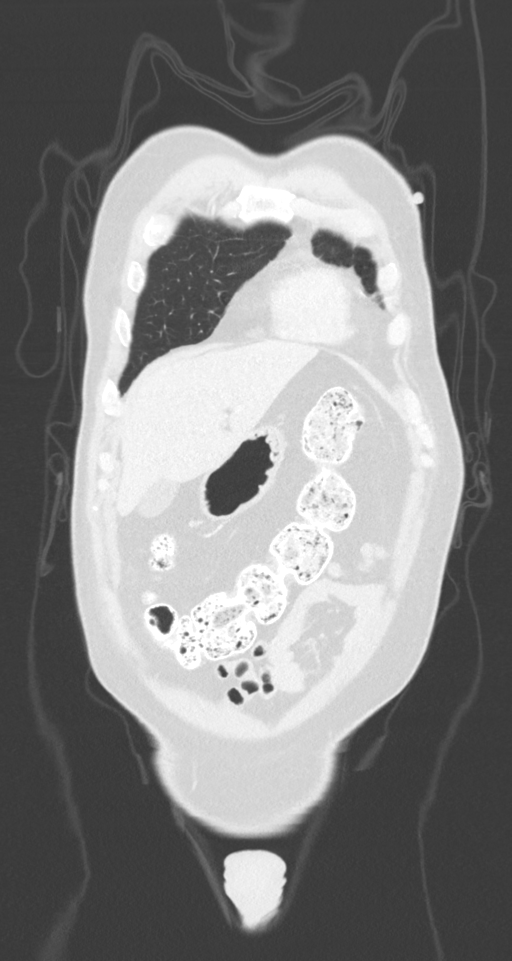
[im 66/164  lung]
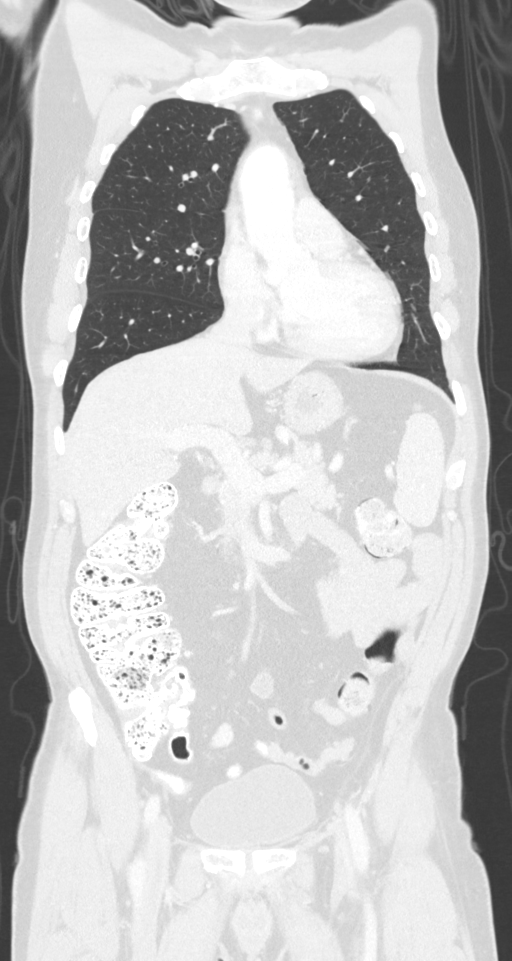
[im 98/164  lung]
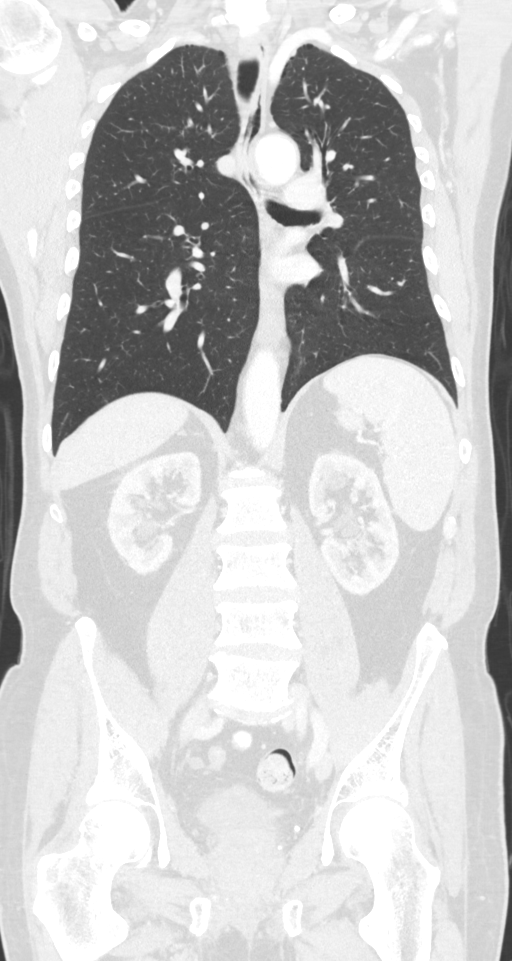

[13 of 36 positions shown; findings below may reference images not displayed]

FINDINGS: CT CHEST FINDINGS

Cardiovascular: No acute findings. Aortic atherosclerosis.

Mediastinum/Lymph Nodes: No masses or pathologically enlarged lymph
nodes identified.

Lungs/Pleura: No pulmonary infiltrate or mass identified. No
effusion present.

Musculoskeletal:  No suspicious bone lesions identified.

CT ABDOMEN AND PELVIS FINDINGS

Hepatobiliary: No masses identified. Gallbladder is unremarkable.

Pancreas:  No mass or inflammatory changes.

Spleen:  Within normal limits in size and appearance.

Adrenals/Urinary tract: No masses or hydronephrosis. Several small
left renal cysts again noted. Unremarkable unopacified urinary
bladder.

Stomach/Bowel: Small hiatal hernia again seen. No evidence of
obstruction, inflammatory process, or abnormal fluid collections.

Vascular/Lymphatic: No pathologically enlarged lymph nodes
identified. No abdominal aortic aneurysm. Aortic atherosclerosis.

Reproductive:  No mass or other significant abnormality identified.

Other:  None.

Musculoskeletal: No suspicious bone lesions identified. Stable
sclerotic lesion in the L3 vertebral body, consistent with benign
etiology.
IMPRESSION: 1. No evidence of recurrent or metastatic carcinoma within the
chest, abdomen, or pelvis.
2. Small hiatal hernia.

## 2020-08-18 DIAGNOSIS — Z23 Encounter for immunization: Secondary | ICD-10-CM | POA: Diagnosis not present

## 2020-10-30 DIAGNOSIS — Z23 Encounter for immunization: Secondary | ICD-10-CM | POA: Diagnosis not present

## 2020-11-12 DIAGNOSIS — L820 Inflamed seborrheic keratosis: Secondary | ICD-10-CM | POA: Diagnosis not present

## 2020-12-15 DIAGNOSIS — Q825 Congenital non-neoplastic nevus: Secondary | ICD-10-CM | POA: Diagnosis not present

## 2020-12-24 NOTE — Progress Notes (Signed)
[facility]   [phone] [phone]   Clinic Follow up Note   Patient Care Team: Maury Dus, MD as PCP - General (Family Medicine) Ladene Artist, MD as Consulting Physician (Gastroenterology) Leighton Ruff, MD as Consulting Physician (General Surgery) Truitt Merle, MD as Consulting Physician (Hematology) Kyung Rudd, MD as Consulting Physician (Radiation Oncology) Tania Ade, RN as Registered Nurse Michael Boston, MD as Consulting Physician (General Surgery)  Date of Service:  12/26/2020  CHIEF COMPLAINT: F/u of Proximal rectal cancer  SUMMARY OF ONCOLOGIC HISTORY: Oncology History Overview Note  Cancer Staging Rectal cancer ypT2ypN1cM0 s/p robotic LAR 08/04/2016 Staging form: Colon and Rectum, AJCC 7th Edition - Clinical stage from 03/19/2016: Stage IIIB (T3, N1, M0) - Signed by Truitt Merle, MD on 04/02/2016 - Pathologic stage from 08/04/2016: Stage IIIA (T2, N1b, cM0) - Signed by Truitt Merle, MD on 08/26/2016      Rectal cancer ypT2ypN1cM0 s/p robotic LAR 08/04/2016  03/19/2016 Initial Diagnosis   Rectal cancer ([place])   03/19/2016 Initial Biopsy   Rectal mass biopsy showed invasive adenocarcinoma. Sigmoid colon polyps showed tubular adenoma.    03/19/2016 Procedure   Colonoscopy showed a fungating partially obstructing mass in the proximal rectum, partially circumferential involving two thirds of the lumen circumference, measuring 6 x 4 cm, 10-16 cm from the anal verge. A 10 mm polyps was found in the sigmoid colon.   03/22/2016 Tumor Marker   CEA=1.1   03/23/2016 Imaging   CT chest, abdomen and pelvis with contrast showed asymmetric rectal mass approximately 10 cm from the anus, 2 suspicious lymph nodes in the deep pelvis concerning for local nodal metastasis. No other evidence of metastatic disease.   03/25/2016 Procedure   EUS showed a T3 proximal rectal mass, there was one small 6 mm suspicious for rectal node that was suspicious for malignancy  (uN1)   04/14/2016 - 05/24/2016 Chemotherapy   Xeloda 1800 mg every 12 hours, Monday through Friday, with concurrent radiation   04/14/2016 - 05/24/2016 Radiation Therapy    Adjuvant irradiation to his rectal cancer.   08/04/2016 Surgery   Robotic-assisted low anterior resection of rectal cancer.   08/04/2016 Pathology Results   Rectosigmoid segmental resection showed adenocarcinoma of the rectum, grade 2, tumor invades muscularis propria, margins were negative. Metastatic adenocarcinoma was seen treatment effect in 2 lymph nodes, a total of 4 nodes.     09/09/2016 - 12/23/2016 Chemotherapy   Adjuvant chemo with CAPOX. He has hypersensitive reaction to oxaliplatin (difficulty breathing), and oxaliplatin was held after cycle 2, changed to Xeloda 1068m/m2 (25019min am and 20001mn pm), 2 weeks on and one week off, for additional 3 cycles     06/20/2017 Imaging   CT CAP W Contrast 06/20/17  IMPRESSION: 1. Post therapy related findings in the pelvis including anastomotic staple line in the rectum, along with perirectal and presacral density likely the result of prior radiation therapy. 2. Oval-shaped right inferior gluteal lymph node 1.8 by 1.3 cm, formerly 1.5 by 2.3 cm. 3. Mildly prominent in size appendix, somewhat low-density, appendiceal tip diameter 1.0 cm. Difficult to exclude appendiceal mucocele, surveillance versus appendectomy suggested. 4. New lucency along the right posterior inferior margin of the L4 vertebral body, probably a Schmorl's node given the contiguity with the disc. 5. Small to moderate-sized type 1 hiatal hernia. 6.  Aortic Atherosclerosis (ICD10-I70.0).   09/13/2017 Procedure   Colonoscopy 09/13/17 IMPRESSION:  - Patent end-to-end colo-colonic anastomosis, characterized by healthy appearing mucosa. - The  examination was otherwise normal on direct views. - No specimens collected.   02/21/2018 Imaging   CT AP W Contrast 02/21/18 IMPRESSION: 1. Postoperative  findings in the lower rectum, with post therapy related findings in the region including stranding along the perirectal space margins and presacral stranding. The only potential finding of concern is a 1.3 cm in short axis right obturator lymph node, previously measured at 1.5 cm. No focal liver lesion. 2. Abnormal low-density and distention of the appendix with a 9 mm diameter. No periappendiceal stranding. Appearance concerning for appendiceal mucocele. Surgical consultation recommended. 3. Other imaging findings of potential clinical significance: Small type 1 hiatal hernia. Aortic Atherosclerosis (ICD10-I70.0). Dependent sludge or gallstones in the gallbladder.   12/26/2018 Imaging   CT CAP W Contrast IMPRESSION: 1. No evidence of recurrent or metastatic carcinoma within the chest, abdomen, or pelvis. 2. Small hiatal hernia.        CURRENT THERAPY:  Surveillance  INTERVAL HISTORY:  Jermaine Smith is here for a follow up of rectal cancer. He was last seen by me 1 year ago. He presents to the clinic alone. He notes he is doing well. He notes he was due for colonoscopy in 07/2020, but was not contacted to schedule appointment. He plans to contact them. He notes overall regular BM. He is eating adequately. He denies abdominal pain. He notes he has received his flu vaccine and COVID booster in 2021.    REVIEW OF SYSTEMS:   Constitutional: Denies fevers, chills or abnormal weight loss Eyes: Denies blurriness of vision Ears, nose, mouth, throat, and face: Denies mucositis or sore throat Respiratory: Denies cough, dyspnea or wheezes Cardiovascular: Denies palpitation, chest discomfort or lower extremity swelling Gastrointestinal:  Denies nausea, heartburn or change in bowel habits Skin: Denies abnormal skin rashes Lymphatics: Denies new lymphadenopathy or easy bruising Neurological:Denies numbness, tingling or new weaknesses Behavioral/Psych: Mood is stable, no new changes  All other  systems were reviewed with the patient and are negative.  MEDICAL HISTORY:  Past Medical History:  Diagnosis Date  . FHx: brain aneurysm   . GERD (gastroesophageal reflux disease)   . Hiatal hernia   . Left inguinal hernia   . Rectal cancer ([place])   . Skin cancer    hx basal cell carcinoma 08/31/17 pt denies/AW    SURGICAL HISTORY: Past Surgical History:  Procedure Laterality Date  . CLAVICLE SURGERY    . EUS N/A 03/25/2016   Procedure: LOWER ENDOSCOPIC ULTRASOUND (EUS);  Surgeon: Milus Banister, MD;  Location: [facility];  Service: Endoscopy;  Laterality: N/A;  . INGUINAL HERNIA REPAIR  1961   open right inguinal age 32 y/o  . lap bilateral ing. hernia repair Bilateral 05/09/12  . XI ROBOTIC ASSISTED LOWER ANTERIOR RESECTION N/A 08/04/2016   Procedure: XI ROBOTIC ASSISTED LOWER ANTERIOR RESECTION WITH RIGID PROCTOSCOPY;  Surgeon: Leighton Ruff, MD;  Location: [facility];  Service: General;  Laterality: N/A;    I have reviewed the social history and family history with the patient and they are unchanged from previous note.  ALLERGIES:  has No Known Allergies.  MEDICATIONS:  Current Outpatient Medications  Medication Sig Dispense Refill  . aspirin 81 MG tablet Take 81 mg by mouth every morning.     . Cholecalciferol (VITAMIN D PO) Take 1 tablet by mouth daily.    Marland Kitchen dexlansoprazole (DEXILANT) 60 MG capsule Take 60 mg by mouth every morning.     Marland Kitchen FIBER SELECT GUMMIES PO Take by mouth.    Marland Kitchen  Multiple Vitamin (MULTIVITAMIN) tablet Take 1 tablet by mouth every morning.      No current facility-administered medications for this visit.    PHYSICAL EXAMINATION: ECOG PERFORMANCE STATUS: 0 - Asymptomatic  Vitals:   12/26/20 1023  BP: (!) 166/88  Pulse: 67  Resp: 18  Temp: 97.7 F (36.5 C)  SpO2: 99%   Filed Weights   12/26/20 1023  Weight: 226 lb 3.2 oz (102.6 kg)    GENERAL:alert, no distress and comfortable SKIN: skin color, texture, turgor are normal, no rashes or  significant lesions EYES: normal, Conjunctiva are pink and non-injected, sclera clear  NECK: supple, thyroid normal size, non-tender, without nodularity LYMPH:  no palpable lymphadenopathy in the cervical, axillary  LUNGS: clear to auscultation and percussion with normal breathing effort HEART: regular rate & rhythm and no murmurs and no lower extremity edema ABDOMEN:abdomen soft, non-tender and normal bowel sounds Musculoskeletal:no cyanosis of digits and no clubbing  NEURO: alert & oriented x 3 with fluent speech, no focal motor/sensory deficits  LABORATORY DATA:  I have reviewed the data as listed CBC Latest Ref Rng & Units 12/26/2020 12/28/2019 06/28/2019  WBC 4.0 - 10.5 K/uL 4.8 4.3 4.9  Hemoglobin 13.0 - 17.0 g/dL 13.7 13.3 13.4  Hematocrit 39.0 - 52.0 % 38.3(L) 37.4(L) 38.2(L)  Platelets 150 - 400 K/uL 160 158 167     CMP Latest Ref Rng & Units 12/26/2020 12/28/2019 06/28/2019  Glucose 70 - 99 mg/dL 105(H) 120(H) 115(H)  BUN 8 - 23 mg/dL 14 16 16   Creatinine 0.61 - 1.24 mg/dL 1.10 1.02 1.16  Sodium 135 - 145 mmol/L 137 139 136  Potassium 3.5 - 5.1 mmol/L 4.1 4.1 4.0  Chloride 98 - 111 mmol/L 109 108 105  CO2 22 - 32 mmol/L 23 24 23   Calcium 8.9 - 10.3 mg/dL 9.1 8.9 9.4  Total Protein 6.5 - 8.1 g/dL 7.0 6.6 6.9  Total Bilirubin 0.3 - 1.2 mg/dL 1.0 1.0 1.0  Alkaline Phos 38 - 126 U/L 81 68 87  AST 15 - 41 U/L 18 16 15   ALT 0 - 44 U/L 16 13 13       RADIOGRAPHIC STUDIES: I have personally reviewed the radiological images as listed and agreed with the findings in the report. No results found.   ASSESSMENT & PLAN:  Jermaine Smith is a 70 y.o. male with   1. Proximal rectal adenocarcinoma, uT3N1M0, stag IIIB, ypT2N1M0, MMR normal -He was diagnosed 03/19/2016. Hecompleted neoadjuvant chemoradiation with Xeloda 03/2016-05/2016. He is s/p robotic-assisted low anterior resection of rectal cancer. He completed adjuvant Xeloda for 5 cycles (with oxaliplatin for 2 cycles). He is currently  on surveillance.  -He is clinically doing well. Labs reviewed, CBC and CMP WNL. Physical exam unremarkable. There is no clinical concern for recurrence.  -His surveillance colonoscopy was 08/2017. Dr. Fuller Plan was normal. He is due to repeat, I recommend he proceed in spring 2022. He is agreeable. Given age he may not need to repeat again.  -He is nearly 5 years from his cancer diagnosis. His risk of recurrence is minimal now. He has completed cancer surveillance with me, will see him as needed in the future.    2. Health maintenance, Cancer screenings -He has received his up to date Flu vaccine and COVID booster in 2021.  -I recommend he continue age appropriate cancer screenings regularly.    Plan -He has completed 5 year cancer surveillance  -F/u as needed in the future  -Copy note to Dr Fuller Plan for  colonoscopy in spring 2022   No problem-specific Assessment & Plan notes found for this encounter.   No orders of the defined types were placed in this encounter.  All questions were answered. The patient knows to call the clinic with any problems, questions or concerns. No barriers to learning was detected. The total time spent in the appointment was 20 minutes.     Truitt Merle, MD 12/26/2020   I, Joslyn Devon, am acting as scribe for Truitt Merle, MD.   I have reviewed the above documentation for accuracy and completeness, and I agree with the above.

## 2020-12-26 ENCOUNTER — Inpatient Hospital Stay: Payer: Medicare Other

## 2020-12-26 ENCOUNTER — Other Ambulatory Visit: Payer: Self-pay

## 2020-12-26 ENCOUNTER — Encounter: Payer: Self-pay | Admitting: Gastroenterology

## 2020-12-26 ENCOUNTER — Encounter: Payer: Self-pay | Admitting: Hematology

## 2020-12-26 ENCOUNTER — Inpatient Hospital Stay: Payer: Medicare Other | Attending: Hematology | Admitting: Hematology

## 2020-12-26 VITALS — BP 166/88 | HR 67 | Temp 97.7°F | Resp 18 | Ht 76.0 in | Wt 226.2 lb

## 2020-12-26 DIAGNOSIS — Z923 Personal history of irradiation: Secondary | ICD-10-CM | POA: Diagnosis not present

## 2020-12-26 DIAGNOSIS — C20 Malignant neoplasm of rectum: Secondary | ICD-10-CM | POA: Diagnosis not present

## 2020-12-26 DIAGNOSIS — I700 Atherosclerosis of aorta: Secondary | ICD-10-CM | POA: Diagnosis not present

## 2020-12-26 DIAGNOSIS — K449 Diaphragmatic hernia without obstruction or gangrene: Secondary | ICD-10-CM | POA: Diagnosis not present

## 2020-12-26 DIAGNOSIS — Z79899 Other long term (current) drug therapy: Secondary | ICD-10-CM | POA: Diagnosis not present

## 2020-12-26 LAB — CBC WITH DIFFERENTIAL/PLATELET
Abs Immature Granulocytes: 0.02 10*3/uL (ref 0.00–0.07)
Basophils Absolute: 0 10*3/uL (ref 0.0–0.1)
Basophils Relative: 1 %
Eosinophils Absolute: 0.1 10*3/uL (ref 0.0–0.5)
Eosinophils Relative: 3 %
HCT: 38.3 % — ABNORMAL LOW (ref 39.0–52.0)
Hemoglobin: 13.7 g/dL (ref 13.0–17.0)
Immature Granulocytes: 0 %
Lymphocytes Relative: 19 %
Lymphs Abs: 0.9 10*3/uL (ref 0.7–4.0)
MCH: 30.4 pg (ref 26.0–34.0)
MCHC: 35.8 g/dL (ref 30.0–36.0)
MCV: 84.9 fL (ref 80.0–100.0)
Monocytes Absolute: 0.6 10*3/uL (ref 0.1–1.0)
Monocytes Relative: 12 %
Neutro Abs: 3.2 10*3/uL (ref 1.7–7.7)
Neutrophils Relative %: 65 %
Platelets: 160 10*3/uL (ref 150–400)
RBC: 4.51 MIL/uL (ref 4.22–5.81)
RDW: 12.7 % (ref 11.5–15.5)
WBC: 4.8 10*3/uL (ref 4.0–10.5)
nRBC: 0 % (ref 0.0–0.2)

## 2020-12-26 LAB — COMPREHENSIVE METABOLIC PANEL
ALT: 16 U/L (ref 0–44)
AST: 18 U/L (ref 15–41)
Albumin: 4.1 g/dL (ref 3.5–5.0)
Alkaline Phosphatase: 81 U/L (ref 38–126)
Anion gap: 5 (ref 5–15)
BUN: 14 mg/dL (ref 8–23)
CO2: 23 mmol/L (ref 22–32)
Calcium: 9.1 mg/dL (ref 8.9–10.3)
Chloride: 109 mmol/L (ref 98–111)
Creatinine, Ser: 1.1 mg/dL (ref 0.61–1.24)
GFR, Estimated: >60 mL/min (ref >60)
Glucose, Bld: 105 mg/dL — ABNORMAL HIGH (ref 70–99)
Potassium: 4.1 mmol/L (ref 3.5–5.1)
Sodium: 137 mmol/L (ref 135–145)
Total Bilirubin: 1 mg/dL (ref 0.3–1.2)
Total Protein: 7 g/dL (ref 6.5–8.1)

## 2020-12-29 ENCOUNTER — Telehealth: Payer: Self-pay | Admitting: Hematology

## 2020-12-29 ENCOUNTER — Telehealth: Payer: Self-pay

## 2020-12-29 NOTE — Telephone Encounter (Signed)
Left message for patient to return my call.

## 2020-12-29 NOTE — Telephone Encounter (Addendum)
Checked out appointment. No LOS notes needing to be scheduled. No changes made. 

## 2020-12-29 NOTE — Telephone Encounter (Signed)
-----   Message from Ladene Artist, MD sent at 12/26/2020 11:44 AM EST ----- Please contact this patient to schedule his surveillance colonoscopy. Recall was due 08/2020 and his Oncology note indicates need to schedule.    ----- Message ----- From: Truitt Merle, MD Sent: 12/26/2020  11:34 AM EST To: Ladene Artist, MD

## 2020-12-30 NOTE — Telephone Encounter (Signed)
Left message for patient to return my call.

## 2020-12-31 NOTE — Telephone Encounter (Signed)
Will mail letter for patient to schedule recall colonoscopy.

## 2021-03-25 DIAGNOSIS — Q825 Congenital non-neoplastic nevus: Secondary | ICD-10-CM | POA: Diagnosis not present

## 2021-05-18 DIAGNOSIS — Z20822 Contact with and (suspected) exposure to covid-19: Secondary | ICD-10-CM | POA: Diagnosis not present

## 2021-09-03 DIAGNOSIS — Z23 Encounter for immunization: Secondary | ICD-10-CM | POA: Diagnosis not present

## 2021-09-23 DIAGNOSIS — Z20828 Contact with and (suspected) exposure to other viral communicable diseases: Secondary | ICD-10-CM | POA: Diagnosis not present

## 2021-10-05 DIAGNOSIS — Z23 Encounter for immunization: Secondary | ICD-10-CM | POA: Diagnosis not present

## 2021-12-21 DIAGNOSIS — Z20822 Contact with and (suspected) exposure to covid-19: Secondary | ICD-10-CM | POA: Diagnosis not present

## 2022-02-19 DIAGNOSIS — Z20828 Contact with and (suspected) exposure to other viral communicable diseases: Secondary | ICD-10-CM | POA: Diagnosis not present

## 2022-02-19 DIAGNOSIS — Z1152 Encounter for screening for COVID-19: Secondary | ICD-10-CM | POA: Diagnosis not present

## 2022-03-01 DIAGNOSIS — Z20822 Contact with and (suspected) exposure to covid-19: Secondary | ICD-10-CM | POA: Diagnosis not present

## 2022-05-06 DIAGNOSIS — Q278 Other specified congenital malformations of peripheral vascular system: Secondary | ICD-10-CM | POA: Diagnosis not present

## 2022-05-06 DIAGNOSIS — L820 Inflamed seborrheic keratosis: Secondary | ICD-10-CM | POA: Diagnosis not present

## 2022-05-06 DIAGNOSIS — L538 Other specified erythematous conditions: Secondary | ICD-10-CM | POA: Diagnosis not present

## 2022-05-06 DIAGNOSIS — R208 Other disturbances of skin sensation: Secondary | ICD-10-CM | POA: Diagnosis not present

## 2022-05-06 DIAGNOSIS — Z789 Other specified health status: Secondary | ICD-10-CM | POA: Diagnosis not present

## 2022-05-06 DIAGNOSIS — L298 Other pruritus: Secondary | ICD-10-CM | POA: Diagnosis not present

## 2022-05-27 DIAGNOSIS — H43811 Vitreous degeneration, right eye: Secondary | ICD-10-CM | POA: Diagnosis not present

## 2022-06-28 DIAGNOSIS — H43811 Vitreous degeneration, right eye: Secondary | ICD-10-CM | POA: Diagnosis not present

## 2022-09-02 DIAGNOSIS — H43391 Other vitreous opacities, right eye: Secondary | ICD-10-CM | POA: Diagnosis not present

## 2022-09-02 DIAGNOSIS — H43811 Vitreous degeneration, right eye: Secondary | ICD-10-CM | POA: Diagnosis not present

## 2022-09-17 DIAGNOSIS — Z23 Encounter for immunization: Secondary | ICD-10-CM | POA: Diagnosis not present

## 2023-04-22 ENCOUNTER — Encounter: Payer: Self-pay | Admitting: Gastroenterology

## 2023-06-17 ENCOUNTER — Telehealth: Payer: Self-pay

## 2023-06-17 ENCOUNTER — Ambulatory Visit: Payer: Medicare Other

## 2023-06-17 VITALS — Ht 76.0 in | Wt 220.0 lb

## 2023-06-17 DIAGNOSIS — R1310 Dysphagia, unspecified: Secondary | ICD-10-CM

## 2023-06-17 DIAGNOSIS — Z85048 Personal history of other malignant neoplasm of rectum, rectosigmoid junction, and anus: Secondary | ICD-10-CM

## 2023-06-17 MED ORDER — NA SULFATE-K SULFATE-MG SULF 17.5-3.13-1.6 GM/177ML PO SOLN: 1.0000 | Freq: Once | ORAL | 0 refills | Status: AC

## 2023-06-17 NOTE — Telephone Encounter (Signed)
Hey Dr. Russella Dar I am doing Mr. Zephan PV. He brought it to my attention that he has been having some difficulties with feeling like food is stuck when he is eating and the frequency has been increasing. His last EGD was back in 99 or 2000. At that time he had hiatal hernia and GERD at that time with no mention of a follow up. Patient is currently taking nexium 40 mg every other day and TUMs on addition to help with his symptoms. He is wanting to know if it would be possible for him to have a repeat EGD.   Please advise if you would like him to come in for a OV or if you're ok with him being scheduled. Patient is ok with doing the procedures at two different times.

## 2023-06-17 NOTE — Progress Notes (Signed)

## 2023-06-20 NOTE — Telephone Encounter (Signed)
Noted  

## 2023-06-20 NOTE — Telephone Encounter (Signed)
Apt scheduled with Marchelle Folks on 8/13 at 10:30 pt is aware

## 2023-06-20 NOTE — Telephone Encounter (Signed)
Hey [name],   Please see the attached message from Dr. Russella Dar. I did this patients PV but he is experiencing some dysphagia. Pt needs urgent visit with Dr. Russella Dar or APP ( the soonest visits I see are in November) as well as a barium swallow. Can you please get this set up and make the patient aware.

## 2023-06-20 NOTE — Telephone Encounter (Signed)
Need to get appt with Marchelle Folks for 8/13- barium swallow with tablet ordered and sent to the schedulers

## 2023-06-27 NOTE — Progress Notes (Unsigned)
06/28/2023 Chipper Herb [id number] November 26, 1950  Referring provider: No ref. provider found Primary GI doctor: Dr. Russella Dar  ASSESSMENT AND PLAN:   Esophageal dysphagia with GERD Worse with foods, worse last several months Will get barium swallow and proceed to scheudle EGD with colon at [facility] with Dr. Russella Dar.  EGD with dilatation to evaluate for stenosis, tumor, erosive/infectious esophagititis, and EOE.   I discussed risks of EGD with patient today, including risk of sedation, bleeding or perforation.  Patient provides understanding and gave verbal consent to proceed. Lifestyle changes discussed, avoid NSAIDS, ETOH Continue nexium, can increase to daily rather than every other day  History of rectal cancer diagnosed 2017 s/p chemo/radiation, and surgery 07/2016 Colon 2018 normal mucosa Overdue for repeat, will schedule at [facility] with EGD with Dr. Russella Dar We have discussed the risks of bleeding, infection, perforation, medication reactions, and remote risk of death associated with colonoscopy. All questions were answered and the patient acknowledges these risk and wishes to proceed.  Patient Care Team: Elias Else, MD (Inactive) as PCP - General (Family Medicine) Meryl Dare, MD as Consulting Physician (Gastroenterology) Romie Levee, MD as Consulting Physician (General Surgery) Malachy Mood, MD as Consulting Physician (Hematology) Dorothy Puffer, MD as Consulting Physician (Radiation Oncology) Wandalee Ferdinand, RN as Registered Nurse Karie Soda, MD as Consulting Physician (General Surgery)  HISTORY OF PRESENT ILLNESS: 73 y.o. male with a past medical history of rectal cancer, GERD, hiatal hernia and others listed below presents for evaluation of dysphagia.   03/19/2016 initial diagnosis of rectal cancer with colonoscopy showing fungating partially obstructing mass proximal rectum, biopsy showing invasive adenocarcinoma, 10 mm sigmoid tubular adenomatous polyps. 03/23/2016 CT chest  abdomen pelvis with contrast showed 2 suspicious lymph nodes deep pelvis otherwise no metastatic disease. 5/31 through 05/24/2016 Xeloda chemotherapy and adjunct radiation therapy. 08/04/2016 robotic assisted lower anterior resection of rectal cancer with pathology showing rectosigmoid segmental resection adenocarcinoma of the rectum grade 2 tumor invasive propria margins negative.  2 of the 4 lymph nodes with metastatic adenocarcinoma. 09/09/2016 through 12/23/2016 adjuvant chemotherapy with CAPOX 09/13/2017 colonoscopy patent and in colocolonic anastomosis with healthy-appearing mucosa otherwise unremarkable. /07/2018 CTAP with contrast obturator lymph node, no liver lesion postoperative changes lower rectum with posttherapy related findings region including stranding along margins and presacral area abnormal density in the appendix 12/2018 CTAP with contrast unremarkable.  Patient was due for repeat colonoscopy in 2021 however this was not done. Patient called 06/17/2023 with dysphagia symptoms increasing in frequency. Presents for dysphagia and scheduling for EGD and colonoscopy. Barium swallow was ordered.  Wife is present and provides some of the history.  Last 2-3 months it has been worse with dysphagia.  He was diagnosed sliding HH in 1999, he has been on nexium every other day and pepcid daily, controls GERD well.  He states he has had a few occasions that he had food get stuck mid esophagus, would push water on top of it and that would help it push down.   He denies of melena, AB pain, nausea, vomiting.  Patient denies AB bloating/early satiety.  He denies unintentional weight loss. Denies history of neck surgery or radiation to the neck.  He has BM every day or every other day, some constipation, no hematochezia.   Wt Readings from Last 3 Encounters:  06/28/23 219 lb (99.3 kg)  06/17/23 220 lb (99.8 kg)  12/26/20 226 lb 3.2 oz (102.6 kg)    He denies blood thinner use.  He denies  NSAID use.  He denies ETOH use.   He denies tobacco use, remote smoking history 44 years ago.   He denies drug use.    He  reports that he quit smoking about 44 years ago. His smoking use included cigarettes. He started smoking about 49 years ago. He has a 2.5 pack-year smoking history. He has never used smokeless tobacco. He reports current alcohol use. He reports that he does not use drugs.  RELEVANT LABS AND IMAGING: CBC    Component Value Date/Time   WBC 4.8 12/26/2020 1000   RBC 4.51 12/26/2020 1000   HGB 13.7 12/26/2020 1000   HGB 13.2 10/27/2017 0958   HCT 38.3 (L) 12/26/2020 1000   HCT 37.4 (L) 10/27/2017 0958   PLT 160 12/26/2020 1000   PLT 152 10/27/2017 0958   MCV 84.9 12/26/2020 1000   MCV 86.4 10/27/2017 0958   MCH 30.4 12/26/2020 1000   MCHC 35.8 12/26/2020 1000   RDW 12.7 12/26/2020 1000   RDW 13.6 10/27/2017 0958   LYMPHSABS 0.9 12/26/2020 1000   LYMPHSABS 0.7 (L) 10/27/2017 0958   MONOABS 0.6 12/26/2020 1000   MONOABS 0.5 10/27/2017 0958   EOSABS 0.1 12/26/2020 1000   EOSABS 0.1 10/27/2017 0958   BASOSABS 0.0 12/26/2020 1000   BASOSABS 0.0 10/27/2017 0958   No results for input(s): "HGB" in the last 8760 hours.  CMP     Component Value Date/Time   NA 137 12/26/2020 1000   NA 140 10/27/2017 0958   K 4.1 12/26/2020 1000   K 4.1 10/27/2017 0958   CL 109 12/26/2020 1000   CO2 23 12/26/2020 1000   CO2 21 (L) 10/27/2017 0958   GLUCOSE 105 (H) 12/26/2020 1000   GLUCOSE 86 10/27/2017 0958   BUN 14 12/26/2020 1000   BUN 15.7 10/27/2017 0958   CREATININE 1.10 12/26/2020 1000   CREATININE 1.0 10/27/2017 0958   CALCIUM 9.1 12/26/2020 1000   CALCIUM 9.2 10/27/2017 0958   PROT 7.0 12/26/2020 1000   PROT 6.9 10/27/2017 0958   ALBUMIN 4.1 12/26/2020 1000   ALBUMIN 4.0 10/27/2017 0958   AST 18 12/26/2020 1000   AST 25 10/27/2017 0958   ALT 16 12/26/2020 1000   ALT 21 10/27/2017 0958   ALKPHOS 81 12/26/2020 1000   ALKPHOS 85 10/27/2017 0958   BILITOT 1.0  12/26/2020 1000   BILITOT 1.03 10/27/2017 0958   GFRNONAA >60 12/26/2020 1000   GFRAA >60 12/28/2019 1021      Latest Ref Rng & Units 12/26/2020   10:00 AM 12/28/2019   10:21 AM 06/28/2019   10:34 AM  Hepatic Function  Total Protein 6.5 - 8.1 g/dL 7.0  6.6  6.9   Albumin 3.5 - 5.0 g/dL 4.1  4.0  3.9   AST 15 - 41 U/L 18  16  15    ALT 0 - 44 U/L 16  13  13    Alk Phosphatase 38 - 126 U/L 81  68  87   Total Bilirubin 0.3 - 1.2 mg/dL 1.0  1.0  1.0       Current Medications:      Current Outpatient Medications (Analgesics):    aspirin 81 MG tablet, Take 81 mg by mouth every morning.    Current Outpatient Medications (Other):    Cholecalciferol (VITAMIN D PO), Take 1 tablet by mouth daily. 1000 units   dexlansoprazole (DEXILANT) 60 MG capsule, Take 60 mg by mouth every morning.   esomeprazole (NEXIUM) 40 MG capsule, Take 40  mg by mouth every other day.   FIBER SELECT GUMMIES PO, Take by mouth.   Multiple Vitamin (MULTIVITAMIN) tablet, Take 1 tablet by mouth every morning.    Na Sulfate-K Sulfate-Mg Sulf 17.5-3.13-1.6 GM/177ML SOLN, Take 1 kit by mouth once for 1 dose.  Medical History:  Past Medical History:  Diagnosis Date   FHx: brain aneurysm    GERD (gastroesophageal reflux disease)    Hiatal hernia    Left inguinal hernia    Rectal cancer ([facility])    Skin cancer    hx basal cell carcinoma 08/31/17 pt denies/AW   Allergies: No Known Allergies   Surgical History:  He  has a past surgical history that includes Inguinal hernia repair (1961); lap bilateral ing. hernia repair (Bilateral, 05/09/12); EUS (N/A, 03/25/2016); Clavicle surgery; and XI robotic assisted lower anterior resection (N/A, 08/04/2016). Family History:  His family history includes Clotting disorder in his mother; Heart disease in his father; Hypotension in his mother.  REVIEW OF SYSTEMS  : All other systems reviewed and negative except where noted in the History of Present Illness.  PHYSICAL EXAM: BP  136/80   Pulse 79   Ht 6\' 4"  (1.93 m)   Wt 219 lb (99.3 kg)   BMI 26.66 kg/m  General Appearance: Well nourished, in no apparent distress. Head:   Normocephalic and atraumatic. Eyes:  sclerae anicteric,conjunctive pink  Respiratory: Respiratory effort normal, BS equal bilaterally without rales, rhonchi, wheezing. Cardio: RRR with no MRGs. Peripheral pulses intact.  Abdomen: Soft,  Obese ,active bowel sounds. No tenderness. No masses. Rectal: Not evaluated Musculoskeletal: Full ROM, Normal gait. Without edema. Skin:  Dry and intact without significant lesions or rashes Neuro: Alert and  oriented x4;  No focal deficits. Psych:  Cooperative. Normal mood and affect.    Doree Albee, PA-C 11:32 AM

## 2023-06-28 ENCOUNTER — Ambulatory Visit (INDEPENDENT_AMBULATORY_CARE_PROVIDER_SITE_OTHER): Payer: Medicare Other | Admitting: Physician Assistant

## 2023-06-28 ENCOUNTER — Encounter: Payer: Self-pay | Admitting: Physician Assistant

## 2023-06-28 VITALS — BP 136/80 | HR 79 | Ht 76.0 in | Wt 219.0 lb

## 2023-06-28 DIAGNOSIS — K219 Gastro-esophageal reflux disease without esophagitis: Secondary | ICD-10-CM | POA: Diagnosis not present

## 2023-06-28 DIAGNOSIS — Z85048 Personal history of other malignant neoplasm of rectum, rectosigmoid junction, and anus: Secondary | ICD-10-CM

## 2023-06-28 DIAGNOSIS — R1319 Other dysphagia: Secondary | ICD-10-CM

## 2023-06-28 MED ORDER — NA SULFATE-K SULFATE-MG SULF 17.5-3.13-1.6 GM/177ML PO SOLN: 1.0000 | Freq: Once | ORAL | 0 refills | Status: AC

## 2023-06-28 NOTE — Patient Instructions (Addendum)
You have been scheduled for a Barium Esophogram at [place] floor of the hospital) on 07/04/2023 at 1:00pm. Please arrive 30 minutes prior to your appointment for registration. Make certain not to have anything to eat or drink 3 hours prior to your test. If you need to reschedule for any reason, please contact radiology at [phone] to do so. __________________________________________________________________ A barium swallow is an examination that concentrates on views of the esophagus. This tends to be a double contrast exam (barium and two liquids which, when combined, create a gas to distend the wall of the oesophagus) or single contrast (non-ionic iodine based). The study is usually tailored to your symptoms so a good history is essential. Attention is paid during the study to the form, structure and configuration of the esophagus, looking for functional disorders (such as aspiration, dysphagia, achalasia, motility and reflux) EXAMINATION You may be asked to change into a gown, depending on the type of swallow being performed. A radiologist and radiographer will perform the procedure. The radiologist will advise you of the type of contrast selected for your procedure and direct you during the exam. You will be asked to stand, sit or lie in several different positions and to hold a small amount of fluid in your mouth before being asked to swallow while the imaging is performed .In some instances you may be asked to swallow barium coated marshmallows to assess the motility of a solid food bolus. The exam can be recorded as a digital or video fluoroscopy procedure. POST PROCEDURE It will take 1-2 days for the barium to pass through your system. To facilitate this, it is important, unless otherwise directed, to increase your fluids for the next 24-48hrs and to resume your normal diet.  This test typically takes about 30 minutes to  perform. __________________________________________________________________________________  Bonita Quin have been scheduled for a colonoscopy/egd. Please follow written instructions given to you at your visit today.   Please pick up your prep supplies at the pharmacy within the next 1-3 days.  If you use inhalers (even only as needed), please bring them with you on the day of your procedure.  DO NOT TAKE 7 DAYS PRIOR TO TEST- Trulicity (dulaglutide) Ozempic, Wegovy (semaglutide) Mounjaro (tirzepatide) Bydureon Bcise (exanatide extended release)  DO NOT TAKE 1 DAY PRIOR TO YOUR TEST Rybelsus (semaglutide) Adlyxin (lixisenatide) Victoza (liraglutide) Byetta (exanatide) ___________________________________________________________________________   Dysphagia precautions:  1. Take reflux medications 30+ minutes before food in the morning 2. Begin meals with warm beverage 3. Eat smaller more frequent meals 4. Eat slowly, taking small bites and sips 5. Alternate solids and liquids 6. Avoid foods/liquids that increase acid production 7. Sit upright during and for 30+ minutes after meals to facilitate esophageal clearing 8. All meats should be chopped finely.   If something gets hung in your esophagus and will not come up or go down, proceed to the emergency room.    Miralax is an osmotic laxative.  It only brings more water into the stool.  This is safe to take daily.  Can take up to 17 gram of miralax twice a day.  Mix with juice or coffee.  Start 1 capful at night for 3-4 days and reassess your response in 3-4 days.  You can increase and decrease the dose based on your response.  Remember, it can take up to 3-4 days to take effect OR for the effects to wear off.   I often pair this with benefiber in the morning to help assure the  stool is not too loose.   - Drink at least 64-80 ounces of water/liquid per day. - Establish a time to try to move your bowels every day.  For many people,  this is after a cup of coffee or after a meal such as breakfast. - Sit all of the way back on the toilet keeping your back fairly straight and while sitting up, try to rest the tops of your forearms on your upper thighs.   - Raising your feet with a step stool/squatty potty can be helpful to improve the angle that allows your stool to pass through the rectum. - Relax the rectum feeling it bulge toward the toilet water.  If you feel your rectum raising toward your body, you are contracting rather than relaxing. - Breathe in and slowly exhale. "Belly breath" by expanding your belly towards your belly button. Keep belly expanded as you gently direct pressure down and back to the anus.  A low pitched GRRR sound can assist with increasing intra-abdominal pressure.  (Can also trying to blow on a pinwheel and make it move, this helps with the same belly breathing) - Repeat 3-4 times. If unsuccessful, contract the pelvic floor to restore normal tone and get off the toilet.  Avoid excessive straining. - To reduce excessive wiping by teaching your anus to normally contract, place hands on outer aspect of knees and resist knee movement outward.  Hold 5-10 second then   _______________________________________________________  If your blood pressure at your visit was 140/90 or greater, please contact your primary care physician to follow up on this.  _______________________________________________________  If you are age 71 or older, your body mass index should be between 23-30. Your Body mass index is 26.66 kg/m. If this is out of the aforementioned range listed, please consider follow up with your Primary Care Provider.  If you are age 20 or younger, your body mass index should be between 19-25. Your Body mass index is 26.66 kg/m. If this is out of the aformentioned range listed, please consider follow up with your Primary Care Provider.   ________________________________________________________  The   GI providers would like to encourage you to use [facility] to communicate with providers for non-urgent requests or questions.  Due to long hold times on the telephone, sending your provider a message by [facility] may be a faster and more efficient way to get a response.  Please allow 48 business hours for a response.  Please remember that this is for non-urgent requests.  _______________________________________________________ It was a pleasure to see you today!  Thank you for trusting me with your gastrointestinal care!      Go to the ER if unable to pass gas, severe AB pain, unable to hold down food, any shortness of breath of chest pain.

## 2023-07-04 ENCOUNTER — Other Ambulatory Visit (HOSPITAL_COMMUNITY): Payer: Medicare Other

## 2023-07-04 ENCOUNTER — Encounter: Payer: Medicare Other | Admitting: Gastroenterology

## 2023-07-07 ENCOUNTER — Other Ambulatory Visit: Payer: Self-pay | Admitting: Gastroenterology

## 2023-07-07 ENCOUNTER — Ambulatory Visit (HOSPITAL_COMMUNITY)
Admission: RE | Admit: 2023-07-07 | Discharge: 2023-07-07 | Disposition: A | Discharge status: Home / Self Care | Payer: Medicare Other | Source: Ambulatory Visit | Attending: Gastroenterology | Admitting: Gastroenterology

## 2023-07-07 DIAGNOSIS — R1310 Dysphagia, unspecified: Secondary | ICD-10-CM

## 2023-08-16 ENCOUNTER — Ambulatory Visit: Payer: Medicare Other | Admitting: Gastroenterology

## 2023-08-16 ENCOUNTER — Encounter: Payer: Self-pay | Admitting: Gastroenterology

## 2023-08-16 ENCOUNTER — Telehealth: Payer: Self-pay | Admitting: Gastroenterology

## 2023-08-16 VITALS — BP 121/64 | HR 65 | Temp 98.4°F | Resp 14 | Ht 76.0 in | Wt 219.0 lb

## 2023-08-16 DIAGNOSIS — K2100 Gastro-esophageal reflux disease with esophagitis, without bleeding: Secondary | ICD-10-CM

## 2023-08-16 DIAGNOSIS — D123 Benign neoplasm of transverse colon: Secondary | ICD-10-CM | POA: Diagnosis not present

## 2023-08-16 DIAGNOSIS — R1310 Dysphagia, unspecified: Secondary | ICD-10-CM

## 2023-08-16 DIAGNOSIS — Z85048 Personal history of other malignant neoplasm of rectum, rectosigmoid junction, and anus: Secondary | ICD-10-CM

## 2023-08-16 DIAGNOSIS — Z08 Encounter for follow-up examination after completed treatment for malignant neoplasm: Secondary | ICD-10-CM

## 2023-08-16 DIAGNOSIS — K222 Esophageal obstruction: Secondary | ICD-10-CM

## 2023-08-16 DIAGNOSIS — D120 Benign neoplasm of cecum: Secondary | ICD-10-CM | POA: Diagnosis not present

## 2023-08-16 DIAGNOSIS — K2091 Esophagitis, unspecified with bleeding: Secondary | ICD-10-CM

## 2023-08-16 DIAGNOSIS — K2210 Ulcer of esophagus without bleeding: Secondary | ICD-10-CM | POA: Diagnosis not present

## 2023-08-16 MED ORDER — SODIUM CHLORIDE 0.9 % IV SOLN: 500.0000 mL | INTRAVENOUS | Status: DC

## 2023-08-16 NOTE — Telephone Encounter (Signed)
Patient's wife called after hours tonight. Patient had an EGD with dilation today and a colonoscopy with a few small polyps removed with Dr. Russella Dar. He felt fine post procedure, went home, about an hour after he got home he vomited - no blood, felt back to baseline. This evening he developed a low grade fever mid 100. Denies any chest pain, shortness of breath, or further vomiting. Not much appetite, he has not tried eating. Has not had any blood per rectum. No sick contacts. Denies cough or other upper respiratory symptoms.   Reviewed possible causes with patient and wife - given he has no pain, no problems with his breathing, no bleeding, etc, this is reassuring regarding complication of dilation or his procedures. I told them to try some tylenol and see if this breaks low grade fever and see how things go overnight. Quite possible fever is unrelated to his procedure and coincidental. They will monitor for now - if he develops chest pain / abdominal pain, intolerance of PO, rising fever, bleeding symptoms, etc, he will contact me or go to the ED. Otherwise [facility] staff will check up on him in the AM. They agreed.   Veatrice Bourbon

## 2023-08-16 NOTE — Patient Instructions (Signed)
Upper Endoscopy: Follow dilatation diet today - handout given to you today                                Handout on Esophagitis and Esophageal stenosis given to you today                                Handout on Esophageal reflux ( anti reflux measures ) for you to follow long term                                Continue your present medications including your reflux medication  Colonoscopy: Handout on colon polyps given to you today                        Await pathology results on colon polyps removed                            YOU HAD AN ENDOSCOPIC PROCEDURE TODAY AT THE [facility]:   Refer to the procedure report that was given to you for any specific questions about what was found during the examination.  If the procedure report does not answer your questions, please call your gastroenterologist to clarify.  If you requested that your care partner not be given the details of your procedure findings, then the procedure report has been included in a sealed envelope for you to review at your convenience later.  YOU SHOULD EXPECT: Some feelings of bloating in the abdomen. Passage of more gas than usual.  Walking can help get rid of the air that was put into your GI tract during the procedure and reduce the bloating. If you had a lower endoscopy (such as a colonoscopy or flexible sigmoidoscopy) you may notice spotting of blood in your stool or on the toilet paper. If you underwent a bowel prep for your procedure, you may not have a normal bowel movement for a few days.  Please Note:  You might notice some irritation and congestion in your nose or some drainage.  This is from the oxygen used during your procedure.  There is no need for concern and it should clear up in a day or so.  SYMPTOMS TO REPORT IMMEDIATELY:  Following lower endoscopy (colonoscopy or flexible sigmoidoscopy):  Excessive amounts of blood in the stool  Significant tenderness or worsening of abdominal  pains  Swelling of the abdomen that is new, acute  Fever of 100F or higher  Following upper endoscopy (EGD)  Vomiting of blood or coffee ground material  New chest pain or pain under the shoulder blades  Painful or persistently difficult swallowing  New shortness of breath  Fever of 100F or higher  Black, tarry-looking stools  For urgent or emergent issues, a gastroenterologist can be reached at any hour by calling [phone]. Do not use MyChart messaging for urgent concerns.    DIET:  We do recommend a small meal at first, but then you may proceed to your regular diet.  Drink plenty of fluids but you should avoid alcoholic beverages for 24 hours.  ACTIVITY:  You should plan to take it easy for the rest of today and you should NOT DRIVE or use heavy machinery until  tomorrow (because of the sedation medicines used during the test).    FOLLOW UP: Our staff will call the number listed on your records the next business day following your procedure.  We will call around 7:15- 8:00 am to check on you and address any questions or concerns that you may have regarding the information given to you following your procedure. If we do not reach you, we will leave a message.     If any biopsies were taken you will be contacted by phone or by letter within the next 1-3 weeks.  Please call us at [phone] if you have not heard about the biopsies in 3 weeks.    SIGNATURES/CONFIDENTIALITY: You and/or your care partner have signed paperwork which will be entered into your electronic medical record.  These signatures attest to the fact that that the information above on your After Visit Summary has been reviewed and is understood.  Full responsibility of the confidentiality of this discharge information lies with you and/or your care-partner.

## 2023-08-16 NOTE — Progress Notes (Signed)
Vss nad trans to pacu 

## 2023-08-16 NOTE — Op Note (Signed)
[facility] Patient Name: Jermaine Smith Procedure Date: 08/16/2023 1:41 PM MRN: [id number] Endoscopist: Meryl Dare , MD, [id number] Age: 72 Referring MD:  Date of Birth: 1951-05-05 Gender: Male Account #: [id number] Procedure:                Upper GI endoscopy Indications:              Dysphagia, Abnormal esophagram, GERD Medicines:                Monitored Anesthesia Care Procedure:                Pre-Anesthesia Assessment:                           - Prior to the procedure, a History and Physical                            was performed, and patient medications and                            allergies were reviewed. The patient's tolerance of                            previous anesthesia was also reviewed. The risks                            and benefits of the procedure and the sedation                            options and risks were discussed with the patient.                            All questions were answered, and informed consent                            was obtained. Prior Anticoagulants: The patient has                            taken no anticoagulant or antiplatelet agents. ASA                            Grade Assessment: II - A patient with mild systemic                            disease. After reviewing the risks and benefits,                            the patient was deemed in satisfactory condition to                            undergo the procedure.                           After obtaining informed consent, the endoscope was  passed under direct vision. Throughout the                            procedure, the patient's blood pressure, pulse, and                            oxygen saturations were monitored continuously. The                            Olympus Scope [id number] was introduced through the                            mouth, and advanced to the second part of duodenum.                            The upper GI  endoscopy was accomplished without                            difficulty. The patient tolerated the procedure                            well. Scope In: Scope Out: Findings:                 LA Grade C (one or more mucosal breaks continuous                            between tops of 2 or more mucosal folds, less than                            75% circumference) esophagitis with no bleeding was                            found in the distal esophagus. Biopsies were taken                            with a cold forceps for histology.                           One benign-appearing, intrinsic mild stenosis was                            found at the gastroesophageal junction. This                            stenosis measured 1.4 cm (inner diameter) x less                            than one cm (in length). The stenosis was                            traversed. A guidewire was placed and the scope was  withdrawn. Dilation was performed with a Savary                            dilator with mild resistance at 17 mm.                           The exam of the esophagus was otherwise normal.                           A medium-sized hiatal hernia was present.                           The gastroesophageal flap valve was visualized                            endoscopically and classified as Hill Grade IV (no                            fold, wide open lumen, hiatal hernia present).                           The exam of the stomach was otherwise normal.                           The duodenal bulb and second portion of the                            duodenum were normal. Complications:            No immediate complications. Estimated Blood Loss:     Estimated blood loss was minimal. Impression:               - LA Grade C reflux esophagitis with no bleeding.                            Biopsied.                           - Benign-appearing esophageal stenosis. Dilated.                            - Medium-sized hiatal hernia.                           - Gastroesophageal flap valve classified as Hill                            Grade IV (no fold, wide open lumen, hiatal hernia                            present).                           - Normal duodenal bulb and second portion of the  duodenum. Recommendation:           - Patient has a contact number available for                            emergencies. The signs and symptoms of potential                            delayed complications were discussed with the                            patient. Return to normal activities tomorrow.                            Written discharge instructions were provided to the                            patient.                           - Clear liquid diet for 2 hours, then advance as                            tolerated to soft diet today.                           - Resume prior diet tomorrow.                           - Follow antireflux measures long term.                           - Continue present medications including PPI qd.                           - Await pathology results. Meryl Dare, MD 08/16/2023 2:22:19 PM This report has been signed electronically.

## 2023-08-16 NOTE — Progress Notes (Signed)
History & Physical  Primary Care Physician:  Elias Else, MD (Inactive) Primary Gastroenterologist: Claudette Head, MD  Impression / Plan:  Personal history of rectal cancer, dysphagia and an abnormal barium esophagram for colonoscopy and EGD.  CHIEF COMPLAINT: Dysphagia, abnormal barium esophagram, personal history of rectal cancer  HPI: Jermaine Smith is a 72 y.o. male with a personal history of rectal cancer, dysphagia and an abnormal barium esophagram for colonoscopy and EGD.   Past Medical History:  Diagnosis Date   FHx: brain aneurysm    GERD (gastroesophageal reflux disease)    Hiatal hernia    Left inguinal hernia    Rectal cancer (HCC)    Skin cancer    hx basal cell carcinoma 08/31/17 pt denies/AW    Past Surgical History:  Procedure Laterality Date   CLAVICLE SURGERY     EUS N/A 03/25/2016   Procedure: LOWER ENDOSCOPIC ULTRASOUND (EUS);  Surgeon: Rachael Fee, MD;  Location: [facility];  Service: Endoscopy;  Laterality: N/A;   INGUINAL HERNIA REPAIR  1961   open right inguinal age 33 y/o   lap bilateral ing. hernia repair Bilateral 05/09/12   XI ROBOTIC ASSISTED LOWER ANTERIOR RESECTION N/A 08/04/2016   Procedure: XI ROBOTIC ASSISTED LOWER ANTERIOR RESECTION WITH RIGID PROCTOSCOPY;  Surgeon: Romie Levee, MD;  Location: [facility];  Service: General;  Laterality: N/A;    Prior to Admission medications   Medication Sig Start Date End Date Taking? Authorizing Provider  aspirin 81 MG tablet Take 81 mg by mouth every morning.    Yes [provider]  Cholecalciferol (VITAMIN D PO) Take 1 tablet by mouth daily. 1000 units   Yes [provider]  dexlansoprazole (DEXILANT) 60 MG capsule Take 60 mg by mouth every morning.   Yes [provider]  esomeprazole (NEXIUM) 40 MG capsule Take 40 mg by mouth every other day.   Yes [provider]  Multiple Vitamin (MULTIVITAMIN) tablet Take 1 tablet by mouth every morning.    Yes [provider]  FIBER SELECT GUMMIES PO Take by mouth.    [provider]    Current Outpatient Medications  Medication Sig Dispense Refill   aspirin 81 MG tablet Take 81 mg by mouth every morning.      Cholecalciferol (VITAMIN D PO) Take 1 tablet by mouth daily. 1000 units     dexlansoprazole (DEXILANT) 60 MG capsule Take 60 mg by mouth every morning.     esomeprazole (NEXIUM) 40 MG capsule Take 40 mg by mouth every other day.     Multiple Vitamin (MULTIVITAMIN) tablet Take 1 tablet by mouth every morning.      FIBER SELECT GUMMIES PO Take by mouth.     Current Facility-Administered Medications  Medication Dose Route Frequency Provider Last Rate Last Admin   0.9 %  sodium chloride infusion  500 mL Intravenous Continuous Meryl Dare, MD        Allergies as of 08/16/2023   (No Known Allergies)    Family History  Problem Relation Age of Onset   Clotting disorder Mother        PE   Hypotension Mother    Heart disease Father    Colon cancer Neg Hx    Rectal cancer Neg Hx    Stomach cancer Neg Hx    Esophageal cancer Neg Hx     Social History   Socioeconomic History   Marital status: Married    Spouse name: Not on file  Number of children: 2   Years of education: Not on file   Highest education level: Not on file  Occupational History   Occupation: endoscope specialist    Employer: [employer]    Comment: Works on Biomedical engineer repair/devt  Tobacco Use   Smoking status: Former    Current packs/day: 0.00    Average packs/day: 0.5 packs/day for 5.0 years (2.5 ttl pk-yrs)    Types: Cigarettes    Start date: 09/03/1973    Quit date: 09/03/1978    Years since quitting: 44.9   Smokeless tobacco: Never  Vaping Use   Vaping status: Never Used  Substance and Sexual Activity   Alcohol use: Yes    Alcohol/week: 0.0 standard drinks of alcohol    Comment: ocassional, once a month    Drug use: No    Comment: still smoking 1ppd,had quit in  1978,    Sexual activity: Not on file  Other Topics Concern   Not on file  Social History Narrative   Married, wife Liborio Nixon   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   Physical Exam:  General:  Alert, well-developed, in NAD Head:  Normocephalic and atraumatic. Eyes:  Sclera clear, no icterus.   Conjunctiva pink. Ears:  Normal auditory acuity. Mouth:  No deformity or lesions.  Neck:  Supple; no masses. Lungs:  Clear throughout to auscultation.   No wheezes, crackles, or rhonchi.  Heart:  Regular rate and rhythm; no murmurs. Abdomen:  Soft, nondistended, nontender. No masses, hepatomegaly. No palpable masses.  Normal bowel sounds.    Rectal:  Deferred   Msk:  Symmetrical without gross deformities. Extremities:  Without edema. Neurologic:  Alert and  oriented x 4; grossly normal neurologically. Skin:  Intact without significant lesions or rashes. Psych:  Alert and cooperative. Normal mood and affect.   Venita Lick. Russella Dar  08/16/2023, 1:42 PM See Loretha Stapler, [facility], to contact our on call provider

## 2023-08-16 NOTE — Op Note (Signed)
[facility] Patient Name: Jermaine Smith Procedure Date: 08/16/2023 1:42 PM MRN: [id number] Endoscopist: Meryl Dare , MD, [phone] Age: 71 Referring MD:  Date of Birth: August 17, 1951 Gender: Male Account #: [id number] Procedure:                Colonoscopy Indications:              High risk colon cancer surveillance: Personal                            history of colorectal cancer Medicines:                Monitored Anesthesia Care Procedure:                Pre-Anesthesia Assessment:                           - Prior to the procedure, a History and Physical                            was performed, and patient medications and                            allergies were reviewed. The patient's tolerance of                            previous anesthesia was also reviewed. The risks                            and benefits of the procedure and the sedation                            options and risks were discussed with the patient.                            All questions were answered, and informed consent                            was obtained. Prior Anticoagulants: The patient has                            taken no anticoagulant or antiplatelet agents. ASA                            Grade Assessment: II - A patient with mild systemic                            disease. After reviewing the risks and benefits,                            the patient was deemed in satisfactory condition to                            undergo the procedure.  After obtaining informed consent, the colonoscope                            was passed under direct vision. Throughout the                            procedure, the patient's blood pressure, pulse, and                            oxygen saturations were monitored continuously. The                            CF [id number] #[id number] was introduced through the anus                            and advanced to the the cecum,  identified by                            appendiceal orifice and ileocecal valve. The                            ileocecal valve, appendiceal orifice, and rectum                            were photographed. The quality of the bowel                            preparation was adequate after substantial lavage,                            suction. The colonoscopy was performed without                            difficulty. The patient tolerated the procedure                            well. Unable to retroflex due to a narrow rectal                            vault. The photo of the cecum and cecal polyp did                            not capture. Scope In: 1:47:03 PM Scope Out: 1:59:20 PM Scope Withdrawal Time: 0 hours 9 minutes 51 seconds  Total Procedure Duration: 0 hours 12 minutes 17 seconds  Findings:                 The perianal and digital rectal examinations were                            normal.                           Two sessile polyps were found in the transverse  colon and cecum. The polyps were 6 to 8 mm in size.                            These polyps were removed with a cold snare.                            Resection and retrieval were complete.                           There was evidence of a prior end-to-end                            colo-colonic anastomosis in the proximal rectum.                            This was patent and was characterized by healthy                            appearing mucosa. The anastomosis was traversed.                           The exam was otherwise without abnormality on                            direct views. Complications:            No immediate complications. Estimated blood loss:                            None. Estimated Blood Loss:     Estimated blood loss: none. Impression:               - Two 6 to 8 mm polyps in the transverse colon and                            in the cecum, removed with a cold  snare. Resected                            and retrieved.                           - Patent end-to-end colo-colonic anastomosis,                            characterized by healthy appearing mucosa.                           - The examination was otherwise normal on direct                            views. Recommendation:           - Repeat colonoscopy, likely 5 years, after studies                            are complete for surveillance  based on pathology                            results.                           - Patient has a contact number available for                            emergencies. The signs and symptoms of potential                            delayed complications were discussed with the                            patient. Return to normal activities tomorrow.                            Written discharge instructions were provided to the                            patient.                           - Resume previous diet.                           - Continue present medications.                           - Await pathology results. Meryl Dare, MD 08/16/2023 2:16:40 PM This report has been signed electronically.

## 2023-08-16 NOTE — Progress Notes (Signed)
Pt's states no medical or surgical changes since previsit or office visit. 

## 2023-08-17 ENCOUNTER — Telehealth: Payer: Self-pay | Admitting: *Deleted

## 2023-08-17 NOTE — Telephone Encounter (Signed)
Please check on him this morning and let me know. Thanks.

## 2023-08-17 NOTE — Telephone Encounter (Signed)
Made a second attempted to call and check on the pt. No answer. Left message for pt to call number provided on AVS if he needs to speak to MD or report any further symptoms.

## 2023-08-17 NOTE — Telephone Encounter (Signed)
  Follow up Call-     08/16/2023   12:52 PM  Call back number  Post procedure Call Back phone  # [phone]  Permission to leave phone message Yes     Patient questions:   Message to call us if necessary.

## 2023-08-22 ENCOUNTER — Encounter: Payer: Self-pay | Admitting: Gastroenterology

## 2023-08-22 LAB — SURGICAL PATHOLOGY

## 2023-09-14 DIAGNOSIS — Z23 Encounter for immunization: Secondary | ICD-10-CM | POA: Diagnosis not present

## 2023-11-02 DIAGNOSIS — Z23 Encounter for immunization: Secondary | ICD-10-CM | POA: Diagnosis not present
